# Patient Record
Sex: Male | Born: 1965
Health system: Southern US, Community
[De-identification: ages and names within clinical notes are randomized; demographics above are authoritative.]

## PROBLEM LIST (undated history)

## (undated) DIAGNOSIS — G35D Multiple sclerosis, unspecified: Secondary | ICD-10-CM

## (undated) DIAGNOSIS — G709 Myoneural disorder, unspecified: Secondary | ICD-10-CM

## (undated) DIAGNOSIS — G35 Multiple sclerosis: Secondary | ICD-10-CM

## (undated) DIAGNOSIS — E349 Endocrine disorder, unspecified: Secondary | ICD-10-CM

## (undated) DIAGNOSIS — Q9989 Other specified chromosome abnormalities: Secondary | ICD-10-CM

## (undated) DIAGNOSIS — R011 Cardiac murmur, unspecified: Secondary | ICD-10-CM

## (undated) DIAGNOSIS — Z9889 Other specified postprocedural states: Secondary | ICD-10-CM

## (undated) DIAGNOSIS — Q998 Other specified chromosome abnormalities: Secondary | ICD-10-CM

## (undated) DIAGNOSIS — G4730 Sleep apnea, unspecified: Secondary | ICD-10-CM

## (undated) HISTORY — DX: Other specified postprocedural states: Z98.89

## (undated) HISTORY — DX: Other specified chromosome abnormalities: Q99.89

## (undated) HISTORY — DX: Endocrine disorder, unspecified: E34.9

## (undated) HISTORY — DX: Cardiac murmur, unspecified: R01.1

## (undated) HISTORY — DX: Multiple sclerosis: G35

## (undated) HISTORY — DX: Multiple sclerosis, unspecified: G35.D

## (undated) HISTORY — DX: Other specified chromosome abnormalities: Q99.8

## (undated) HISTORY — DX: Sleep apnea, unspecified: G47.30

---
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
## 2006-05-04 ENCOUNTER — Ambulatory Visit: Payer: Self-pay | Admitting: Family Medicine

## 2010-04-24 ENCOUNTER — Ambulatory Visit
Admission: RE | Admit: 2010-04-24 | Discharge: 2010-04-24 | Payer: Self-pay | Source: Home / Self Care | Attending: Family Medicine | Admitting: Family Medicine

## 2011-10-06 ENCOUNTER — Ambulatory Visit (INDEPENDENT_AMBULATORY_CARE_PROVIDER_SITE_OTHER): Payer: BC Managed Care – PPO | Admitting: Family Medicine

## 2011-10-06 ENCOUNTER — Encounter: Payer: Self-pay | Admitting: Family Medicine

## 2011-10-06 VITALS — BP 110/60 | HR 76 | Ht 69.0 in | Wt 167.0 lb

## 2011-10-06 DIAGNOSIS — E291 Testicular hypofunction: Secondary | ICD-10-CM | POA: Insufficient documentation

## 2011-10-06 DIAGNOSIS — Z0000 Encounter for general adult medical examination without abnormal findings: Secondary | ICD-10-CM

## 2011-10-06 LAB — PSA: PSA: 0.41 ng/mL (ref <=4.00)

## 2011-10-06 LAB — COMPREHENSIVE METABOLIC PANEL
ALT: 10 U/L (ref 0–53)
AST: 13 U/L (ref 0–37)
Calcium: 9.4 mg/dL (ref 8.4–10.5)
Chloride: 104 mEq/L (ref 96–112)
Creat: 0.88 mg/dL (ref 0.50–1.35)
Potassium: 4.8 mEq/L (ref 3.5–5.3)

## 2011-10-06 LAB — CBC WITH DIFFERENTIAL/PLATELET
Basophils Absolute: 0 10*3/uL (ref 0.0–0.1)
Eosinophils Relative: 2 % (ref 0–5)
Lymphocytes Relative: 35 % (ref 12–46)
Neutro Abs: 3.6 10*3/uL (ref 1.7–7.7)
Platelets: 269 10*3/uL (ref 150–400)
RDW: 13.5 % (ref 11.5–15.5)
WBC: 6.9 10*3/uL (ref 4.0–10.5)

## 2011-10-06 LAB — LIPID PANEL
LDL Cholesterol: 135 mg/dL — ABNORMAL HIGH (ref 0–99)
Total CHOL/HDL Ratio: 4.7 Ratio
VLDL: 24 mg/dL (ref 0–40)

## 2011-10-06 LAB — TESTOSTERONE: Testosterone: 516.5 ng/dL (ref 300–890)

## 2011-10-06 LAB — HEMOCCULT GUIAC POC 1CARD (OFFICE)

## 2011-10-06 MED ORDER — TESTOSTERONE 20.25 MG/ACT (1.62%) TD GEL: 3.0000 | Freq: Every day | TRANSDERMAL | Status: DC

## 2011-10-06 NOTE — Progress Notes (Signed)
Subjective:    Patient ID: [name] [name], male    DOB: 06/29/1965, 46 y.o.   MRN: [id number]  HPI He is here for a complete examination. He presently is on testosterone replacement. He apparently had a workup at [facility]. There was a question of Klinefelter's syndrome over I do not have all the data present. They did a workup including pituitary and place him on testosterone replacement. Interestingly he has had bilateral hernia repair. He also gives a history for possible undescended testes on the left. He has no children. He has no other concerns or complaints. His social and family history were reviewed. His marriage is going well. He recently bought a bicycle and does plan to get more physically active.   Review of Systems  Constitutional: Negative.   HENT: Negative.   Eyes: Negative.   Respiratory: Negative.   Cardiovascular: Negative.   Gastrointestinal: Negative.   Genitourinary: Negative.   Musculoskeletal: Negative.   Skin: Negative.   Neurological: Negative.   Hematological: Negative.   Psychiatric/Behavioral: Negative.        Objective:   Physical Exam BP 110/60  Pulse 76  Ht 5\' 9"  (1.753 m)  Wt 167 lb (75.751 kg)  BMI 24.66 kg/m2  SpO2 98%  General Appearance:    Alert, cooperative, no distress, appears stated age  Head:    Normocephalic, without obvious abnormality, atraumatic  Eyes:    PERRL, conjunctiva/corneas clear, EOM's intact, fundi    benign  Ears:    Normal TM's and external ear canals  Nose:   Nares normal, mucosa normal, no drainage or sinus   tenderness  Throat:   Lips, mucosa, and tongue normal; teeth and gums normal  Neck:   Supple, no lymphadenopathy;  thyroid:  no   enlargement/tenderness/nodules; no carotid   bruit or JVD  Back:    Spine nontender, no curvature, ROM normal, no CVA     tenderness  Lungs:     Clear to auscultation bilaterally without wheezes, rales or     ronchi; respirations unlabored  Chest Wall:    No tenderness or  deformity   Heart:    Regular rate and rhythm, S1 and S2 normal, no murmur, rub   or gallop  Breast Exam:    No chest wall tenderness, masses or gynecomastia  Abdomen:     Soft, non-tender, nondistended, normoactive bowel sounds,    no masses, no hepatosplenomegaly  Genitalia:    Normal male external genitalia without lesions.  Testicles show questionable absence on the left and quite remarkable atrophy on the right.  No inguinal hernias.  Rectal:    Normal sphincter tone, no masses or tenderness; guaiac negative stool.  Prostate smooth, no nodules, not enlarged.  Extremities:   No clubbing, cyanosis or edema  Pulses:   2+ and symmetric all extremities  Skin:   Skin color, texture, turgor normal, no rashes or lesions  Lymph nodes:   Cervical, supraclavicular, and axillary nodes normal  Neurologic:   CNII-XII intact, normal strength, sensation and gait; reflexes 2+ and symmetric throughout          Psych:   Normal mood, affect, hygiene and grooming.           Assessment & Plan:   1. Routine general medical examination at a health care facility  CBC with Differential, Comprehensive metabolic panel, Lipid panel, Hemoccult - 1 Card (office)  2. Hypogonadism male  Testosterone, Testosterone (ANDROGEL PUMP) 20.25 MG/ACT (1.62%) GEL, PSA   I will  get a release and send it to [facility] to get a complete workup

## 2011-10-13 ENCOUNTER — Telehealth: Payer: Self-pay | Admitting: Family Medicine

## 2011-10-13 DIAGNOSIS — E291 Testicular hypofunction: Secondary | ICD-10-CM

## 2011-10-13 MED ORDER — TESTOSTERONE 20.25 MG/ACT (1.62%) TD GEL: 3.0000 | Freq: Every day | TRANSDERMAL | Status: DC

## 2011-10-13 NOTE — Telephone Encounter (Signed)
FAXED RX TO [facility]

## 2011-11-04 ENCOUNTER — Encounter: Payer: Self-pay | Admitting: Internal Medicine

## 2011-11-04 DIAGNOSIS — E349 Endocrine disorder, unspecified: Secondary | ICD-10-CM

## 2011-11-04 HISTORY — DX: Endocrine disorder, unspecified: E34.9

## 2012-04-14 HISTORY — PX: LAPAROSCOPIC CHOLECYSTECTOMY: SUR755

## 2012-08-30 ENCOUNTER — Encounter: Payer: Self-pay | Admitting: Family Medicine

## 2012-08-30 ENCOUNTER — Ambulatory Visit (INDEPENDENT_AMBULATORY_CARE_PROVIDER_SITE_OTHER): Payer: BC Managed Care – PPO | Admitting: Family Medicine

## 2012-08-30 VITALS — BP 110/60 | HR 68 | Ht 69.5 in | Wt 174.0 lb

## 2012-08-30 DIAGNOSIS — Z0000 Encounter for general adult medical examination without abnormal findings: Secondary | ICD-10-CM

## 2012-08-30 DIAGNOSIS — Z125 Encounter for screening for malignant neoplasm of prostate: Secondary | ICD-10-CM

## 2012-08-30 DIAGNOSIS — R29898 Other symptoms and signs involving the musculoskeletal system: Secondary | ICD-10-CM

## 2012-08-30 DIAGNOSIS — E291 Testicular hypofunction: Secondary | ICD-10-CM

## 2012-08-30 LAB — CBC WITH DIFFERENTIAL/PLATELET
Eosinophils Relative: 2 % (ref 0–5)
HCT: 41.1 % (ref 39.0–52.0)
Hemoglobin: 13.8 g/dL (ref 13.0–17.0)
Lymphocytes Relative: 30 % (ref 12–46)
Lymphs Abs: 2 10*3/uL (ref 0.7–4.0)
MCV: 87.3 fL (ref 78.0–100.0)
Monocytes Absolute: 0.9 10*3/uL (ref 0.1–1.0)
Monocytes Relative: 13 % — ABNORMAL HIGH (ref 3–12)
RBC: 4.71 MIL/uL (ref 4.22–5.81)
WBC: 6.9 10*3/uL (ref 4.0–10.5)

## 2012-08-30 NOTE — Progress Notes (Signed)
Subjective:    Patient ID: [name] [name], male    DOB: 28-Jul-1965, 47 y.o.   MRN: [id number]  HPI He is here for complete examination. His main complaint is having a one-year history of increasing difficulty with weakness in both lower is especially with exercise. When he stops exercising, the symptoms go away in roughly 5-10 minutes. He also states that heat will make him feel weak. He also notes that eating carbohydrates will cause a tingling sensation in his right leg. He's had no chest pain, shortness of breath, numbness. He does have an underlying history of hypogonadism and was evaluated at [facility]inology. There is a question of XY Y. Chromosome abnormality. Review of records shows no evidence of that. Otherwise he has no concerns or complaints.   Review of Systems  Constitutional: Positive for activity change and fatigue.  HENT: Negative.   Eyes: Negative.   Respiratory: Negative.   Cardiovascular: Negative.   Gastrointestinal: Negative.   Endocrine: Negative.   Genitourinary: Negative.   Allergic/Immunologic: Negative.   Neurological: Negative.   Hematological: Negative.   Psychiatric/Behavioral: Negative.        Objective:   Physical Exam BP 110/60  Pulse 68  Ht 5' 9.5" (1.765 m)  Wt 174 lb (78.926 kg)  BMI 25.34 kg/m2  General Appearance:    Alert, cooperative, no distress, appears stated age  Head:    Normocephalic, without obvious abnormality, atraumatic  Eyes:    PERRL, conjunctiva/corneas clear, EOM's intact, fundi    benign  Ears:    Normal TM's and external ear canals  Nose:   Nares normal, mucosa normal, no drainage or sinus   tenderness  Throat:   Lips, mucosa, and tongue normal; teeth and gums normal  Neck:   Supple, no lymphadenopathy;  thyroid:  no   enlargement/tenderness/nodules; no carotid   bruit or JVD  Back:    Spine nontender, no curvature, ROM normal, no CVA     tenderness  Lungs:     Clear to auscultation bilaterally without  wheezes, rales or     ronchi; respirations unlabored  Chest Wall:    No tenderness or deformity   Heart:    Regular rate and rhythm, S1 and S2 normal, no murmur, rub   or gallop  Breast Exam:    No chest wall tenderness, masses or gynecomastia  Abdomen:     Soft, non-tender, nondistended, normoactive bowel sounds,    no masses, no hepatosplenomegaly  Genitalia:    Normal male external genitalia without lesions.  Testicles are quite atrophied.  No inguinal hernias.  Rectal:    Normal sphincter tone, no masses or tenderness; guaiac negative stool.  Prostate smooth, no nodules, not enlarged.  Extremities:   No clubbing, cyanosis or edema  Pulses:   2+ and symmetric all extremities  Skin:   Skin color, texture, turgor normal, no rashes or lesions  Lymph nodes:   Cervical, supraclavicular, and axillary nodes normal  Neurologic:   CNII-XII intact, normal strength, sensation and gait; reflexes 2+ and symmetric throughout          Psych:   Normal mood, affect, hygiene and grooming.          Assessment & Plan:  Routine general medical examination at a health care facility - Plan: CBC with Differential, Comprehensive metabolic panel, Lipid panel, Fecal Occult Blood, Guaiac, CANCELED: POCT urinalysis dipstick  Hypogonadism male - Plan: Testosterone, PSA  Weakness of both legs - Plan: TSH  Special screening for  malignant neoplasm of prostate - Plan: PSA L. Get a release form to look for chromosome evaluation. Routine blood screening. May possibly need to do further vascular workup.

## 2012-08-31 ENCOUNTER — Other Ambulatory Visit: Payer: Self-pay

## 2012-08-31 DIAGNOSIS — R29898 Other symptoms and signs involving the musculoskeletal system: Secondary | ICD-10-CM

## 2012-08-31 LAB — LIPID PANEL
Cholesterol: 207 mg/dL — ABNORMAL HIGH (ref 0–200)
Triglycerides: 107 mg/dL (ref <150)
VLDL: 21 mg/dL (ref 0–40)

## 2012-08-31 LAB — COMPREHENSIVE METABOLIC PANEL
ALT: 14 U/L (ref 0–53)
Albumin: 4.5 g/dL (ref 3.5–5.2)
CO2: 29 mEq/L (ref 19–32)
Chloride: 105 mEq/L (ref 96–112)
Glucose, Bld: 88 mg/dL (ref 70–99)
Potassium: 4.7 mEq/L (ref 3.5–5.3)
Sodium: 141 mEq/L (ref 135–145)
Total Protein: 7 g/dL (ref 6.0–8.3)

## 2012-08-31 LAB — TSH: TSH: 1.791 u[IU]/mL (ref 0.350–4.500)

## 2012-08-31 LAB — PSA: PSA: 0.41 ng/mL (ref <=4.00)

## 2012-08-31 NOTE — Progress Notes (Signed)
Quick Note:  PT INFORMED OF LABS AND THAT I HAVE FAXED OVER REQUEST FOR BI/LAT LOWER EXTREMITY ARTERIAL DOPPLER ALONG WITH U/S OF LOWER AORTA AND ILIACS ______

## 2012-09-01 ENCOUNTER — Encounter: Payer: Self-pay | Admitting: Cardiovascular Disease

## 2012-09-01 ENCOUNTER — Encounter (HOSPITAL_COMMUNITY): Payer: Self-pay | Admitting: Cardiovascular Disease

## 2012-09-02 ENCOUNTER — Encounter: Payer: Self-pay | Admitting: Family Medicine

## 2012-09-02 ENCOUNTER — Other Ambulatory Visit (HOSPITAL_COMMUNITY): Payer: Self-pay | Admitting: Family Medicine

## 2012-09-10 ENCOUNTER — Encounter (HOSPITAL_COMMUNITY): Payer: BC Managed Care – PPO

## 2012-09-17 ENCOUNTER — Ambulatory Visit (HOSPITAL_COMMUNITY)
Admission: RE | Admit: 2012-09-17 | Discharge: 2012-09-17 | Disposition: A | Discharge status: Home / Self Care | Payer: BC Managed Care – PPO | Source: Ambulatory Visit | Attending: Cardiology | Admitting: Cardiology

## 2012-09-17 DIAGNOSIS — R29898 Other symptoms and signs involving the musculoskeletal system: Secondary | ICD-10-CM | POA: Insufficient documentation

## 2012-09-17 NOTE — Progress Notes (Signed)
Arterial Duplex Completed. Negative. Erlene Quan

## 2012-10-12 DIAGNOSIS — G709 Myoneural disorder, unspecified: Secondary | ICD-10-CM

## 2012-10-12 HISTORY — DX: Myoneural disorder, unspecified: G70.9

## 2012-10-26 ENCOUNTER — Ambulatory Visit (INDEPENDENT_AMBULATORY_CARE_PROVIDER_SITE_OTHER): Payer: BC Managed Care – PPO | Admitting: Family Medicine

## 2012-10-26 ENCOUNTER — Encounter: Payer: Self-pay | Admitting: Family Medicine

## 2012-10-26 ENCOUNTER — Other Ambulatory Visit: Payer: Self-pay

## 2012-10-26 VITALS — BP 116/70 | HR 80 | Temp 98.3°F

## 2012-10-26 DIAGNOSIS — E291 Testicular hypofunction: Secondary | ICD-10-CM

## 2012-10-26 DIAGNOSIS — R42 Dizziness and giddiness: Secondary | ICD-10-CM

## 2012-10-26 MED ORDER — TESTOSTERONE 20.25 MG/ACT (1.62%) TD GEL: 3.0000 | Freq: Every day | TRANSDERMAL | Status: DC

## 2012-10-26 MED ORDER — MECLIZINE HCL 12.5 MG PO TABS: 12.5000 mg | ORAL_TABLET | Freq: Three times a day (TID) | ORAL | Status: DC | PRN

## 2012-10-26 NOTE — Progress Notes (Signed)
  Subjective:    Patient ID: [name] [name], male    DOB: 1965/09/29, 47 y.o.   MRN: [id number]  HPI Yesterday he was feeling weak and dizzy he was able to go to work. He had these symptoms all day long. Today he woke up with more dizziness and weakness with decreased ability to focus. No blurred vision, double vision, nausea or vomiting, weakness. No fever, chills, sore throat, nasal congestion or PND.   He would also like his testosterone renewed. The only medicine he takes is testosterone. He has had no recent sickness this.  Review of Systems     Objective:   Physical Exam alert and in no distress. EOMI cerebellar testing did cause him to fall slightly to the right. DTRs normal. No clonus. Negative pronator drift with normal finger to nose Tympanic membranes and canals are normal. Throat is clear. Tonsils are normal. Neck is supple without adenopathy or thyromegaly. Cardiac exam shows a regular sinus rhythm without murmurs or gallops. Lungs are clear to auscultation.        Assessment & Plan:  Dizziness - Plan: meclizine (ANTIVERT) 12.5 MG tablet, DISCONTINUED: meclizine (ANTIVERT) 12.5 MG tablet  Hypogonadism male - Plan: Testosterone (ANDROGEL PUMP) 20.25 MG/ACT (1.62%) GEL  I will place him on Antivert. He does have only one sign lateralizing. I warned him that if his symptoms worsen, he is to call me.

## 2012-10-26 NOTE — Patient Instructions (Signed)
Use the Antivert as needed but if your symptoms change especially with increased dizziness nausea, vomiting or headache I want to know,

## 2012-10-28 ENCOUNTER — Telehealth: Payer: Self-pay | Admitting: Family Medicine

## 2012-10-28 NOTE — Telephone Encounter (Signed)
Calling him up until and if he is feeling like this in the morning to call and make an appointment to be seen.

## 2012-10-28 NOTE — Telephone Encounter (Signed)
PT INFOMED IF FEELING BAD IN THE MORNING CALL AND GET AN APPOINTMENT

## 2012-11-02 ENCOUNTER — Ambulatory Visit (INDEPENDENT_AMBULATORY_CARE_PROVIDER_SITE_OTHER): Payer: BC Managed Care – PPO | Admitting: Family Medicine

## 2012-11-02 ENCOUNTER — Encounter: Payer: Self-pay | Admitting: Family Medicine

## 2012-11-02 VITALS — BP 122/80 | HR 70

## 2012-11-02 DIAGNOSIS — H532 Diplopia: Secondary | ICD-10-CM

## 2012-11-02 DIAGNOSIS — R42 Dizziness and giddiness: Secondary | ICD-10-CM

## 2012-11-02 NOTE — Progress Notes (Signed)
  Subjective:    Patient ID: [name] [name], male    DOB: [birth year], 47 y.o.   MRN: [id number]  HPI He is here for recheck. He continues had difficulty with dizziness and now also complains of double vision. Prior to this he did have blurred vision. He does have difficulty with nausea but does not note that he falls in one direction or another.   Review of Systems     Objective:   Physical Exam Alert and slightly toxic appearing. EOMI. Other cranial nerves grossly intact. DTRs of upper stream these were 2+ and lower sternum these were 3+ with 2 beat clonus. Cerebellar testing showed him to be unsteady but he did not fall in one direction. Normal finger to nose.       Assessment & Plan:  Vertigo - Plan: MR Brain W Wo Contrast  Double vision - Plan: MR Brain W Wo Contrast  case was discussed with radiology. I think we need to look closely at his sella and the optic chiasm and the appropriate MRI was therefore ordered.

## 2012-11-03 ENCOUNTER — Ambulatory Visit
Admission: RE | Admit: 2012-11-03 | Discharge: 2012-11-03 | Disposition: A | Discharge status: Home / Self Care | Payer: BC Managed Care – PPO | Source: Ambulatory Visit | Attending: Family Medicine | Admitting: Family Medicine

## 2012-11-03 ENCOUNTER — Telehealth: Payer: Self-pay | Admitting: Internal Medicine

## 2012-11-03 ENCOUNTER — Encounter (HOSPITAL_COMMUNITY): Payer: Self-pay | Admitting: General Practice

## 2012-11-03 ENCOUNTER — Observation Stay (HOSPITAL_COMMUNITY)
Admission: AD | Admit: 2012-11-03 | Discharge: 2012-11-05 | Disposition: A | Discharge status: Home / Self Care | Payer: BC Managed Care – PPO | Source: Ambulatory Visit | Attending: Internal Medicine | Admitting: Internal Medicine

## 2012-11-03 DIAGNOSIS — G35 Multiple sclerosis: Principal | ICD-10-CM | POA: Diagnosis present

## 2012-11-03 DIAGNOSIS — E291 Testicular hypofunction: Secondary | ICD-10-CM

## 2012-11-03 DIAGNOSIS — I951 Orthostatic hypotension: Secondary | ICD-10-CM

## 2012-11-03 DIAGNOSIS — H538 Other visual disturbances: Secondary | ICD-10-CM | POA: Insufficient documentation

## 2012-11-03 DIAGNOSIS — H532 Diplopia: Secondary | ICD-10-CM

## 2012-11-03 DIAGNOSIS — R42 Dizziness and giddiness: Secondary | ICD-10-CM

## 2012-11-03 DIAGNOSIS — H8110 Benign paroxysmal vertigo, unspecified ear: Secondary | ICD-10-CM

## 2012-11-03 HISTORY — DX: Myoneural disorder, unspecified: G70.9

## 2012-11-03 LAB — COMPREHENSIVE METABOLIC PANEL
AST: 16 U/L (ref 0–37)
BUN: 22 mg/dL (ref 6–23)
CO2: 29 mEq/L (ref 19–32)
Calcium: 9.5 mg/dL (ref 8.4–10.5)
Chloride: 101 mEq/L (ref 96–112)
Creatinine, Ser: 0.76 mg/dL (ref 0.50–1.35)
GFR calc Af Amer: >90 mL/min (ref >90)
GFR calc non Af Amer: >90 mL/min (ref >90)
Total Bilirubin: 0.4 mg/dL (ref 0.3–1.2)

## 2012-11-03 LAB — TSH: TSH: 1.488 u[IU]/mL (ref 0.350–4.500)

## 2012-11-03 LAB — CBC
Hemoglobin: 14 g/dL (ref 13.0–17.0)
MCH: 29.7 pg (ref 26.0–34.0)
Platelets: 230 10*3/uL (ref 150–400)
RBC: 4.71 MIL/uL (ref 4.22–5.81)
WBC: 8.6 10*3/uL (ref 4.0–10.5)

## 2012-11-03 LAB — HEMOGLOBIN A1C
Hgb A1c MFr Bld: 5.3 % (ref <5.7)
Mean Plasma Glucose: 105 mg/dL (ref <117)

## 2012-11-03 MED ORDER — ONDANSETRON HCL 4 MG/2ML IJ SOLN: 4.0000 mg | Freq: Four times a day (QID) | INTRAMUSCULAR | Status: DC | PRN

## 2012-11-03 MED ORDER — ONDANSETRON HCL 4 MG PO TABS: 4.0000 mg | ORAL_TABLET | Freq: Four times a day (QID) | ORAL | Status: DC | PRN

## 2012-11-03 MED ORDER — ALUM & MAG HYDROXIDE-SIMETH 200-200-20 MG/5ML PO SUSP: 30.0000 mL | Freq: Four times a day (QID) | ORAL | Status: DC | PRN

## 2012-11-03 MED ORDER — ALUM & MAG HYDROXIDE-SIMETH 200-200-20 MG/5ML PO SUSP
30.0000 mL | Freq: Four times a day (QID) | ORAL | Status: DC | PRN
  Filled 2012-11-03: qty 30

## 2012-11-03 MED ORDER — ACETAMINOPHEN 650 MG RE SUPP: 650.0000 mg | Freq: Four times a day (QID) | RECTAL | Status: DC | PRN

## 2012-11-03 MED ORDER — SODIUM CHLORIDE 0.9 % IV SOLN: 250.0000 mL | INTRAVENOUS | Status: DC | PRN

## 2012-11-03 MED ORDER — ACETAMINOPHEN 325 MG PO TABS: 650.0000 mg | ORAL_TABLET | Freq: Four times a day (QID) | ORAL | Status: DC | PRN

## 2012-11-03 MED ORDER — SODIUM CHLORIDE 0.9 % IJ SOLN
3.0000 mL | Freq: Two times a day (BID) | INTRAMUSCULAR | Status: DC
  Administered 2012-11-03 – 2012-11-05 (×3): 3 mL via INTRAVENOUS

## 2012-11-03 MED ORDER — TRAZODONE 25 MG HALF TABLET
25.0000 mg | ORAL_TABLET | Freq: Every evening | ORAL | Status: DC | PRN
  Filled 2012-11-03: qty 1

## 2012-11-03 MED ORDER — SENNOSIDES-DOCUSATE SODIUM 8.6-50 MG PO TABS
1.0000 | ORAL_TABLET | Freq: Every evening | ORAL | Status: DC | PRN
  Filled 2012-11-03: qty 1

## 2012-11-03 MED ORDER — TESTOSTERONE 20.25 MG/ACT (1.62%) TD GEL: 3.0000 | Freq: Every day | TRANSDERMAL | Status: DC

## 2012-11-03 MED ORDER — TESTOSTERONE 50 MG/5GM (1%) TD GEL: 5.0000 g | Freq: Every day | TRANSDERMAL | Status: DC

## 2012-11-03 MED ORDER — TESTOSTERONE 20.25 MG/ACT (1.62%) TD GEL
3.0000 | Freq: Every day | TRANSDERMAL | Status: DC
  Administered 2012-11-04 – 2012-11-05 (×2): 3 via TRANSDERMAL

## 2012-11-03 MED ORDER — SODIUM CHLORIDE 0.9 % IJ SOLN: 3.0000 mL | INTRAMUSCULAR | Status: DC | PRN

## 2012-11-03 MED ORDER — SODIUM CHLORIDE 0.9 % IJ SOLN: 3.0000 mL | Freq: Two times a day (BID) | INTRAMUSCULAR | Status: DC

## 2012-11-03 MED ORDER — GADOBENATE DIMEGLUMINE 529 MG/ML IV SOLN
16.0000 mL | Freq: Once | INTRAVENOUS | Status: AC | PRN
  Administered 2012-11-03: 16 mL via INTRAVENOUS

## 2012-11-03 MED ORDER — DIAZEPAM 2 MG PO TABS: 1.0000 mg | ORAL_TABLET | Freq: Four times a day (QID) | ORAL | Status: DC | PRN

## 2012-11-03 NOTE — H&P (Signed)
Addendum  Patient seen and examined, chart and data base reviewed.  I agree with the above assessment and plan.  For full details please see Mrs. Algis Downs PA note.  Dizziness, double/blurry vision with MRI findings consistent with MS, Neuro to see.  PT to evaluate the patient for her significant dizziness.   Clint Lipps, MD [facility] Pager: [phone] 11/03/2012, 5:15 PM

## 2012-11-03 NOTE — Progress Notes (Signed)
Quick Note:  I informed the patient of the diagnosis. Discussed a direct admit with Dr. Arthor Captain who will accept him. Call admission this and asked for a telemetry bed and he will be admitted to Dr. Harriet Pho service. ______

## 2012-11-03 NOTE — Telephone Encounter (Signed)
PENDING ACCEPTANCE TRANFER NOTE:  Call received from:    Sharlot Gowda  REASON FOR REQUESTING TRANSFER:   MS flare  HPI:   Presented to his primary care physician office with blurry and double vision. MRI was done and showed findings consistent with chronic MS, patient does not have history of MS (or at least diagnosed multiple sclerosis). Patient will be admitted for MS flareup, likely will need a neurology consultation on admission.   PLAN:  According to telephone report, this patient was accepted for transfer to [facility],   Under [facility] team:  10,  I have requested an order be written to call Flow Manager at [phone] upon patient arrival to the floor for final physician assignment who will do the admission and give admitting orders.  SIGNED: Clint Lipps, MD [facility]  11/03/2012, 11:26 AM

## 2012-11-03 NOTE — H&P (Signed)
[facility] History and Physical  [name] [name] XBJ:[id number] DOB: 1965/06/12 DOA: 11/03/2012  Referring physician: Dr. Susann Givens PCP: Carollee Herter, MD  Specialists:   Chief Complaint: Double vision  HPI: [name] [name] is a 47 y.o. male with a history of low testosterone and a questionable history of hyperglycemia.  He gives a history of intermittent weakness over the past 12 years.  This weakness would resolve with a brief 5 - 10 min rest.  Approximately 1 year ago he was helping to unload a moving truck and noticed that his extremities would collapse but would return to normal with a brief rest.  Over the past two weeks he has felt abnormal.  He has developed right leg intermittent twitching that is worse after he eats, he felt very weak, and his vision became blurry.  He saw his PCP who started him on meclizine.  On Friday afternoon 7/18 he began to have double vision and worsening blurry vision.  He became too nauseated to walk.  He was seen again in his PCP's office on 7/22 and MRI was ordered.  The MRI shows chonic multiple sclerosis.  His PCP called for direct admission.    Review of Systems: + for decreased appetite, + for shortness of breath (the feeling of being unable to take a deep breath).  Denies numbness, denies bowel or bladder incontinence, He denies CP, AP, Vomiting, diarrhea, fever, cough, congestion, lymphadenopathy, night sweats.  All other systems reviewed and found to be negative.  Past Medical History  Diagnosis Date  . Testosterone deficiency 11/04/11  . 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 Other postprocedural status(V45.89)     inguinal herniorrhaphies bilateral    No past surgical history on file. Social History:  reports that he quit smoking about 2 years ago. His smoking use included Cigarettes. He smoked 0.00 packs per day. He does not have any smokeless tobacco history on file. He reports that he does not drink  alcohol or use illicit drugs. 30 pack year smoking history.  Quit 2 years ago.  Rare alcohol, has not used recreational drugs since he was in his 55s.  Married, independent of his ADLs.  Allergies  Allergen Reactions  . Penicillins     Full body rash    Mother:  Healthy and in her 21. Father pmh unknown (he lives in [place]).  No family hx of MS, IBD, DM  Prior to Admission medications   Medication Sig Start Date End Date Taking? Authorizing Provider  Testosterone (ANDROGEL PUMP) 20.25 MG/ACT (1.62%) GEL Place 3 Squirts onto the skin daily. 10/26/12  Yes Ronnald Nian, MD   Physical Exam: Filed Vitals:   11/03/12 1300  BP: 112/67  Pulse: 78  Temp: 98 F (36.7 C)  TempSrc: Oral  Resp: 20  SpO2: 98%     General:  Wn, Wd, Male, NAD sitting up in bed.  Eyes: sclera clear, pupils equal and round  ENT: mmm, no exudates, or erythema  Neck: supple, no lymphadenopathy  Cardiovascular: rrr no m/r/g  Respiratory: cta no w/c/r, no accessory muscle use.  Abdomen: thin, soft, nt, nd, +BS  Skin: no rash, bruise, lesion  Musculoskeletal: 5/5 strength in each extremity  Psychiatric: A&O, NAD, Cooperative, affect normal  Neurologic:  Defer to neuro PA    Radiological Exams on Admission: Mr [name] Contrast  11/03/2012   *RADIOLOGY REPORT*  Clinical Data: 47 year old male with diplopia, nausea, headache, dizziness.  MRI HEAD WITHOUT AND WITH CONTRAST  Technique:  Multiplanar, multiecho pulse sequences of the brain and surrounding structures were obtained according to standard protocol without and with intravenous contrast  Contrast: 16mL MULTIHANCE GADOBENATE DIMEGLUMINE 529 MG/ML IV SOLN  Comparison: None.  Findings: Extensive cerebral white matter signal abnormality, with widespread areas of small cystic encephalomalacia.  Sagittal FLAIR images demonstrate lesions generally arranged in a perpendicular pattern to the ventricles.  Thinning of the corpus callosum. Bilateral temporal  lobe involvement.  Scattered involvement of the deep gray matter nuclei, more so the thalami.  Left middle cerebellar peduncle involvement.  Subtle right cerebellar hemisphere involvement.  Dorsal medulla oblongata involvement. Grossly negative visualized cervical spine.  Diffusion weighted images and postcontrast imaging do not demonstrate any enhancing lesions, or lesions with definite restricted diffusion (although a small left lateral thalamic lesion could be mildly restricted height and series 4 image 17 and series 400 image 17).  No areas of cortical encephalomalacia are identified. No restricted diffusion to suggest acute infarction.  No acute or chronic intracranial hemorrhage identified.  Negative pituitary. Major intracranial vascular flow voids are preserved.  Normal bone marrow signal. Visualized orbit soft tissues are within normal limits.  Visualized paranasal sinuses and mastoids are clear.  Grossly normal visualized internal auditory structures. Negative scalp soft tissues.  IMPRESSION: Advanced white matter disease with superimposed changes in the cerebellum and deep gray matter nuclei.  The pattern of disease is most consistent with chronic multiple sclerosis.  No definite acute demyelination.  No other intracranial abnormality identified.   Original Report Authenticated By: Erskine Speed, M.D.    Assessment/Plan Active Problems:   Multiple sclerosis   Multiple sclerosis exacerbation.  Per MRI patient has chronic MS, although this is a new diagnosis for him.  Neuro consult appreciated.  Will admit to observation  Check urine, TSH, and CXR (due to SOB)  Question of hyperglycemia in the past  Check Hgb A1c  Low Testosterone  Being treated as an outpatient  Continue testosterone  Neurology formally consulted.  Code Status: full Family Communication: wife, Brayton Caves, at bedside. Disposition Plan: Observation, likely home 7/24 with neuro follow up.  Time spent: 60  min  Conley Canal [facility] Pager [phone]  If 7PM-7AM, please contact night-coverage [email or web address] Password [facility] 11/03/2012, 3:17 PM

## 2012-11-03 NOTE — Consult Note (Signed)
NEURO HOSPITALIST CONSULT NOTE    Reason for Consult: MS  HPI:                                                                                                                                          [name] [name] is an 47 y.o. male with a new MS diagnosis.  He has a 12 year history of waxing and waning weakness of bilateral legs that resolved with rest and fluids. He has also had right leg abnormal sensations that resolved with exercise. Over the 12 years he had noted periods of generalized weakness. He presented 2 weeks ago, feeling "abnormal" and dizzy.  He saw his PCP, who thought he had swimmers ear and started him on Meclizine, which the patient discontinued because it was not working. He developed diplopia and mild blurry vision in the last week.  His PCP ordered an MRI with contrast today, which showed multiple periventricular lesions consistent with chronic MS, but no enhancing lesions. His PCP called for a direct admission. The patient is still having symptoms of blurry vision and dizziness today. He describes a vertiginous sensation with movement that fatigues and ceases when movement stops.    Past Medical History  Diagnosis Date  . Testosterone deficiency 11/04/11  . 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 Other postprocedural status(V45.89)     inguinal herniorrhaphies bilateral      Social History: 30 pack year history. Quit smoking 2 years ago.  Rarely drinks alcohol and does not use recreational drugs. Lives at home with wife.    Allergies  Allergen Reactions  . Penicillins     unknown    MEDICATIONS:                                                                                                                     Current Facility-Administered Medications  Medication Dose Route Frequency Provider Last Rate Last Dose  . Testosterone 20.25 MG/ACT (1.62%) GEL 3 Squirt  3 Squirt Transdermal Daily Marianne L York, PA-C          ROS:  History obtained from the patient  General ROS: negative for - chills, fatigue, fever, night sweats, weight gain or weight loss Psychological ROS: negative for - behavioral disorder, hallucinations, memory difficulties, mood swings or suicidal ideation Ophthalmic ROS: negative for -eye pain or loss of vision ENT ROS: negative for - tinnitus  Allergy and Immunology ROS: negative for - hives or itchy/watery eyes Hematological and Lymphatic ROS: negative for - bleeding problems, bruising or swollen lymph nodes Endocrine ROS: negative for - galactorrhea, hair pattern changes, polydipsia/polyuria or temperature intolerance Respiratory ROS: negative for - cough, hemoptysis, shortness of breath or wheezing Cardiovascular ROS: negative for - chest pain, dyspnea on exertion, edema or irregular heartbeat Gastrointestinal ROS: negative for - abdominal pain, diarrhea, hematemesis, or stool incontinence Genito-Urinary ROS: negative for - dysuria, hematuria, incontinence or urinary frequency/urgency Musculoskeletal ROS: negative for - joint swelling or muscular weakness Neurological ROS: as noted in HPI Dermatological ROS: negative for rash and skin lesion changes   Blood pressure 112/67, pulse 78, temperature 98 F (36.7 C), temperature source Oral, resp. rate 20, SpO2 98.00%.   Neurologic Examination:                                                                                                       Mental Status: Alert, oriented, thought content appropriate.  Speech fluent without evidence of aphasia.  Able to follow 3 step commands without difficulty. Cranial Nerves: II: Discs flat bilaterally; Visual fields grossly normal, pupils equal, round, reactive to light and accommodation III,IV, VI: ptosis not present, extra-ocular motions intact  bilaterally V,VII: smile symmetric, facial light touch sensation normal bilaterally VIII: hearing normal bilaterally IX,X: gag reflex present XI: bilateral shoulder shrug XII: midline tongue extension Motor: Right : Upper extremity   5/5    Left:     Upper extremity   5/5  Lower extremity   5/5 (note)    Lower extremity   5/5  --right knee flexion 4/5  Tone and bulk:normal tone throughout; no atrophy noted Sensory: Pinprick and light touch intact throughout, bilaterally Deep Tendon Reflexes:  Right: Upper Extremity   Left: Upper extremity   biceps (C-5 to C-6) 2/4   biceps (C-5 to C-6) 2/4 tricep (C7) 2/4    triceps (C7) 2/4 Brachioradialis (C6) 2/4  Brachioradialis (C6) 2/4  Lower Extremity Lower Extremity  quadriceps (L-2 to L-4) 2/4   quadriceps (L-2 to L-4) 2/4 Achilles (S1) 2/4   Achilles (S1) 2/4  Plantars: Right: upgoing   Left: downgoing Cerebellar: normal finger-to-nose,  normal heel-to-shin test Gait: narrow based, negative Romberg  CV: pulses palpable throughout     Lab Results  Component Value Date/Time   CHOL 207* 08/30/2012 11:17 AM    No results found for this or any previous visit (from the past 48 hour(s)).  Mr [name] [name] Wo Contrast  11/03/2012   *RADIOLOGY REPORT*  Clinical Data: 47 year old male with diplopia, nausea, headache, dizziness.  MRI HEAD WITHOUT AND WITH CONTRAST  Technique:  Multiplanar, multiecho pulse sequences of the brain and surrounding structures were obtained according to standard protocol  without and with intravenous contrast  Contrast: 16mL MULTIHANCE GADOBENATE DIMEGLUMINE 529 MG/ML IV SOLN  Comparison: None.  Findings: Extensive cerebral white matter signal abnormality, with widespread areas of small cystic encephalomalacia.  Sagittal FLAIR images demonstrate lesions generally arranged in a perpendicular pattern to the ventricles.  Thinning of the corpus callosum. Bilateral temporal lobe involvement.  Scattered involvement of the deep  gray matter nuclei, more so the thalami.  Left middle cerebellar peduncle involvement.  Subtle right cerebellar hemisphere involvement.  Dorsal medulla oblongata involvement. Grossly negative visualized cervical spine.  Diffusion weighted images and postcontrast imaging do not demonstrate any enhancing lesions, or lesions with definite restricted diffusion (although a small left lateral thalamic lesion could be mildly restricted height and series 4 image 17 and series 400 image 17).  No areas of cortical encephalomalacia are identified. No restricted diffusion to suggest acute infarction.  No acute or chronic intracranial hemorrhage identified.  Negative pituitary. Major intracranial vascular flow voids are preserved.  Normal bone marrow signal. Visualized orbit soft tissues are within normal limits.  Visualized paranasal sinuses and mastoids are clear.  Grossly normal visualized internal auditory structures. Negative scalp soft tissues.  IMPRESSION: Advanced white matter disease with superimposed changes in the cerebellum and deep gray matter nuclei.  The pattern of disease is most consistent with chronic multiple sclerosis.  No definite acute demyelination.  No other intracranial abnormality identified.   Original Report Authenticated By: Erskine Speed, M.D.     Assessment/Plan:  47 yo male with 12 year history of waxing and waning symptoms of fatigue and right leg weakness. Patient presented to PCP due to vertigo. MRI with contrast of brain was obtained and showed advanced white matter disease with superimposed changes in the cerebellum and deep gray matter nuclei, likely manifestations of multiple sclerosis.  Recommend;  1. Diazepam 1 mg Q6H PRN for vertigo 2. Vestibular rehab while in hospital 3. Will defer her treatment with IV steroids, as patient has no acute way disabling deficit 4. Outpatient follow up with Neurology for MRI findings  Felicie Morn PA-C [facility] [phone]  I  personally participate in this patient's evaluation and management, including formulating the above clinical assessment and management recommendations.  Venetia Maxon M.D. [facility] [phone]  11/03/2012, 3:12 PM

## 2012-11-03 NOTE — Progress Notes (Signed)
11/03/2012 patient was a direct admit from his MD office to 6700. He arrive on the floor at 1346.  He is alert, oriented and ambulatory. Patient stated he was dizzy, little weak and having double vision. MD was call when patient arrive to unit. Patient skin is fine, only a scratch area on his left thigh from a prior spider bite. [facility] RN.

## 2012-11-03 NOTE — Progress Notes (Signed)
Quick Note:  PT WAS ADVISED TO GO TO [facility] AND GO TO ADMITTING THEY HAVE BED READY PT STATED HE WAS CALLING HIS WIFE TO COME GET HIM ______

## 2012-11-03 NOTE — Evaluation (Signed)
Occupational Therapy Evaluation Patient Details Name: [name] [name] MRN: [id number] DOB: [birth year] Today's Date: 11/03/2012 Time: 4098-1191 OT Time Calculation (min): 23 min  OT Assessment / Plan / Recommendation History of present illness [name] [name] is a 47 y.o. male with a history of low testosterone and a questionable history of hyperglycemia.  He gives a history of intermittent weakness over the past 12 years.  This weakness would resolve with a brief 5 - 10 min rest.  Approximately 1 year ago he was helping to unload a moving truck and noticed that his extremities would collapse but would return to normal with a brief rest.  Over the past two weeks he has felt abnormal.  He has developed right leg intermittent twitching that is worse after he eats, he felt very weak, and his vision became blurry.  He saw his PCP who started him on meclizine.  On Friday afternoon 7/18 he began to have double vision and worsening blurry vision.  He became too nauseated to walk.  He was seen again in his PCP's office on 7/22 and MRI was ordered.  The MRI shows chonic multiple sclerosis.  His PCP called for direct admission.     Clinical Impression   Pt admitted with above. Pt currently with functional limitations due to the deficits listed below (see OT Problem List). Pt will benefit from skilled OT to increase their safety and independence with ADL and functional mobility for ADL to facilitate discharge to venue listed below.       OT Assessment  Patient needs continued OT Services    Follow Up Recommendations  Outpatient OT       Equipment Recommendations  None recommended by OT    Recommendations for Other Services    Frequency  Min 2X/week    Precautions / Restrictions Precautions Precaution Comments: dizziness with movement Restrictions Weight Bearing Restrictions: No       ADL  Transfers/Ambulation Related to ADLs: Mod I with compensatory head/body turning stratgies ADL Comments: Pt  can perform these tasks at a mod I level as long as he uses compensation techiniques of focusing on a target when he moves, not looking around while he is walking, and only looking in one direction while bathing dressing--otherwise he becomes dizzy. Needs VCs to do remember to do this. He also is dealing with double vision Pt informed that he should not drive while he is experiencing dizziness or double vision.    OT Diagnosis: Disturbance of vision  OT Problem List: Impaired vision/perception OT Treatment Interventions: Therapeutic activities   OT Goals(Current goals can be found in the care plan section) Acute Rehab OT Goals OT Goal Formulation: With patient Time For Goal Achievement: 11/10/12 Potential to Achieve Goals: Good  Visit Information  Last OT Received On: 11/03/12 Assistance Needed: +1 History of Present Illness: [name] [name] is a 47 y.o. male with a history of low testosterone and a questionable history of hyperglycemia.  He gives a history of intermittent weakness over the past 12 years.  This weakness would resolve with a brief 5 - 10 min rest.  Approximately 1 year ago he was helping to unload a moving truck and noticed that his extremities would collapse but would return to normal with a brief rest.  Over the past two weeks he has felt abnormal.  He has developed right leg intermittent twitching that is worse after he eats, he felt very weak, and his vision became blurry.  He saw his PCP who started him  on meclizine.  On Friday afternoon 7/18 he began to have double vision and worsening blurry vision.  He became too nauseated to walk.  He was seen again in his PCP's office on 7/22 and MRI was ordered.  The MRI shows chonic multiple sclerosis.  His PCP called for direct admission.         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         Vision/Perception Vision - History Baseline Vision: Wears glasses for distance only Patient Visual Report: Diplopia;Blurring of vision (Diplopia to his with eye shift to left (upper, middle, and  ) Vision - Assessment Eye Alignment: Impaired (comment)   Cognition  Cognition Arousal/Alertness: Awake/alert Behavior During Therapy: WFL for tasks assessed/performed Overall Cognitive Status: Within Functional Limits for tasks assessed    Extremity/Trunk Assessment Upper Extremity Assessment Upper Extremity Assessment: Overall WFL for tasks assessed Lower Extremity Assessment Lower Extremity Assessment: Defer to PT evaluation     Mobility Bed Mobility Details for Bed Mobility Assistance: Can get up OOB in any way as long as he focuses on a target first which helps negate the dizziness--needs verbal cues to remember to do this Transfers Transfers: Sit to Stand;Stand to Sit Sit to Stand: 6: Modified independent (Device/Increase time);Without upper extremity assist;From bed Stand to Sit: 6: Modified independent (Device/Increase time);With upper extremity assist;To bed Details for Transfer Assistance: Sit<>stand he can do again as long as he focuses on a target first which helps negate the dizziness--needs VCs to dot this           End of Session OT - End of Session Activity Tolerance: Patient tolerated treatment well Patient left: in bed;with call bell/phone within reach;with nursing/sitter in room;with family/visitor present    Evette Georges 11/03/2012, 4:27 PM

## 2012-11-04 ENCOUNTER — Observation Stay (HOSPITAL_COMMUNITY): Payer: BC Managed Care – PPO

## 2012-11-04 DIAGNOSIS — I951 Orthostatic hypotension: Secondary | ICD-10-CM

## 2012-11-04 LAB — TROPONIN I
Troponin I: <0.3 ng/mL (ref <0.30)
Troponin I: <0.3 ng/mL (ref <0.30)

## 2012-11-04 MED ORDER — SODIUM CHLORIDE 0.9 % IV SOLN
INTRAVENOUS | Status: DC
  Administered 2012-11-04: 17:00:00 via INTRAVENOUS

## 2012-11-04 NOTE — Evaluation (Addendum)
Physical Therapy Evaluation Patient Details Name: [name] [name] MRN: [id number] DOB: 1965-09-27 Today's Date: 11/04/2012 Time: 4098-1191 PT Time Calculation (min): 38 min  PT Assessment / Plan / Recommendation History of Present Illness  [name] [name] is a 47 y.o. male with a history of low testosterone and a questionable history of hyperglycemia.  He gives a history of intermittent weakness over the past 12 years.  This weakness would resolve with a brief 5 - 10 min rest.  Approximately 1 year ago he was helping to unload a moving truck and noticed that his extremities would collapse but would return to normal with a brief rest.  Over the past two weeks he has felt abnormal.  He has developed right leg intermittent twitching that is worse after he eats, he felt very weak, and his vision became blurry.  He saw his PCP who started him on meclizine.  On Friday afternoon 7/18 he began to have double vision and worsening blurry vision.  He became too nauseated to walk.  He was seen again in his PCP's office on 7/22 and MRI was ordered.  The MRI shows chonic multiple sclerosis.  His PCP called for direct admission.     Clinical Impression  Pt admitted with new diagnosis of MS.  Pt c/o spinning, dizziness, and double vision.  Pt reports dizziness and feeling overall lousy began 2 weeks ago after beach vacation.  His PCP prescribed meclizine however pt states this did not help, and he began to feel worse.   Pt describes dizziness as nausea, spinning, and double vision.  Pt reports spinning induced by position change especially rolling to R side in bed and also occurs with ambulation.  Pt states dizziness usually lasts until he changes position however unable to state how long it occurs when present. Vestibular evaluation performed with the following findings: eye alignment impaired with L eye resting inward gaze, gaze holding nystagmus present to left, smooth pursuits normal, saccades, VOR slow, head thrust,  head shaking and VOR cancellation negative however pt reports dizziness with all.  Due to above findings performed R Gilberto Better which was positive for nystagmus so performed canalith repositioning maneuver.  Pt reports spinning sensation and dizziness resolved and able to ambulate without symptoms except for continued diplopia.  Pt left with OT to for further exam and tx of diplopia as both PT and OT believe diplopia likely related to MS.  Will f/u with pt tomorrow if remains in hospital.    PT Assessment  Patient needs continued PT services    Follow Up Recommendations  Outpatient PT (vestibular)    Does the patient have the potential to tolerate intense rehabilitation      Barriers to Discharge        Equipment Recommendations  None recommended by PT    Recommendations for Other Services     Frequency Min 3X/week    Precautions / Restrictions Precautions Precautions: Fall Precaution Comments: dizziness gone after PT vestibular session Restrictions Weight Bearing Restrictions: No   Pertinent Vitals/Pain No pain      Mobility Bed Mobility Bed Mobility: Supine to Sit Supine to Sit: 6: Modified independent (Device/Increase time) Details for Bed Mobility Assistance: slow due to dizziness with mobility Transfers Transfers: Sit to Stand;Stand to Sit Sit to Stand: 6: Modified independent (Device/Increase time);Without upper extremity assist;From bed Stand to Sit: 6: Modified independent (Device/Increase time);With upper extremity assist;To bed Ambulation/Gait Ambulation/Gait Assistance: 5: Supervision;4: Min guard Ambulation Distance (Feet): 200 Feet Assistive device: None Ambulation/Gait  Assistance Details: initially wide BOS due to uncertainty per pt and then able to achieve his regular gait pattern, ambulated with and without glasses with tape for diplopia (applied per OT) pt denies dizziness and spinning Gait Pattern: Step-through pattern;Narrow base of support;Wide base of  support        PT Diagnosis:  (BPPV)  PT Problem List: Decreased mobility;Other (comment) (diplopia, BPPV) PT Treatment Interventions: DME instruction;Gait training;Functional mobility training;Therapeutic exercise;Therapeutic activities;Patient/family education;Other (comment) (CRT, vestibular exercises, compensation techniques)     PT Goals(Current goals can be found in the care plan section) Acute Rehab PT Goals PT Goal Formulation: With patient Time For Goal Achievement: 11/11/12 Potential to Achieve Goals: Good Additional Goals Additional Goal #1: Pt will demonstrate compensation strategies without cues if dizziness occurs with mobility. Additional Goal #2: Pt will perform Semont maneuver without assist or cues.  Visit Information  Last PT Received On: 11/04/12 Assistance Needed: +1 History of Present Illness: [name] [name] is a 47 y.o. male with a history of low testosterone and a questionable history of hyperglycemia.  He gives a history of intermittent weakness over the past 12 years.  This weakness would resolve with a brief 5 - 10 min rest.  Approximately 1 year ago he was helping to unload a moving truck and noticed that his extremities would collapse but would return to normal with a brief rest.  Over the past two weeks he has felt abnormal.  He has developed right leg intermittent twitching that is worse after he eats, he felt very weak, and his vision became blurry.  He saw his PCP who started him on meclizine.  On Friday afternoon 7/18 he began to have double vision and worsening blurry vision.  He became too nauseated to walk.  He was seen again in his PCP's office on 7/22 and MRI was ordered.  The MRI shows chonic multiple sclerosis.  His PCP called for direct admission.         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    Cognition  Cognition Arousal/Alertness: Awake/alert Behavior During Therapy: WFL for tasks assessed/performed Overall Cognitive Status: Within Functional Limits for tasks assessed    Extremity/Trunk Assessment Lower Extremity Assessment Lower Extremity Assessment: Overall WFL for tasks assessed   Balance    End of Session PT - End of Session Activity Tolerance: Patient tolerated treatment well Patient left: in bed;with call bell/phone within reach;Other (comment);with family/visitor present (with OT)  GP Functional Assessment Tool Used: clinical judgement, Positive R Dix hallpike, improved symptoms with canalith repositioning maneuver Functional Limitation: Mobility: Walking and moving around Mobility: Walking and Moving Around Current Status [phone]): At least 1 percent but less than 20 percent impaired, limited or restricted Mobility: Walking and Moving Around Goal Status [phone]): At least 1 percent but less than 20 percent impaired, limited or restricted   Anaaya Fuster,KATHrine E 11/04/2012, 1:46 PM Zenovia Jarred, PT, DPT 11/04/2012 Pager: [phone]

## 2012-11-04 NOTE — Discharge Summary (Signed)
Physician Discharge Summary  [name] [name] JXB:[id number] DOB: [birth year] DOA: 11/03/2012  PCP: Carollee Herter, MD  Admit date: 11/03/2012 Discharge date: 11/04/2012  Recommendations for Outpatient Follow-up:  1. Pt will need to follow up with PCP in 2 weeks post discharge 2. Please obtain BMP to evaluate electrolytes and kidney function 3. Please also check CBC to evaluate Hg and Hct levels 4. Follow up with Neurology, Dr. Marjory Lies on 11/09/12 @1030AM   Discharge Diagnoses:  Active Problems:   Multiple sclerosis   Benign paroxysmal positional vertigo   Dizziness and giddiness  vertigo -Dix-Hallpike maneuver was positive; therefore, his symptoms can be partly attributable to benign paroxysmal positional vertigo -Physical therapy/occupational therapy saw the patient for vestibular treatment -Outpatient physical therapy was set up prior to discharge -Continue when necessary and meclizine -TSH 1.488 -Hemoglobin A1c 5.3 Abnormal MRI brain -Advanced white matter disease in the cerebellum and gray matter nuclei suggestive of multiple sclerosis--no acute demyelination was noted--no contrast enhancement -Neurology was consulted--they did not recommend any IV steroids at this time as the patient did not have any contrast enhancement suggestive of acute demyelinating process -They recommended vestibular therapy and outpatient followup -Outpatient neurology department was set up for the patient with Dr. Marjory Lies at 1030AM on 11/09/12 Testosterone deficiency -Continue home dose of testosterone replacement- Discharge Condition: stable Disposition: home Follow-up Information   Follow up with Joycelyn Schmid, MD On 11/09/2012. (Arrive at 10:30 for an 11:00 APPT.  Bring insurance card.)    Contact information:   [address] [place] [address] [phone]       Diet:regular Wt Readings from Last 3 Encounters:  11/03/12 79.516 kg (175 lb 4.8 oz)  08/30/12 78.926 kg  (174 lb)  10/06/11 75.751 kg (167 lb)    History of present illness:   47 y.o. male with a history of low testosterone and a questionable history of hyperglycemia. He gives a history of intermittent weakness over the past 12 years. This weakness would resolve with a brief 5 - 10 min rest. Approximately 1 year ago he was helping to unload a moving truck and noticed that his extremities would collapse but would return to normal with a brief rest. Over the past two weeks he has felt abnormal. He has developed right leg intermittent twitching that is worse after he eats, he felt very weak, and his vision became blurry. He saw his PCP who started him on meclizine. On Friday afternoon 7/18 he began to have double vision and worsening blurry vision. He became too nauseated to walk. He was seen again in his PCP's office on 7/22 and MRI was ordered. The MRI shows chonic multiple sclerosis. His PCP called for direct admission.     Consultants: neurology  Discharge Exam: Filed Vitals:   11/04/12 1000  BP: 108/69  Pulse: 69  Temp: 98.5 F (36.9 C)  Resp: 20   Filed Vitals:   11/03/12 1300 11/03/12 2115 11/04/12 0438 11/04/12 1000  BP: 112/67 107/71 112/68 108/69  Pulse: 78 72 75 69  Temp: 98 F (36.7 C) 98.4 F (36.9 C) 97.3 F (36.3 C) 98.5 F (36.9 C)  TempSrc: Oral Oral Oral Oral  Resp: 20 20 20 20   Weight:  79.516 kg (175 lb 4.8 oz)    SpO2: 98% 96% 99% 95%   General: A&O x 3, NAD, pleasant, cooperative Cardiovascular: RRR, no rub, no gallop, no S3 Respiratory: CTAB, no wheeze, no rhonchi Abdomen:soft, nontender, nondistended, positive bowel sounds Extremities: No edema, No lymphangitis, no petechiae  Discharge Instructions  Discharge Orders   Future Appointments Provider Department Dept Phone   11/09/2012 11:00 AM Suanne Marker, MD [facility] [phone]   Future Orders Complete By Expires     Diet - low sodium heart healthy  As directed     Increase  activity slowly  As directed         Medication List         Testosterone 20.25 MG/ACT (1.62%) Gel  Commonly known as:  ANDROGEL PUMP  Place 3 Squirts onto the skin daily.         The results of significant diagnostics from this hospitalization (including imaging, microbiology, ancillary and laboratory) are listed below for reference.    Significant Diagnostic Studies: Mr Lodema Pilot Contrast  2012-11-28   *RADIOLOGY REPORT*  Clinical Data: 47 year old male with diplopia, nausea, headache, dizziness.  MRI HEAD WITHOUT AND WITH CONTRAST  Technique:  Multiplanar, multiecho pulse sequences of the brain and surrounding structures were obtained according to standard protocol without and with intravenous contrast  Contrast: 16mL MULTIHANCE GADOBENATE DIMEGLUMINE 529 MG/ML IV SOLN  Comparison: None.  Findings: Extensive cerebral white matter signal abnormality, with widespread areas of small cystic encephalomalacia.  Sagittal FLAIR images demonstrate lesions generally arranged in a perpendicular pattern to the ventricles.  Thinning of the corpus callosum. Bilateral temporal lobe involvement.  Scattered involvement of the deep gray matter nuclei, more so the thalami.  Left middle cerebellar peduncle involvement.  Subtle right cerebellar hemisphere involvement.  Dorsal medulla oblongata involvement. Grossly negative visualized cervical spine.  Diffusion weighted images and postcontrast imaging do not demonstrate any enhancing lesions, or lesions with definite restricted diffusion (although a small left lateral thalamic lesion could be mildly restricted height and series 4 image 17 and series 400 image 17).  No areas of cortical encephalomalacia are identified. No restricted diffusion to suggest acute infarction.  No acute or chronic intracranial hemorrhage identified.  Negative pituitary. Major intracranial vascular flow voids are preserved.  Normal bone marrow signal. Visualized orbit soft tissues are  within normal limits.  Visualized paranasal sinuses and mastoids are clear.  Grossly normal visualized internal auditory structures. Negative scalp soft tissues.  IMPRESSION: Advanced white matter disease with superimposed changes in the cerebellum and deep gray matter nuclei.  The pattern of disease is most consistent with chronic multiple sclerosis.  No definite acute demyelination.  No other intracranial abnormality identified.   Original Report Authenticated By: Erskine Speed, M.D.     Microbiology: No results found for this or any previous visit (from the past 240 hour(s)).   Labs: Basic Metabolic Panel:  Recent Labs Lab 2012-11-28 1545  NA 138  K 4.3  CL 101  CO2 29  GLUCOSE 88  BUN 22  CREATININE 0.76  CALCIUM 9.5   Liver Function Tests:  Recent Labs Lab 11-28-12 1545  AST 16  ALT 14  ALKPHOS 48  BILITOT 0.4  PROT 7.6  ALBUMIN 3.9   No results found for this basename: LIPASE, AMYLASE,  in the last 168 hours No results found for this basename: AMMONIA,  in the last 168 hours CBC:  Recent Labs Lab 11/28/12 1545  WBC 8.6  HGB 14.0  HCT 41.0  MCV 87.0  PLT 230   Cardiac Enzymes: No results found for this basename: CKTOTAL, CKMB, CKMBINDEX, TROPONINI,  in the last 168 hours BNP: No components found with this basename: POCBNP,  CBG: No results found for this basename: GLUCAP,  in the  last 168 hours  Time coordinating discharge:  Greater than 30 minutes  Signed:  Alamin Mccuiston, DO [facility] Pager: [phone] 11/04/2012, 1:33 PM

## 2012-11-04 NOTE — Progress Notes (Signed)
Utilization review completed. Chrysa Rampy, RN, BSN. 

## 2012-11-04 NOTE — Progress Notes (Signed)
NEURO HOSPITALIST PROGRESS NOTE   SUBJECTIVE:                                                                                                                        Patient has had Eply maneuver this AM and is significantly better. He has not needed to use the valium.   OBJECTIVE:                                                                                                                           Vital signs in last 24 hours: Temp:  [97.3 F (36.3 C)-98.5 F (36.9 C)] 98.5 F (36.9 C) (07/24 1000) Pulse Rate:  [69-78] 69 (07/24 1000) Resp:  [20] 20 (07/24 1000) BP: (107-112)/(67-71) 108/69 mmHg (07/24 1000) SpO2:  [95 %-99 %] 95 % (07/24 1000) Weight:  [175 lb 4.8 oz (79.516 kg)] 175 lb 4.8 oz (79.516 kg) (07/23 2115)  Intake/Output from previous day: 07/23 0701 - 07/24 0700 In: 240 [P.O.:240] Out: -  Intake/Output this shift: Total I/O In: 120 [P.O.:120] Out: -  Nutritional status: General  Past Medical History  Diagnosis Date  . Testosterone deficiency 11/04/11  . 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 Other postprocedural status(V45.89)     inguinal herniorrhaphies bilateral   . Neuromuscular disorder 10/2012    "high probability of MS"      Neurologic Exam:  Mental Status: Alert, oriented, thought content appropriate.  Speech fluent without evidence of aphasia.  Able to follow 3 step commands without difficulty. Cranial Nerves: II: Discs flat bilaterally; Visual fields grossly normal, pupils equal, round, reactive to light and accommodation III,IV, VI: ptosis not present, extra-ocular motions intact bilaterally V,VII: smile symmetric, facial light touch sensation normal bilaterally VIII: hearing normal bilaterally IX,X: gag reflex present XI: bilateral shoulder shrug XII: midline tongue extension Motor: Right : Upper extremity   5/5    Left:     Upper extremity   5/5  Lower  extremity   5/5     Lower extremity   5/5 Tone and bulk:normal tone throughout; no atrophy noted Sensory: Pinprick and light touch intact throughout, bilaterally Deep Tendon Reflexes:  Right: Upper Extremity  Left: Upper extremity   biceps (C-5 to C-6) 2/4   biceps (C-5 to C-6) 2/4 tricep (C7) 2/4    triceps (C7) 2/4 Brachioradialis (C6) 2/4  Brachioradialis (C6) 2/4  Lower Extremity Lower Extremity  quadriceps (L-2 to L-4) 2/4   quadriceps (L-2 to L-4) 2/4 Achilles (S1) 1/4   Achilles (S1) 1/4  Plantars: Right: downgoing   Left: downgoing Cerebellar: normal finger-to-nose,  normal heel-to-shin test CV: pulses palpable throughout    Lab Results: Lab Results  Component Value Date/Time   CHOL 207* 08/30/2012 11:17 AM   Lipid Panel No results found for this basename: CHOL, TRIG, HDL, CHOLHDL, VLDL, LDLCALC,  in the last 72 hours  Studies/Results: Mr [name] Contrast  11/03/2012   *RADIOLOGY REPORT*  Clinical Data: 47 year old male with diplopia, nausea, headache, dizziness.  MRI HEAD WITHOUT AND WITH CONTRAST  Technique:  Multiplanar, multiecho pulse sequences of the brain and surrounding structures were obtained according to standard protocol without and with intravenous contrast  Contrast: 16mL MULTIHANCE GADOBENATE DIMEGLUMINE 529 MG/ML IV SOLN  Comparison: None.  Findings: Extensive cerebral white matter signal abnormality, with widespread areas of small cystic encephalomalacia.  Sagittal FLAIR images demonstrate lesions generally arranged in a perpendicular pattern to the ventricles.  Thinning of the corpus callosum. Bilateral temporal lobe involvement.  Scattered involvement of the deep gray matter nuclei, more so the thalami.  Left middle cerebellar peduncle involvement.  Subtle right cerebellar hemisphere involvement.  Dorsal medulla oblongata involvement. Grossly negative visualized cervical spine.  Diffusion weighted images and postcontrast imaging do not demonstrate any  enhancing lesions, or lesions with definite restricted diffusion (although a small left lateral thalamic lesion could be mildly restricted height and series 4 image 17 and series 400 image 17).  No areas of cortical encephalomalacia are identified. No restricted diffusion to suggest acute infarction.  No acute or chronic intracranial hemorrhage identified.  Negative pituitary. Major intracranial vascular flow voids are preserved.  Normal bone marrow signal. Visualized orbit soft tissues are within normal limits.  Visualized paranasal sinuses and mastoids are clear.  Grossly normal visualized internal auditory structures. Negative scalp soft tissues.  IMPRESSION: Advanced white matter disease with superimposed changes in the cerebellum and deep gray matter nuclei.  The pattern of disease is most consistent with chronic multiple sclerosis.  No definite acute demyelination.  No other intracranial abnormality identified.   Original Report Authenticated By: Erskine Speed, M.D.    MEDICATIONS                                                                                                                        Scheduled: . sodium chloride  3 mL Intravenous Q12H  . Testosterone  3 Squirt Transdermal Daily    ASSESSMENT/PLAN:  47 yo male with 12 year history of waxing and waning symptoms of fatigue and right leg weakness. Patient presented to PCP due to vertigo. MRI with contrast of brain was obtained and showed advanced white matter disease with superimposed changes in the cerebellum and deep gray matter nuclei, likely manifestations of multiple sclerosis.  Patient did benefit from Eply maneuver.  He has a out patient appointment with neurology on July 29th.    Neurology will S/O.  Assessment and plan discussed with with attending physician and they are in agreement.    Felicie Morn PA-C [facility] [phone]  11/04/2012, 12:23 PM

## 2012-11-04 NOTE — Progress Notes (Signed)
Pt got up to go to the restroom. Urinated and felt "funny"in his epigastric area and also felt flushed. Sat down BP 117/72 HR 75 O2 sat 98% on ra. Pt states the center of the back of his head and neck fills "thick". Tracheal area tender to touch. Chest also feels a little heavy and also feeling slightly nausous

## 2012-11-04 NOTE — Progress Notes (Signed)
Occupational Therapy Treatment Patient Details Name: [name] [name] MRN: [id number] DOB: 05-28-1965 Today's Date: 11/04/2012 Time: 0915-0950 OT Time Calculation (min): 35 min  OT Assessment / Plan / Recommendation  History of present illness [name] [name] is a 47 y.o. male with a history of low testosterone and a questionable history of hyperglycemia.  He gives a history of intermittent weakness over the past 12 years.  This weakness would resolve with a brief 5 - 10 min rest.  Approximately 1 year ago he was helping to unload a moving truck and noticed that his extremities would collapse but would return to normal with a brief rest.  Over the past two weeks he has felt abnormal.  He has developed right leg intermittent twitching that is worse after he eats, he felt very weak, and his vision became blurry.  He saw his PCP who started him on meclizine.  On Friday afternoon 7/18 he began to have double vision and worsening blurry vision.  He became too nauseated to walk.  He was seen again in his PCP's office on 7/22 and MRI was ordered.  The MRI shows chonic multiple sclerosis.  His PCP called for direct admission.        OT comments  Goal med, pt will benefit from continued OT at OP (prefers [facility] [facility]) to work on eye muscle strengthening to try and remediate diploplia. Acute OT will sign off.  Follow Up Recommendations  Outpatient OT (for diploplia)       Equipment Recommendations  None recommended by OT       Frequency Min 2X/week   Progress towards OT Goals Progress towards OT goals: Goals met/education completed, patient discharged from OT  Plan Discharge plan remains appropriate    Precautions / Restrictions Precautions Precautions: Fall Precaution Comments: dizziness gone after PT vestibular session Restrictions Weight Bearing Restrictions: No       ADL  ADL Comments: Main focus today was to work on compensating for diplopia for pt to be functional until he can follow up with  [facility].      OT Goals(current goals can now be found in the care plan section)    Visit Information  Last OT Received On: 11/04/12 Assistance Needed: +1 PT/OT Co-Evaluation/Treatment: Yes History of Present Illness: [name] [name] is a 47 y.o. male with a history of low testosterone and a questionable history of hyperglycemia.  He gives a history of intermittent weakness over the past 12 years.  This weakness would resolve with a brief 5 - 10 min rest.  Approximately 1 year ago he was helping to unload a moving truck and noticed that his extremities would collapse but would return to normal with a brief rest.  Over the past two weeks he has felt abnormal.  He has developed right leg intermittent twitching that is worse after he eats, he felt very weak, and his vision became blurry.  He saw his PCP who started him on meclizine.  On Friday afternoon 7/18 he began to have double vision and worsening blurry vision.  He became too nauseated to walk.  He was seen again in his PCP's office on 7/22 and MRI was ordered.  The MRI shows chonic multiple sclerosis.  His PCP called for direct admission.      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  Communication: No difficulties Dominant Hand: Right    Cognition  Cognition Arousal/Alertness: Awake/alert Behavior During Therapy: WFL for tasks assessed/performed Overall Cognitive Status: Within Functional Limits for tasks assessed    Mobility  Bed Mobility Bed Mobility: Supine to Sit Supine to Sit: 6: Modified independent (Device/Increase time) Details for Bed Mobility Assistance: slow due to dizziness with mobility Transfers Sit to Stand: 6: Modified independent (Device/Increase time);Without upper extremity  assist;From bed Stand to Sit: 6: Modified independent (Device/Increase time);With upper extremity assist;To bed    Exercises  Other Exercises Other Exercises: pt sees double when he looks to left (about 2/3 to full far left, with starting out as blurry and then going to double vision). Used transpore tape to tape pt's glasses right nasal side and left lateral side to help compensate for his blurry into double vision.  Pt reports this did get rid of his double vision and at the same time it allows him to see light and see with his eyes where as patching would totally occlude vision in one eye at a time.      End of Session OT - End of Session Activity Tolerance: Patient tolerated treatment well Patient left: in bed;with call bell/phone within reach;with family/visitor present       Evette Georges [phone] 11/04/2012, 10:35 AM

## 2012-11-04 NOTE — Progress Notes (Signed)
Called by RN to see patient. Patient felt some dizziness as he got up to go to the bathroom to urinate. After urinating, the patient continued to have some dizziness. He had a flushed feeling with some chest discomfort. He has some shortness of breath. Denies any vomiting but complains of some nausea. Denies any dizziness, visual loss, vomiting, abdominal pain, dysuria. He continues to feel generalized weakness which is essentially unchanged. His vision is unchanged. He denies any focal arm or leg weakness.  I performed orthostatic vitals: Lying  121/69  HR 70 Sitting  108/69  HR 79 Standing  102/65  HR 90  I will start IV NS.  EKG. Cycle troponins. Chest x-ray. Check d-dimer. Cancel discharge. Wife at the bedside updated.  DTat

## 2012-11-04 NOTE — Progress Notes (Signed)
Pt states after eating dinner his vision got very blurry. No darkness noted. Able to identify number of fingers held up from approximately  5 feet away. States head feels thick after eating as well.

## 2012-11-05 LAB — RAPID URINE DRUG SCREEN, HOSP PERFORMED
Amphetamines: NOT DETECTED
Barbiturates: NOT DETECTED
Opiates: NOT DETECTED
Tetrahydrocannabinol: NOT DETECTED

## 2012-11-05 LAB — URINALYSIS W MICROSCOPIC + REFLEX CULTURE
Hgb urine dipstick: NEGATIVE
Nitrite: NEGATIVE
Protein, ur: NEGATIVE mg/dL
Specific Gravity, Urine: 1.023 (ref 1.005–1.030)
Urobilinogen, UA: 0.2 mg/dL (ref 0.0–1.0)

## 2012-11-05 LAB — BASIC METABOLIC PANEL
CO2: 28 mEq/L (ref 19–32)
Calcium: 8.9 mg/dL (ref 8.4–10.5)
Chloride: 108 mEq/L (ref 96–112)
Creatinine, Ser: 0.79 mg/dL (ref 0.50–1.35)
Glucose, Bld: 95 mg/dL (ref 70–99)
Sodium: 140 mEq/L (ref 135–145)

## 2012-11-05 LAB — CBC
HCT: 39.8 % (ref 39.0–52.0)
MCH: 30 pg (ref 26.0–34.0)
MCV: 87.3 fL (ref 78.0–100.0)
RBC: 4.56 MIL/uL (ref 4.22–5.81)
WBC: 8.8 10*3/uL (ref 4.0–10.5)

## 2012-11-05 NOTE — Discharge Summary (Signed)
Physician Discharge Summary  [name] [name] JYN:[id number] DOB: Aug 28, 1965 DOA: 11/03/2012  PCP: Carollee Herter, MD  Admit date: 11/03/2012 Discharge date: 11/05/2012  Recommendations for Outpatient Follow-up:  1. Pt will need to follow up with PCP in 2 weeks post discharge 2. Please obtain BMP to evaluate electrolytes and kidney function 3. Please also check CBC to evaluate Hg and Hct levels 4. Follow up with Neurology, Dr. Marjory Lies on 11/09/12 @1030AM   Discharge Diagnoses:  Active Problems:   Multiple sclerosis   Benign paroxysmal positional vertigo   Dizziness and giddiness   Orthostatic hypotension  vertigo -Dix-Hallpike maneuver was positive; therefore, his symptoms can be partly attributable to benign paroxysmal positional vertigo -Physical therapy/occupational therapy saw the patient for vestibular treatment -Outpatient physical therapy was set up prior to discharge -Continue when necessary and meclizine -TSH 1.488 -Hemoglobin A1c 5.3 Orthostatic hypotension -Prior to discharge on 11/04/2012, the patient developed some dizziness and chest discomfort -Troponins are negative x3 -EKG without any ST -T wave change, NSR -Chest x-ray without infiltrates, mild bronchitic changes -Patient remained afebrile, hemodynamically stable -Intravenous fluids were infused--> patient symptoms improved -On the day of discharge, no chest discomfort, dizziness, shortness of breath Abnormal MRI brain -Advanced white matter disease in the cerebellum and gray matter nuclei suggestive of multiple sclerosis--no acute demyelination was noted--no contrast enhancement -Neurology was consulted--they did not recommend any IV steroids at this time as the patient did not have any contrast enhancement suggestive of acute demyelinating process -They recommended vestibular therapy and outpatient followup -Outpatient neurology department was set up for the patient with Dr. Marjory Lies at 1030AM on  11/09/12 Testosterone deficiency -Continue home dose of testosterone replacement- Discharge Condition: stable Disposition: home Follow-up Information   Follow up with Joycelyn Schmid, MD On 11/09/2012. (Arrive at 10:30 for an 11:00 APPT.  Bring insurance card.)    Contact information:   [address] [place] [address] [phone]       Diet:regular Wt Readings from Last 3 Encounters:  11/04/12 78.654 kg (173 lb 6.4 oz)  08/30/12 78.926 kg (174 lb)  10/06/11 75.751 kg (167 lb)    History of present illness:   47 y.o. male with a history of low testosterone and a questionable history of hyperglycemia. He gives a history of intermittent weakness over the past 12 years. This weakness would resolve with a brief 5 - 10 min rest. Approximately 1 year ago he was helping to unload a moving truck and noticed that his extremities would collapse but would return to normal with a brief rest. Over the past two weeks he has felt abnormal. He has developed right leg intermittent twitching that is worse after he eats, he felt very weak, and his vision became blurry. He saw his PCP who started him on meclizine. On Friday afternoon 7/18 he began to have double vision and worsening blurry vision. He became too nauseated to walk. He was seen again in his PCP's office on 7/22 and MRI was ordered. The MRI shows chonic multiple sclerosis. His PCP called for direct admission.     Consultants: neurology  Discharge Exam: Filed Vitals:   11/05/12 0609  BP: 108/59  Pulse: 64  Temp: 98.1 F (36.7 C)  Resp: 18   Filed Vitals:   11/04/12 1552 11/04/12 1845 11/04/12 2117 11/05/12 0609  BP: 107/70 118/60 95/58 108/59  Pulse: 79 73 71 64  Temp:  98.7 F (37.1 C) 98.5 F (36.9 C) 98.1 F (36.7 C)  TempSrc:  Oral Oral Oral  Resp:  18 18 18   Weight:   78.654 kg (173 lb 6.4 oz)   SpO2:  99% 98% 100%   General: A&O x 3, NAD, pleasant, cooperative Cardiovascular: RRR, no rub, no gallop,  no S3 Respiratory: CTAB, no wheeze, no rhonchi Abdomen:soft, nontender, nondistended, positive bowel sounds Extremities: No edema, No lymphangitis, no petechiae  Discharge Instructions      Discharge Orders   Future Appointments Provider Department Dept Phone   11/09/2012 11:00 AM Suanne Marker, MD [facility] [phone]   Future Orders Complete By Expires     Diet - low sodium heart healthy  As directed     Increase activity slowly  As directed         Medication List         Testosterone 20.25 MG/ACT (1.62%) Gel  Commonly known as:  ANDROGEL PUMP  Place 3 Squirts onto the skin daily.         The results of significant diagnostics from this hospitalization (including imaging, microbiology, ancillary and laboratory) are listed below for reference.    Significant Diagnostic Studies: Mr Lodema Pilot Contrast  2012/11/20   *RADIOLOGY REPORT*  Clinical Data: 47 year old male with diplopia, nausea, headache, dizziness.  MRI HEAD WITHOUT AND WITH CONTRAST  Technique:  Multiplanar, multiecho pulse sequences of the brain and surrounding structures were obtained according to standard protocol without and with intravenous contrast  Contrast: 16mL MULTIHANCE GADOBENATE DIMEGLUMINE 529 MG/ML IV SOLN  Comparison: None.  Findings: Extensive cerebral white matter signal abnormality, with widespread areas of small cystic encephalomalacia.  Sagittal FLAIR images demonstrate lesions generally arranged in a perpendicular pattern to the ventricles.  Thinning of the corpus callosum. Bilateral temporal lobe involvement.  Scattered involvement of the deep gray matter nuclei, more so the thalami.  Left middle cerebellar peduncle involvement.  Subtle right cerebellar hemisphere involvement.  Dorsal medulla oblongata involvement. Grossly negative visualized cervical spine.  Diffusion weighted images and postcontrast imaging do not demonstrate any enhancing lesions, or lesions with  definite restricted diffusion (although a small left lateral thalamic lesion could be mildly restricted height and series 4 image 17 and series 400 image 17).  No areas of cortical encephalomalacia are identified. No restricted diffusion to suggest acute infarction.  No acute or chronic intracranial hemorrhage identified.  Negative pituitary. Major intracranial vascular flow voids are preserved.  Normal bone marrow signal. Visualized orbit soft tissues are within normal limits.  Visualized paranasal sinuses and mastoids are clear.  Grossly normal visualized internal auditory structures. Negative scalp soft tissues.  IMPRESSION: Advanced white matter disease with superimposed changes in the cerebellum and deep gray matter nuclei.  The pattern of disease is most consistent with chronic multiple sclerosis.  No definite acute demyelination.  No other intracranial abnormality identified.   Original Report Authenticated By: Erskine Speed, M.D.     Microbiology: No results found for this or any previous visit (from the past 240 hour(s)).   Labs: Basic Metabolic Panel:  Recent Labs Lab 2012/11/20 1545 11/05/12 0435  NA 138 140  K 4.3 4.5  CL 101 108  CO2 29 28  GLUCOSE 88 95  BUN 22 15  CREATININE 0.76 0.79  CALCIUM 9.5 8.9   Liver Function Tests:  Recent Labs Lab Nov 20, 2012 1545  AST 16  ALT 14  ALKPHOS 48  BILITOT 0.4  PROT 7.6  ALBUMIN 3.9   No results found for this basename: LIPASE, AMYLASE,  in the last 168 hours No results  found for this basename: AMMONIA,  in the last 168 hours CBC:  Recent Labs Lab 11/03/12 1545 11/05/12 0435  WBC 8.6 8.8  HGB 14.0 13.7  HCT 41.0 39.8  MCV 87.0 87.3  PLT 230 223   Cardiac Enzymes:  Recent Labs Lab 11/04/12 1706 11/04/12 2233 11/05/12 0435  TROPONINI <0.30 <0.30 <0.30   BNP: No components found with this basename: POCBNP,  CBG: No results found for this basename: GLUCAP,  in the last 168 hours  Time coordinating discharge:   Greater than 30 minutes  Signed:  Koehn Salehi, DO [facility] Pager: [phone] 11/05/2012, 9:28 AM

## 2012-11-05 NOTE — Progress Notes (Signed)
Pt. Got d/c orders and instructions.IV was d/c.pt. Ready to go home.

## 2012-11-08 NOTE — Care Management Note (Signed)
Late Entry:  VV   CARE MANAGEMENT NOTE 11/08/2012  Patient:  [name] [name]   Account Number:  [id number]  Date Initiated:  11/04/2012  Documentation initiated by:  Keshawn Fiorito  Subjective/Objective Assessment:   Orders for outptatient PT/OT and Vestibular exercise.     Action/Plan:   Pt requeste outpatient therapies to be arranged at [facility]. This CM spoke with Outpatient Rehab at [facility] and faxed all info to that facility.   Anticipated DC Date:     Anticipated DC Plan:           Choice offered to / List presented to:             Status of service:  Completed, signed off Medicare Important Message given?   (If response is "NO", the following Medicare IM given date fields will be blank) Date Medicare IM given:   Date Additional Medicare IM given:    Discharge Disposition:  HOME/SELF CARE  Per UR Regulation:    If discussed at Long Length of Stay Meetings, dates discussed:    Comments:  11/08/2012 Outpatient PT/OT and Vestibular exercises arranged with [facility] per pt request. Johny Shock RN MPH Case Manager [phone]

## 2012-11-09 ENCOUNTER — Encounter: Payer: Self-pay | Admitting: Diagnostic Neuroimaging

## 2012-11-09 ENCOUNTER — Ambulatory Visit (INDEPENDENT_AMBULATORY_CARE_PROVIDER_SITE_OTHER): Payer: BC Managed Care – PPO | Admitting: Diagnostic Neuroimaging

## 2012-11-09 VITALS — BP 110/70 | HR 75 | Temp 98.0°F | Ht 70.0 in | Wt 172.0 lb

## 2012-11-09 DIAGNOSIS — R269 Unspecified abnormalities of gait and mobility: Secondary | ICD-10-CM

## 2012-11-09 DIAGNOSIS — R42 Dizziness and giddiness: Secondary | ICD-10-CM

## 2012-11-09 DIAGNOSIS — R5383 Other fatigue: Secondary | ICD-10-CM

## 2012-11-09 DIAGNOSIS — R531 Weakness: Secondary | ICD-10-CM | POA: Insufficient documentation

## 2012-11-09 DIAGNOSIS — R930 Abnormal findings on diagnostic imaging of skull and head, not elsewhere classified: Secondary | ICD-10-CM

## 2012-11-09 DIAGNOSIS — R9089 Other abnormal findings on diagnostic imaging of central nervous system: Secondary | ICD-10-CM | POA: Insufficient documentation

## 2012-11-09 MED ORDER — PREDNISONE 10 MG PO TABS: ORAL_TABLET | ORAL | Status: DC

## 2012-11-09 NOTE — Patient Instructions (Signed)
Start prednisone.  I will check MRI and labs.

## 2012-11-09 NOTE — Progress Notes (Addendum)
[facility]  PATIENT: [name] [name] DOB: November 12, 1965  REFERRING CLINICIAN: ER HISTORY FROM: patient and wife REASON FOR VISIT: new consult   HISTORICAL  CHIEF COMPLAINT:  Chief Complaint  Patient presents with  . Dizziness    dizzy since leaving beach on 07/14    HISTORY OF PRESENT ILLNESS:   47 year old right-handed male, with low testosterone, chromosomal abnormality, here for evaluation of possible multiple sclerosis.  For past 10-15 years patient has had intermittent episodes of exertional weakness in his legs. Seems to be worse in the hot weather. Typically he would feel weakness, right leg greater than left, sit down and symptoms would improve. He also noticed over the years back when he would take hot showers his muscles would get very weak. He also recalls episodes of electrical numb sensation in his neck and spine when he would flex his head forward (i.e. Lhermitte sign). He has had some problems with blurred vision in the past. No definite episodes of optic neuritis in the past. No significant diffuse pain. No dry eyes or dry mouth. No significant rash. No family history of MS or other disease.  Recently patient has had increasing episodes of dizziness, nausea, double vision. Patient went to the hospital was admitted (11/03/12-11/05/12), diagnosed with possible multiple sclerosis and benign positional vertigo. He was not treated with IV steroids. He received physical and vestibular therapy, gradually improved and was discharged on. Since going home his dizziness has continued intermittently.  REVIEW OF SYSTEMS: Full 14 system review of systems performed and notable only for fatigue palpitations spinning sensation impotence snoring shortness of breath blurred vision double vision feeling hot flushing anxiety decreased energy change in appetite disinterest activity dizziness weakness or confusion.  ALLERGIES: Allergies  Allergen Reactions  . Penicillins     unknown    HOME MEDICATIONS: Outpatient Prescriptions Prior to Visit  Medication Sig Dispense Refill  . Testosterone (ANDROGEL PUMP) 20.25 MG/ACT (1.62%) GEL Place 3 Squirts onto the skin daily.  450 g  1   No facility-administered medications prior to visit.    PAST MEDICAL HISTORY: Past Medical History  Diagnosis Date  . Testosterone deficiency 11/04/11  . 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 Other postprocedural status(V45.89)     inguinal herniorrhaphies bilateral   . Neuromuscular disorder 10/2012    "high probability of MS"    PAST SURGICAL HISTORY: Past Surgical History  Procedure Laterality Date  . Hernia repair      AT AGE 7    FAMILY HISTORY: Family History  Problem Relation Age of Onset  . Arthritis Mother     SOCIAL HISTORY:  History   Social History  . Marital Status: Married    Spouse Name: N/A    Number of Children: N/A  . Years of Education: N/A   Occupational History  . Not on file.   Social History Main Topics  . Smoking status: Former Smoker    Types: Cigarettes    Quit date: 07/14/2010  . Smokeless tobacco: Never Used  . Alcohol Use: No  . Drug Use: No  . Sexually Active: Yes   Other Topics Concern  . Not on file   Social History Narrative  . No narrative on file     PHYSICAL EXAM  Filed Vitals:   11/09/12 1114 11/09/12 1123 11/09/12 1127  BP:  112/72 110/70  Pulse:  93 75  Temp: 98 F (36.7 C)    TempSrc:  Oral    Height: 5\' 10"  (1.778 m)    Weight: 172 lb (78.019 kg)      Not recorded    Body mass index is 24.68 kg/(m^2).  GENERAL EXAM: Patient is in MILD distress; UNCOMFORTABLE APPEARING. EYES PARTIALLY CLOSED.   CARDIOVASCULAR: Regular rate and rhythm, no murmurs, no carotid bruits  NEUROLOGIC: MENTAL STATUS: awake, alert, language fluent, comprehension intact, naming intact CRANIAL NERVE: no papilledema on fundoscopic exam, RELATIVE RIGHT AFFERENT PUPILLARY DEFECT.  DECR LIGHT SENS IN RIGHT EYE. LEFT PUPIL REACTIVE. SACCADIC BREAKDOWN OF SMOOTH PURSUIT. visual fields full to confrontation, extraocular muscles intact, FEW BEATS END GAZE NYSTAGMUS. facial sensation and strength symmetric, uvula midline, shoulder shrug symmetric, tongue midline. MOTOR: normal bulk and tone, full strength in the BUE, LLE; MILD RIGHT HF WEAKNESS (5-/5).  SENSORY: DECR PP IN RIGHT HAND AND RIGHT ANKLE. DECR VIB IN RIGHT TOES.  COORDINATION: finger-nose-finger, fine finger movements SLOW REFLEXES: BUE (TRICEPS 3, BICEPS AND BRACHIORAD 2; NEG HOFFMANS; NEG PECTORALIS). BLE (RIGHT KNEE 3+, LEFT KNEE 3, ANKLES 2, POS CROSSED ADDUCTORS AND SUPRAPATELLARS); RIGHT TOE UPGOING; LEFT TOE DOWN. GAIT/STATION: SLOW UNSTEADY GAIT. CANNOT WALK ON TOES, HEEL OR TANDEM. Romberg is negative   DIAGNOSTIC DATA (LABS, IMAGING, TESTING) - I reviewed patient records, labs, notes, testing and imaging myself where available.  Lab Results  Component Value Date   WBC 8.8 11/05/2012   HGB 13.7 11/05/2012   HCT 39.8 11/05/2012   MCV 87.3 11/05/2012   PLT 223 11/05/2012      Component Value Date/Time   NA 140 11/05/2012 0435   K 4.5 11/05/2012 0435   CL 108 11/05/2012 0435   CO2 28 11/05/2012 0435   GLUCOSE 95 11/05/2012 0435   BUN 15 11/05/2012 0435   CREATININE 0.79 11/05/2012 0435   CREATININE 0.86 08/30/2012 1117   CALCIUM 8.9 11/05/2012 0435   PROT 7.6 11/03/2012 1545   ALBUMIN 3.9 11/03/2012 1545   AST 16 11/03/2012 1545   ALT 14 11/03/2012 1545   ALKPHOS 48 11/03/2012 1545   BILITOT 0.4 11/03/2012 1545   GFRNONAA >90 11/05/2012 0435   GFRAA >90 11/05/2012 0435   Lab Results  Component Value Date   CHOL 207* 08/30/2012   HDL 48 08/30/2012   LDLCALC 138* 08/30/2012   TRIG 107 08/30/2012   CHOLHDL 4.3 08/30/2012   Lab Results  Component Value Date   HGBA1C 5.3 11/03/2012   No results found for this basename: VITAMINB12   Lab Results  Component Value Date   TSH 1.488 11/03/2012    11/03/12 MRI  brain - Advanced white matter disease with superimposed changes in the  cerebellum and deep gray matter nuclei. The pattern of disease is  most consistent with chronic multiple sclerosis. No definite acute  demyelination. No other intracranial abnormality identified.    ASSESSMENT AND PLAN  47 y.o. 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 here with 10-15 year history of intermittent weakness, fatigue, heat intolerance, permits signed, blurred vision, now with new episode of vertigo, nausea, double vision. MRI and exam findings are highly suspicious for chronic demyelinating disease. I will check further testing to confirm the diagnosis. I will also treat him with short course of oral steroids. After workup, we may consider instituting disease modifying therapy.   Orders Placed This Encounter  Procedures  . MR Cervical Spine W Wo Contrast  .  MR Thoracic Spine W Wo Contrast  . Pan-ANCA  . Angiotensin converting enzyme  . HIV 1/2 confirmation, western blot  . Lyme Ab/Western Blot Reflex  . ANA w/Reflex  . Visual evoked potential test     Meds ordered this encounter  Medications  . predniSONE (DELTASONE) 10 MG tablet    Sig: Take 60mg  on day 1. Reduce by 10mg  each subsequent day. (60, 50, 40, 30, 20, 10, stop)    Dispense:  21 tablet    Refill:  0     Suanne Marker, MD 11/09/2012, 12:34 PM Certified in Neurology, Neurophysiology and Neuroimaging  [facility] [address], [address] [place], [place] [address] [phone]

## 2012-11-11 ENCOUNTER — Ambulatory Visit (INDEPENDENT_AMBULATORY_CARE_PROVIDER_SITE_OTHER): Payer: BC Managed Care – PPO

## 2012-11-11 DIAGNOSIS — R5383 Other fatigue: Secondary | ICD-10-CM

## 2012-11-11 DIAGNOSIS — R930 Abnormal findings on diagnostic imaging of skull and head, not elsewhere classified: Secondary | ICD-10-CM

## 2012-11-11 DIAGNOSIS — R42 Dizziness and giddiness: Secondary | ICD-10-CM

## 2012-11-11 DIAGNOSIS — R269 Unspecified abnormalities of gait and mobility: Secondary | ICD-10-CM

## 2012-11-11 DIAGNOSIS — R9089 Other abnormal findings on diagnostic imaging of central nervous system: Secondary | ICD-10-CM

## 2012-11-11 DIAGNOSIS — R531 Weakness: Secondary | ICD-10-CM

## 2012-11-11 LAB — PAN-ANCA
ANCA Proteinase 3: <3.5 U/mL (ref 0.0–3.5)
Atypical pANCA: <1:20 {titer}
Myeloperoxidase Ab: <9 U/mL (ref 0.0–9.0)
P-ANCA: <1:20 {titer}

## 2012-11-11 LAB — ANGIOTENSIN CONVERTING ENZYME: Angio Convert Enzyme: 21 U/L (ref 14–82)

## 2012-11-12 MED ORDER — GADOPENTETATE DIMEGLUMINE 469.01 MG/ML IV SOLN: 16.0000 mL | Freq: Once | INTRAVENOUS | Status: AC | PRN

## 2012-11-16 ENCOUNTER — Encounter: Payer: Self-pay | Admitting: Family Medicine

## 2012-11-22 ENCOUNTER — Ambulatory Visit (INDEPENDENT_AMBULATORY_CARE_PROVIDER_SITE_OTHER): Payer: BC Managed Care – PPO

## 2012-11-22 DIAGNOSIS — R42 Dizziness and giddiness: Secondary | ICD-10-CM

## 2012-11-22 DIAGNOSIS — R9089 Other abnormal findings on diagnostic imaging of central nervous system: Secondary | ICD-10-CM

## 2012-11-22 DIAGNOSIS — R269 Unspecified abnormalities of gait and mobility: Secondary | ICD-10-CM

## 2012-11-22 DIAGNOSIS — R531 Weakness: Secondary | ICD-10-CM

## 2012-11-22 DIAGNOSIS — R930 Abnormal findings on diagnostic imaging of skull and head, not elsewhere classified: Secondary | ICD-10-CM

## 2012-11-23 ENCOUNTER — Telehealth: Payer: Self-pay | Admitting: Family Medicine

## 2012-11-23 MED ORDER — ONDANSETRON HCL 4 MG PO TABS: 4.0000 mg | ORAL_TABLET | Freq: Three times a day (TID) | ORAL | Status: DC | PRN

## 2012-11-23 NOTE — Telephone Encounter (Signed)
Let him know that I called some medication for him.

## 2012-11-23 NOTE — Telephone Encounter (Signed)
PT WIFE INFORMED

## 2012-11-23 NOTE — Telephone Encounter (Signed)
Ondansetron called in to help with nausea.

## 2012-11-25 NOTE — Procedures (Signed)
   [facility]  VEP (VISUAL EVOKED POTENTIAL) REPORT   STUDY DATE: 11/23/11 PATIENT NAME: [name] [name] DOB: 18-Dec-1965 MRN: [id number]  ORDERING CLINICIAN: Joycelyn Schmid, MD   TECHNOLOGIST: Gearldine Shown TECHNIQUE: The visual evoked potential test was performed using 32 x 32 check sizes with full pattern reversal. CLINICAL INFORMATION: 47 year old male with possible multiple sclerosis.  FINDINGS: There are well formed evoked potential wave forms bilaterally.   P100 latency with right eye stimulation: 152 ms.   P100 latency with left eye stimulation: 151 ms.  The amplitudes for the P100 waveforms were also within normal limits bilaterally.   IMPRESSION: Abnormal visual evoked potential study. Bilateral P100 latencies are significantly prolonged, consistent with dysfunction of the visual pathways that cannot be further localized.   INTERPRETING PHYSICIAN:  Suanne Marker, MD Certified in Neurology, Neurophysiology and Neuroimaging  [facility] [address] [place], [place] [address] [phone]

## 2012-12-01 ENCOUNTER — Telehealth: Payer: Self-pay | Admitting: Diagnostic Neuroimaging

## 2012-12-03 ENCOUNTER — Telehealth: Payer: Self-pay | Admitting: *Deleted

## 2012-12-03 ENCOUNTER — Telehealth: Payer: Self-pay | Admitting: Family Medicine

## 2012-12-03 NOTE — Telephone Encounter (Signed)
I called pt and let him know the results of MRI's, labs, and abnormal visually evoked response.  Heide Guile, NP and Dr. Marjory Lies to go over more specifics when in for f/u appt 12-15-12.  He said he would like to see what the MRI looks like.   Asked what kind of MS (relapse/remitting MS).  I told him to check with insurance re: what there formulary uses for MS.  He verbalized understanding.

## 2012-12-03 NOTE — Telephone Encounter (Signed)
Message copied by Hermenia Fiscal on Fri Dec 03, 2012  5:46 PM ------      Message from: Seth Bake      Created: Fri Dec 03, 2012  2:28 PM       Calling to check the results of his visual test, MRI, and to discuss medications.  Says he has been waiting for days.  Expects a call today.  Very concerned.  ------

## 2012-12-06 NOTE — Telephone Encounter (Signed)
lm

## 2012-12-13 ENCOUNTER — Encounter: Payer: Self-pay | Admitting: Family Medicine

## 2012-12-15 ENCOUNTER — Telehealth: Payer: Self-pay | Admitting: *Deleted

## 2012-12-15 ENCOUNTER — Encounter: Payer: Self-pay | Admitting: Nurse Practitioner

## 2012-12-15 ENCOUNTER — Ambulatory Visit (INDEPENDENT_AMBULATORY_CARE_PROVIDER_SITE_OTHER): Payer: BC Managed Care – PPO | Admitting: Nurse Practitioner

## 2012-12-15 VITALS — BP 112/71 | HR 100 | Temp 98.2°F | Ht 70.0 in | Wt 173.0 lb

## 2012-12-15 DIAGNOSIS — R269 Unspecified abnormalities of gait and mobility: Secondary | ICD-10-CM

## 2012-12-15 DIAGNOSIS — R5381 Other malaise: Secondary | ICD-10-CM

## 2012-12-15 DIAGNOSIS — R531 Weakness: Secondary | ICD-10-CM

## 2012-12-15 DIAGNOSIS — G35 Multiple sclerosis: Secondary | ICD-10-CM

## 2012-12-15 DIAGNOSIS — R42 Dizziness and giddiness: Secondary | ICD-10-CM

## 2012-12-15 NOTE — Patient Instructions (Addendum)
Review information about medications, Tecfidera and Tysabri.  We are checking lab for JC virus antibody today.  Return in 2-4 weeks to start treatment.

## 2012-12-15 NOTE — Telephone Encounter (Signed)
I called to schedule the patient for labs and f/u with Dr. Marjory Lies per the EMR. The patient had an appointment today, but left without checking out. The patients VM was full.

## 2012-12-15 NOTE — Progress Notes (Signed)
[facility]  PATIENT: [name] [name] DOB: [birth year]   HISTORY FROM: patient REASON FOR VISIT: follow up test results, new diagnosis MS   HISTORICAL  CHIEF COMPLAINT:  Chief Complaint  Patient presents with  . Follow-up    HISTORY OF PRESENT ILLNESS: 47 year old right-handed male, with low testosterone, chromosomal abnormality, here for evaluation of possible multiple sclerosis.  For past 10-15 years patient has had intermittent episodes of exertional weakness in his legs. Seems to be worse in the hot weather. Typically he would feel weakness, right leg greater than left, sit down and symptoms would improve. He also noticed over the years back when he would take hot showers his muscles would get very weak. He also recalls episodes of electrical numb sensation in his neck and spine when he would flex his head forward (i.e. Lhermitte sign). He has had some problems with blurred vision in the past. No definite episodes of optic neuritis in the past. No significant diffuse pain. No dry eyes or dry mouth. No significant rash. No family history of MS or other disease.  Recently patient has had increasing episodes of dizziness, nausea, double vision. Patient went to the hospital was admitted (11/03/12-11/05/12), diagnosed with possible multiple sclerosis and benign positional vertigo. He was not treated with IV steroids. He received physical and vestibular therapy, gradually improved and was discharged on. Since going home his dizziness has continued intermittently.  UPDATE 12/15/12 (LL):  Patient returns for follow up for test results.  Evoked Potentials was positive, panANA, Lyme and other labs were negative.  Demyelinating lesions seen in brain, cervical and thoracic spinal cord. Patient states he did not see much improvement while on steroids but has gradually improved since last visit.  He has significant feelings of weakness, fatigue and difficulty ambulating, using a cane  now.  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  Denies eye pain, bowel and bladder problems.  He is accompanied by his wife and mother.  REVIEW OF SYSTEMS: Full 14 system review of systems performed and notable only for:  Constitutional: fatigue  Cardiovascular: N/A  Ear/Nose/Throat: N/A  Skin: N/A  Eyes: blurred vision  Respiratory: N/A  Gastroitestinal: N/A  Hematology/Lymphatic: N/A  Endocrine: N/A Musculoskeletal:N/A  Allergy/Immunology: N/A  Neurological: N/A Psychiatric: decreased energy   ALLERGIES: Allergies  Allergen Reactions  . Penicillins     unknown    HOME MEDICATIONS: Outpatient Prescriptions Prior to Visit  Medication Sig Dispense Refill  . ondansetron (ZOFRAN) 4 MG tablet Take 1 tablet (4 mg total) by mouth every 8 (eight) hours as needed for nausea.  20 tablet  0  . predniSONE (DELTASONE) 10 MG tablet Take 60mg  on day 1. Reduce by 10mg  each subsequent day. (60, 50, 40, 30, 20, 10, stop)  21 tablet  0  . Testosterone (ANDROGEL PUMP) 20.25 MG/ACT (1.62%) GEL Place 3 Squirts onto the skin daily.  450 g  1   No facility-administered medications prior to visit.    PAST MEDICAL HISTORY: Past Medical History  Diagnosis Date  . Testosterone deficiency 11/04/11  . 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 Other postprocedural status(V45.89)     inguinal herniorrhaphies bilateral   . Neuromuscular disorder 10/2012    "high probability of MS"    PAST SURGICAL HISTORY: Past Surgical History  Procedure Laterality Date  . Hernia repair      AT AGE 69    FAMILY HISTORY: Family History  Problem Relation Age of Onset  . Arthritis Mother     SOCIAL HISTORY: History   Social History  . Marital Status: Married    Spouse Name: N/A    Number of Children: N/A  . Years of Education: N/A   Occupational History  . Not on file.   Social History Main Topics  . Smoking status: Former Smoker    Types: Cigarettes    Quit date:  07/14/2010  . Smokeless tobacco: Never Used  . Alcohol Use: No  . Drug Use: No  . Sexual Activity: Yes   Other Topics Concern  . Not on file   Social History Narrative  . No narrative on file   PHYSICAL EXAM  Filed Vitals:   12/15/12 1157  BP: 112/71  Pulse: 100  Temp: 98.2 F (36.8 C)  Height: 5\' 10"  (1.778 m)  Weight: 173 lb (78.472 kg)   Body mass index is 24.82 kg/(m^2).  GENERAL EXAM:  Patient is seated, not in distress, pleasant Caucasian male. CARDIOVASCULAR:  Regular rate and rhythm, no murmurs, no carotid bruits   NEUROLOGIC:  MENTAL STATUS: awake, alert, language fluent, comprehension intact, naming intact  CRANIAL NERVES: RELATIVE RIGHT AFFERENT PUPILLARY DEFECT. DECR LIGHT SENS IN RIGHT EYE. LEFT PUPIL REACTIVE. SACCADIC BREAKDOWN OF SMOOTH PURSUIT. visual fields full to confrontation, extraocular muscles intact, FEW BEATS END GAZE NYSTAGMUS. facial sensation and strength symmetric, uvula midline, shoulder shrug symmetric, tongue midline.  MOTOR: normal bulk and tone, full strength in the BUE, LLE; MILD RIGHT HF WEAKNESS (5-/5).  SENSORY: DECR PP IN RIGHT HAND AND RIGHT ANKLE. DECR VIB IN RIGHT TOES.  COORDINATION: finger-nose-finger, fine finger movements SLOW  REFLEXES: BUE (TRICEPS 3, BICEPS AND BRACHIORAD 2; NEG HOFFMANS; NEG PECTORALIS). BLE (RIGHT KNEE 3+, LEFT KNEE 3, ANKLES 2, POS CROSSED ADDUCTORS AND SUPRAPATELLARS); RIGHT TOE UPGOING; LEFT TOE DOWN.  GAIT/STATION: SLOW UNSTEADY GAIT. CANNOT WALK ON TOES, HEEL OR TANDEM. Romberg is negative. Ambulates with cane.  DIAGNOSTIC DATA (LABS, IMAGING, TESTING) - I reviewed patient records, labs, notes, testing and imaging myself where available.  Lab Results  Component Value Date   WBC 8.8 11/05/2012   HGB 13.7 11/05/2012   HCT 39.8 11/05/2012   MCV 87.3 11/05/2012   PLT 223 11/05/2012      Component Value Date/Time   NA 140 11/05/2012 0435   K 4.5 11/05/2012 0435   CL 108 11/05/2012 0435   CO2 28  11/05/2012 0435   GLUCOSE 95 11/05/2012 0435   BUN 15 11/05/2012 0435   CREATININE 0.79 11/05/2012 0435   CREATININE 0.86 08/30/2012 1117   CALCIUM 8.9 11/05/2012 0435   PROT 7.6 11/03/2012 1545   ALBUMIN 3.9 11/03/2012 1545   AST 16 11/03/2012 1545   ALT 14 11/03/2012 1545   ALKPHOS 48 11/03/2012 1545   BILITOT 0.4 11/03/2012 1545   GFRNONAA >90 11/05/2012 0435   GFRAA >90 11/05/2012 0435   Lab Results  Component Value Date   CHOL 207* 08/30/2012   HDL 48 08/30/2012   LDLCALC 138* 08/30/2012   TRIG 107 08/30/2012   CHOLHDL 4.3 08/30/2012   Lab Results  Component Value Date   HGBA1C 5.3 11/03/2012   No results found for this basename: VITAMINB12   Lab Results  Component Value Date   TSH 1.488 11/03/2012    11/03/12 MRI brain - Advanced white matter disease with superimposed changes in the cerebellum and deep gray matter nuclei. The pattern of disease is most consistent with chronic multiple sclerosis.  No definite acute demyelination. No other intracranial abnormality identified.   Lower Ext Arterial Doppler Study 09/17/12 - Normal  Pan-ANCA, Angiotensin converting enzyme, Lyme Ab/Western Blot Reflex, ANA w/Reflex all negative.  MRI Cervical Spine wo 11/11/12 Abnormal MRI scan of cervical spine showing mild dysplasia and changes as described above without significant compression. Ill-defined spinal cord hyperintensities at C2-3 and C4-5 and C5 and possibly C7 represent remote age demyelinating plaques. No enhancing lesions are noted.  MRI Thoracic Spine wo 11/11/12 Minimally abnormal MRI of thoracic spine showing mild disc degenrativec changes as described above. There is no frank disc herniation, cord compression, or stenosis. (Degenerating plaques noted on review with Dr. Marjory Lies.)  Visual evoked potential test 11/22/12 Abnormal visual evoked potential study. Bilateral P100 latencies are significantly prolonged, consistent with dysfunction of the visual pathways that cannot be further  localized.  ASSESSMENT AND PLAN 47 y.o. 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 MRI and exam findings are consistent with Multiple Sclerosis.  Discussed instituting disease modifying therapy.   PLAN: 1. Check lab for JCV antibody. 2. Gave informational materials about MS diagnosis and treatment options.  Recommended treatment with either Tecfidera or Tysabri.  Patient and family to review and discuss. 3. Follow up in 2-4 weeks to initiate medication therapy.  Orders Placed This Encounter  Procedures  . Stratify JCV Antibody Test ([facility])   Plan of care was discussed with Dr. Joycelyn Schmid who answered patient questions, recommended treatment and reviewed options for treatment with patient and family.  Yu Cragun NP-C 12/15/2012, 1:26 PM  [facility] [address] [place], [place] [address] [phone]

## 2012-12-17 ENCOUNTER — Telehealth: Payer: Self-pay | Admitting: *Deleted

## 2012-12-17 NOTE — Telephone Encounter (Signed)
Patient requesting to have a letter written to extended [facility] for an indefinite amount of time. Advised patient to have FMLA send our office forms. Patient agreed.

## 2012-12-17 NOTE — Telephone Encounter (Signed)
I need to have a doctors note faxed to [facility] by today. Stating that I will be out of work for several more months. Fax: [phone]. Please add my first and last name to the cover sheet.

## 2012-12-20 ENCOUNTER — Telehealth: Payer: Self-pay | Admitting: Family Medicine

## 2012-12-20 NOTE — Telephone Encounter (Signed)
I spoke with pt, he states [facility] will not complete the FMLA since we completed the original.  I spoke with Dr. Susann Givens and he states we only covered the pt until we could get him in to [employer].  There is no way for [place] to know how long [employer] will need to care for this patient.  I call [employer] to speak with site manager Barrett Shell [phone] reached her voice mail and requested she give me a call.

## 2012-12-20 NOTE — Telephone Encounter (Signed)
Patient called and left message on voice mail that he needs a letter extending his FMLA Section 5 due to his diagnosis of MS.  He needs this faxed fo [phone]   Pt ph# [phone]

## 2012-12-20 NOTE — Telephone Encounter (Signed)
I spoke with Site Manager Oren Bracket, she is agreeable with me that they should continue the [facility] and she will take care of.  I called pt and advised him of same.

## 2012-12-21 ENCOUNTER — Telehealth: Payer: Self-pay

## 2012-12-21 ENCOUNTER — Telehealth: Payer: Self-pay | Admitting: Family Medicine

## 2012-12-21 NOTE — Telephone Encounter (Signed)
I called patient and left VM for him that we needed new FMLA form. See note. I did not discuss addending FMLA form  but, when our forms are completed, we send forms out to be scanned. We do not have access to them after a month or so. Thus the reason we would need to have new form.

## 2012-12-21 NOTE — Telephone Encounter (Signed)
Felipa Eth of [facility] called and advised she spoke with pt and advised him he would need to send new FMLA papers to her to complete that she could not addend our FMLA.  I advised her pt is confused from their conversation.  Felipa Eth will call pt and explain again.

## 2012-12-21 NOTE — Telephone Encounter (Signed)
I called patient and left VM again informing him that what we need is a FMLA form so I can complete that. We can not send out a letter with a statement that patient is out indefinitely. I let him know that I spoke with Laureen Ochs at his family practice and discussed what we need in order to process this. I advised, please contact your HR department and they can give you a FMLA form. Should they need to speak with me, please have them contact me. Please call with any further questions or concerns.

## 2012-12-22 ENCOUNTER — Emergency Department: Payer: Self-pay | Admitting: Emergency Medicine

## 2012-12-22 ENCOUNTER — Telehealth: Payer: Self-pay | Admitting: Diagnostic Neuroimaging

## 2012-12-22 NOTE — Progress Notes (Signed)
I reviewed note and agree with plan.   VIKRAM R. PENUMALLI, MD 12/22/2012, 1:33 PM Certified in Neurology, Neurophysiology and Neuroimaging  [facility] [address], [address] [place], [place] [address] [phone]  

## 2012-12-23 ENCOUNTER — Telehealth: Payer: Self-pay | Admitting: Diagnostic Neuroimaging

## 2012-12-23 LAB — URINALYSIS, COMPLETE
Ph: 6 (ref 4.5–8.0)
Protein: NEGATIVE
Specific Gravity: 1.053 (ref 1.003–1.030)
WBC UR: <1 /HPF (ref 0–5)

## 2012-12-23 LAB — CBC
HGB: 14.1 g/dL (ref 13.0–18.0)
MCH: 29.8 pg (ref 26.0–34.0)
MCHC: 34.1 g/dL (ref 32.0–36.0)
MCV: 87 fL (ref 80–100)
Platelet: 241 10*3/uL (ref 150–440)
RBC: 4.75 10*6/uL (ref 4.40–5.90)
RDW: 13.2 % (ref 11.5–14.5)
WBC: 12.4 10*3/uL — ABNORMAL HIGH (ref 3.8–10.6)

## 2012-12-23 LAB — COMPREHENSIVE METABOLIC PANEL WITH GFR
Albumin: 4.1 g/dL
Alkaline Phosphatase: 52 U/L
Anion Gap: 11
BUN: 21 mg/dL — ABNORMAL HIGH
Bilirubin,Total: 0.3 mg/dL
Calcium, Total: 9.5 mg/dL
Chloride: 106 mmol/L
Co2: 25 mmol/L
Creatinine: 1.07 mg/dL
EGFR (African American): >60
EGFR (Non-African Amer.): >60
Glucose: 117 mg/dL — ABNORMAL HIGH
Osmolality: 287
Potassium: 3.7 mmol/L
SGOT(AST): 21 U/L
SGPT (ALT): 22 U/L
Sodium: 142 mmol/L
Total Protein: 7.7 g/dL

## 2012-12-23 LAB — TROPONIN I: Troponin-I: <0.02 ng/mL

## 2012-12-23 LAB — CK TOTAL AND CKMB (NOT AT ARMC): CK-MB: <0.5 ng/mL — ABNORMAL LOW (ref 0.5–3.6)

## 2012-12-23 NOTE — Telephone Encounter (Signed)
Patient called to let me know his employer is faxing me FMLA forms

## 2012-12-24 ENCOUNTER — Telehealth: Payer: Self-pay

## 2012-12-24 NOTE — Telephone Encounter (Signed)
I called patient and let him know forms are ready. He asked that I fax a release to his wife. She will get it Monday and fax it back. I asked patient to call [employer] medical records also on Monday and pay the small fee for completing the form.

## 2012-12-24 NOTE — Telephone Encounter (Signed)
I called patient to let him know I will complete his FMLA form today and send for signature.  Patient wanted to relay to Dr. Marjory Lies that he went to [facility] on December 22, 2012 for exacerbation. They treated him with steroids.

## 2012-12-27 ENCOUNTER — Telehealth: Payer: Self-pay | Admitting: Diagnostic Neuroimaging

## 2012-12-28 ENCOUNTER — Ambulatory Visit: Payer: BC Managed Care – PPO | Admitting: Diagnostic Neuroimaging

## 2012-12-29 ENCOUNTER — Inpatient Hospital Stay: Payer: Self-pay | Admitting: Internal Medicine

## 2012-12-29 ENCOUNTER — Telehealth: Payer: Self-pay | Admitting: Diagnostic Neuroimaging

## 2012-12-29 LAB — URINALYSIS, COMPLETE
Bilirubin,UR: NEGATIVE
Hyaline Cast: 2
Leukocyte Esterase: NEGATIVE
Nitrite: NEGATIVE
Ph: 7 (ref 4.5–8.0)
Protein: NEGATIVE
WBC UR: 1 /HPF (ref 0–5)

## 2012-12-29 LAB — BASIC METABOLIC PANEL
Calcium, Total: 9.1 mg/dL (ref 8.5–10.1)
Co2: 25 mmol/L (ref 21–32)
EGFR (Non-African Amer.): >60
Glucose: 89 mg/dL (ref 65–99)
Osmolality: 277 (ref 275–301)
Potassium: 3.8 mmol/L (ref 3.5–5.1)

## 2012-12-29 LAB — CBC WITH DIFFERENTIAL/PLATELET
Basophil #: 0.1 10*3/uL (ref 0.0–0.1)
Eosinophil #: 0.1 10*3/uL (ref 0.0–0.7)
Lymphocyte #: 2.2 10*3/uL (ref 1.0–3.6)
MCH: 29.5 pg (ref 26.0–34.0)
MCV: 88 fL (ref 80–100)
Monocyte %: 8.5 %
Neutrophil #: 14.8 10*3/uL — ABNORMAL HIGH (ref 1.4–6.5)
RDW: 13.6 % (ref 11.5–14.5)
WBC: 18.7 10*3/uL — ABNORMAL HIGH (ref 3.8–10.6)

## 2012-12-29 LAB — HEPATIC FUNCTION PANEL A (ARMC)
Albumin: 3.3 g/dL — ABNORMAL LOW (ref 3.4–5.0)
SGPT (ALT): 32 U/L (ref 12–78)

## 2012-12-29 NOTE — Telephone Encounter (Signed)
Returning call to home# re: pt sx, going to ED.  I LMVM home to call back.

## 2012-12-30 ENCOUNTER — Encounter: Payer: Self-pay | Admitting: Nurse Practitioner

## 2012-12-30 LAB — CBC WITH DIFFERENTIAL/PLATELET
Basophil #: 0 10*3/uL (ref 0.0–0.1)
Eosinophil %: 0 %
HGB: 12.9 g/dL — ABNORMAL LOW (ref 13.0–18.0)
Lymphocyte #: 0.8 10*3/uL — ABNORMAL LOW (ref 1.0–3.6)
Lymphocyte %: 5.3 %
Monocyte #: 0.1 x10 3/mm — ABNORMAL LOW (ref 0.2–1.0)
Neutrophil #: 13.4 10*3/uL — ABNORMAL HIGH (ref 1.4–6.5)
RBC: 4.33 10*6/uL — ABNORMAL LOW (ref 4.40–5.90)
RDW: 13 % (ref 11.5–14.5)
WBC: 14.3 10*3/uL — ABNORMAL HIGH (ref 3.8–10.6)

## 2012-12-30 LAB — COMPREHENSIVE METABOLIC PANEL
BUN: 15 mg/dL (ref 7–18)
Chloride: 107 mmol/L (ref 98–107)
Creatinine: 0.78 mg/dL (ref 0.60–1.30)
EGFR (Non-African Amer.): >60
Osmolality: 280 (ref 275–301)
Potassium: 4.6 mmol/L (ref 3.5–5.1)
SGOT(AST): 124 U/L — ABNORMAL HIGH (ref 15–37)
SGPT (ALT): 189 U/L — ABNORMAL HIGH (ref 12–78)
Sodium: 138 mmol/L (ref 136–145)

## 2012-12-30 NOTE — Telephone Encounter (Signed)
Wife called back and LM re: at [facility].  He was taken there thinking MS exacerbation but has now found out to need gall bladder surgery.  Apparently has been having back pain and pressure in lower back, n/v and this has caused him to have these MS exac states wife.  I gave her the information about the positive test results of JCV, index of 0.76 which is low risk,  Can consider both tecfidera (slightly safer) and tysabri (more effective).  She wanted percentages and other questions and wanted to be able to talk with Larita Fife or Dr. Marjory Lies about this and then they could decide now vs waiting for an appt and then waiting another week think about and sign p/w.  Her cell # is [phone], wife [name] [name].   I did attempt an appt for this, but due to circumstances of his surgery asking for one of you to call.   Thanks

## 2012-12-31 LAB — CBC WITH DIFFERENTIAL/PLATELET
Basophil #: 0 10*3/uL (ref 0.0–0.1)
Basophil %: 0.1 %
Eosinophil #: 0 10*3/uL (ref 0.0–0.7)
HCT: 37.8 % — ABNORMAL LOW (ref 40.0–52.0)
Lymphocyte %: 5.4 %
MCHC: 33.6 g/dL (ref 32.0–36.0)
MCV: 88 fL (ref 80–100)
RDW: 13.6 % (ref 11.5–14.5)
WBC: 19.5 10*3/uL — ABNORMAL HIGH (ref 3.8–10.6)

## 2013-01-01 LAB — CBC WITH DIFFERENTIAL/PLATELET
Basophil #: 0 10*3/uL (ref 0.0–0.1)
Eosinophil %: 0 %
HCT: 36.7 % — ABNORMAL LOW (ref 40.0–52.0)
HGB: 12.6 g/dL — ABNORMAL LOW (ref 13.0–18.0)
Lymphocyte %: 4.5 %
MCHC: 34.4 g/dL (ref 32.0–36.0)
MCV: 88 fL (ref 80–100)
Monocyte #: 0.8 x10 3/mm (ref 0.2–1.0)
Neutrophil #: 16.8 10*3/uL — ABNORMAL HIGH (ref 1.4–6.5)
Neutrophil %: 90.9 %
Platelet: 296 10*3/uL (ref 150–440)
WBC: 18.5 10*3/uL — ABNORMAL HIGH (ref 3.8–10.6)

## 2013-01-12 ENCOUNTER — Encounter: Payer: Self-pay | Admitting: Family Medicine

## 2013-01-18 ENCOUNTER — Telehealth: Payer: Self-pay

## 2013-01-18 NOTE — Telephone Encounter (Signed)
I called and spoke with [name] [name]. Patient had been in the hospital due to need for gall bladder removal and exacerbation of MS. He is doing well now.  Also, patient's FMLA forms have been submitted. They were submitted December 27, 2012

## 2013-02-01 ENCOUNTER — Telehealth: Payer: Self-pay | Admitting: Family Medicine

## 2013-02-01 NOTE — Telephone Encounter (Signed)
Pt's mother dropped off a parking placard. She states that her son has MS and has gotten to the point that he needs it. Please call his mother at [phone] when ready, I am sending form back in your folder.

## 2013-02-17 ENCOUNTER — Other Ambulatory Visit: Payer: Self-pay

## 2013-03-02 ENCOUNTER — Telehealth: Payer: Self-pay | Admitting: Internal Medicine

## 2013-03-02 NOTE — Telephone Encounter (Signed)
Faxed over medical records to Disability Determination services @ [phone] on 02/25/13

## 2013-03-09 NOTE — Telephone Encounter (Signed)
03/09/2013 °

## 2013-10-10 ENCOUNTER — Ambulatory Visit
Admission: RE | Admit: 2013-10-10 | Discharge: 2013-10-10 | Disposition: A | Discharge status: Home / Self Care | Payer: BC Managed Care – PPO | Source: Ambulatory Visit | Attending: Family Medicine | Admitting: Family Medicine

## 2013-10-10 ENCOUNTER — Ambulatory Visit (INDEPENDENT_AMBULATORY_CARE_PROVIDER_SITE_OTHER): Payer: BC Managed Care – PPO | Admitting: Family Medicine

## 2013-10-10 ENCOUNTER — Encounter: Payer: Self-pay | Admitting: Family Medicine

## 2013-10-10 VITALS — BP 110/68 | HR 70 | Wt 183.0 lb

## 2013-10-10 DIAGNOSIS — R3915 Urgency of urination: Secondary | ICD-10-CM

## 2013-10-10 DIAGNOSIS — R109 Unspecified abdominal pain: Secondary | ICD-10-CM

## 2013-10-10 LAB — POCT URINALYSIS DIPSTICK
Bilirubin, UA: NEGATIVE
Blood, UA: NEGATIVE
Glucose, UA: NEGATIVE
KETONES UA: NEGATIVE
LEUKOCYTES UA: NEGATIVE
NITRITE UA: NEGATIVE
PROTEIN UA: NEGATIVE
Spec Grav, UA: <=1.005
UROBILINOGEN UA: NEGATIVE
pH, UA: 6

## 2013-10-10 NOTE — Progress Notes (Signed)
   Subjective:    Patient ID: [name] [name], male    DOB: [birth year], 48 y.o.   MRN: [id number]  HPI Roughly one week ago he is awakened by left flank pain and urge to urinate. He had been drinking large amounts of fluids to help with presumed symptoms of MS,vertigo. The pain lasted about 2 or 3 hours . No new nausea, vomiting but he did feel diaphoretic and lasted about 2 hours. He woke up he was having some slight discomfort that lasted through the day. He had another attack 2 or 3 nights ago. He does state that in between these 2 attacks he did have urinary urgency and dysuria   Review of Systems     Objective:   Physical Exam Alert and in no distress. Urine dipstick was negative and quite dilute. X-ray was negative.       Assessment & Plan:  Urinary urgency - Plan: POCT Urinalysis Dipstick, DG Abd 1 View  Left flank pain - Plan: DG Abd 1 View  recommend he cut back on his fluids because he certainly keeping himself very hydrated. Also recommend that he come here or go to the emergency room if the office is not open, the next time he has the flank pain. It certainly sounds like this could be renal colic. Does not sound like prostate related symptoms.

## 2013-10-21 ENCOUNTER — Ambulatory Visit (INDEPENDENT_AMBULATORY_CARE_PROVIDER_SITE_OTHER): Payer: BC Managed Care – PPO | Admitting: Family Medicine

## 2013-10-21 ENCOUNTER — Encounter: Payer: Self-pay | Admitting: Family Medicine

## 2013-10-21 VITALS — BP 118/80 | HR 77 | Ht 69.5 in | Wt 182.0 lb

## 2013-10-21 DIAGNOSIS — R5383 Other fatigue: Secondary | ICD-10-CM

## 2013-10-21 DIAGNOSIS — Z1211 Encounter for screening for malignant neoplasm of colon: Secondary | ICD-10-CM

## 2013-10-21 DIAGNOSIS — E291 Testicular hypofunction: Secondary | ICD-10-CM

## 2013-10-21 DIAGNOSIS — Z0000 Encounter for general adult medical examination without abnormal findings: Secondary | ICD-10-CM

## 2013-10-21 DIAGNOSIS — R531 Weakness: Secondary | ICD-10-CM

## 2013-10-21 DIAGNOSIS — G35 Multiple sclerosis: Secondary | ICD-10-CM

## 2013-10-21 DIAGNOSIS — R5381 Other malaise: Secondary | ICD-10-CM

## 2013-10-21 DIAGNOSIS — R269 Unspecified abnormalities of gait and mobility: Secondary | ICD-10-CM

## 2013-10-21 LAB — LIPID PANEL
CHOL/HDL RATIO: 5.9 ratio
Cholesterol: 219 mg/dL — ABNORMAL HIGH (ref 0–200)
HDL: 37 mg/dL — ABNORMAL LOW (ref >39)
LDL CALC: 140 mg/dL — AB (ref 0–99)
Triglycerides: 210 mg/dL — ABNORMAL HIGH (ref <150)
VLDL: 42 mg/dL — ABNORMAL HIGH (ref 0–40)

## 2013-10-21 LAB — COMPREHENSIVE METABOLIC PANEL
ALBUMIN: 4.6 g/dL (ref 3.5–5.2)
ALK PHOS: 63 U/L (ref 39–117)
ALT: 10 U/L (ref 0–53)
AST: 14 U/L (ref 0–37)
BUN: 22 mg/dL (ref 6–23)
CALCIUM: 9.4 mg/dL (ref 8.4–10.5)
CHLORIDE: 105 meq/L (ref 96–112)
CO2: 28 mEq/L (ref 19–32)
CREATININE: 0.87 mg/dL (ref 0.50–1.35)
Glucose, Bld: 88 mg/dL (ref 70–99)
POTASSIUM: 4.1 meq/L (ref 3.5–5.3)
Sodium: 141 mEq/L (ref 135–145)
Total Bilirubin: 0.4 mg/dL (ref 0.2–1.2)
Total Protein: 7 g/dL (ref 6.0–8.3)

## 2013-10-21 LAB — CBC WITH DIFFERENTIAL/PLATELET
BASOS ABS: 0 10*3/uL (ref 0.0–0.1)
BASOS PCT: 0 % (ref 0–1)
Eosinophils Absolute: 0.2 10*3/uL (ref 0.0–0.7)
Eosinophils Relative: 2 % (ref 0–5)
HCT: 38.3 % — ABNORMAL LOW (ref 39.0–52.0)
Hemoglobin: 13.1 g/dL (ref 13.0–17.0)
LYMPHS PCT: 37 % (ref 12–46)
Lymphs Abs: 3.7 10*3/uL (ref 0.7–4.0)
MCH: 28.9 pg (ref 26.0–34.0)
MCHC: 34.2 g/dL (ref 30.0–36.0)
MCV: 84.4 fL (ref 78.0–100.0)
Monocytes Absolute: 1.2 10*3/uL — ABNORMAL HIGH (ref 0.1–1.0)
Monocytes Relative: 12 % (ref 3–12)
NEUTROS ABS: 4.9 10*3/uL (ref 1.7–7.7)
NEUTROS PCT: 49 % (ref 43–77)
PLATELETS: 297 10*3/uL (ref 150–400)
RBC: 4.54 MIL/uL (ref 4.22–5.81)
RDW: 14.3 % (ref 11.5–15.5)
WBC: 10 10*3/uL (ref 4.0–10.5)

## 2013-10-21 NOTE — Patient Instructions (Signed)
Concentrate on what you can do as opposed to what you can't do. [name] [name] [phone]

## 2013-10-21 NOTE — Progress Notes (Signed)
   Subjective:    Patient ID: [name] [name], male    DOB: [birth year], 48 y.o.   MRN: [id number]  HPI He is here for a complete examination. He was diagnosed with multiple sclerosis, relaxing  remitting type and is getting monthly IV therapy for this at [facility]. He is disabled because of this and does use a cane. Psychologically this has been very difficult for him to deal with. He does note weakness but states the IV therapy does help but does tend to wear off towards the end of the month. He also has history of hypogonadism and presently is on no medication.   Review of Systems  All other systems reviewed and are negative.      Objective:   Physical Exam BP 118/80  Pulse 77  Ht 5' 9.5" (1.765 m)  Wt 182 lb (82.555 kg)  BMI 26.50 kg/m2  General Appearance:    Alert, cooperative, no distress, appears stated age  Head:    Normocephalic, without obvious abnormality, atraumatic  Eyes:    PERRL, conjunctiva/corneas clear, EOM's intact, fundi    benign  Ears:    Normal TM's and external ear canals  Nose:   Nares normal, mucosa normal, no drainage or sinus   tenderness  Throat:   Lips, mucosa, and tongue normal; teeth and gums normal  Neck:   Supple, no lymphadenopathy;  thyroid:  no   enlargement/tenderness/nodules; no carotid   bruit or JVD  Back:    Spine nontender, no curvature, ROM normal, no CVA     tenderness  Lungs:     Clear to auscultation bilaterally without wheezes, rales or     ronchi; respirations unlabored  Chest Wall:    No tenderness or deformity   Heart:    Regular rate and rhythm, S1 and S2 normal, no murmur, rub   or gallop  Breast Exam:    No chest wall tenderness, masses or gynecomastia  Abdomen:     Soft, non-tender, nondistended, normoactive bowel sounds,    no masses, no hepatosplenomegaly        Extremities:   No clubbing, cyanosis or edema  Pulses:   2+ and symmetric all extremities  Skin:   Skin color, texture, turgor normal, no rashes or lesions   Lymph nodes:   Cervical, supraclavicular, and axillary nodes normal  Neurologic:   CNII-XII intact, normal strength, sensation and gait; reflexes 2+ and symmetric throughout          Psych:   Normal mood, affect, hygiene and grooming.          Assessment & Plan:  Routine general medical examination at a health care facility - Plan: CBC with Differential, Comprehensive metabolic panel, Lipid panel  Weakness  Multiple sclerosis  Hypogonadism male  Gait difficulty  Special screening for malignant neoplasms, colon - Plan: Ambulatory referral to Gastroenterology  I talked to him at length concerning the psychological aspects of this. Encouraged him to look at the things that he could do rather than what he can't do. Also recommend followup counseling with Darryl Hyers.

## 2013-10-25 ENCOUNTER — Encounter: Payer: Self-pay | Admitting: Gastroenterology

## 2013-12-22 ENCOUNTER — Ambulatory Visit (AMBULATORY_SURGERY_CENTER): Payer: BC Managed Care – PPO | Admitting: *Deleted

## 2013-12-22 ENCOUNTER — Telehealth: Payer: Self-pay | Admitting: *Deleted

## 2013-12-22 VITALS — Ht 70.0 in | Wt 183.8 lb

## 2013-12-22 DIAGNOSIS — Z1211 Encounter for screening for malignant neoplasm of colon: Secondary | ICD-10-CM

## 2013-12-22 NOTE — Telephone Encounter (Signed)
[name] [name]: pt is scheduled for screening colonoscopy with Dr. Fuller Plan 01/05/14.  Pt was diagnosed with MS in 2014.  He said he had cholecystectomy 01/2013 and was told he was hard to wake up after surgery due to his MS.  Is pt okay for [facility] from anesthesia perspective?  Thanks, Juliann Pulse

## 2013-12-22 NOTE — Progress Notes (Signed)
No allergies to eggs or soy. Hard to wake up with cholecystectomy  Pt given Emmi instructions for colonoscopy  No oxygen use  No diet drug use

## 2013-12-23 NOTE — Telephone Encounter (Signed)
[name] [name],  This patient is cleared for care at [facility].  Thanks,  Jenny Reichmann

## 2013-12-23 NOTE — Telephone Encounter (Signed)
Pt aware that ok for colonoscopy at [facility]

## 2013-12-30 ENCOUNTER — Encounter: Payer: Self-pay | Admitting: Gastroenterology

## 2014-01-04 ENCOUNTER — Telehealth: Payer: Self-pay | Admitting: Gastroenterology

## 2014-01-04 NOTE — Telephone Encounter (Signed)
Returned patient's and he was concerned about his Dulcolax tablets he had for prep.  He stated that it was overnight tablets and I advised that it is the same, it just says overnight relief.  Advised to take as directed on his instructions and he agreed to do so. Asked patient to call back if he has any further questions or concerns.

## 2014-01-05 ENCOUNTER — Encounter: Payer: Self-pay | Admitting: Gastroenterology

## 2014-01-05 ENCOUNTER — Ambulatory Visit (AMBULATORY_SURGERY_CENTER): Payer: BC Managed Care – PPO | Admitting: Gastroenterology

## 2014-01-05 VITALS — BP 118/79 | HR 74 | Temp 95.8°F | Resp 16 | Ht 70.0 in | Wt 183.0 lb

## 2014-01-05 DIAGNOSIS — D126 Benign neoplasm of colon, unspecified: Secondary | ICD-10-CM

## 2014-01-05 DIAGNOSIS — D123 Benign neoplasm of transverse colon: Secondary | ICD-10-CM

## 2014-01-05 DIAGNOSIS — Z1211 Encounter for screening for malignant neoplasm of colon: Secondary | ICD-10-CM

## 2014-01-05 MED ORDER — SODIUM CHLORIDE 0.9 % IV SOLN: 500.0000 mL | INTRAVENOUS | Status: DC

## 2014-01-05 NOTE — Patient Instructions (Signed)
YOU HAD AN ENDOSCOPIC PROCEDURE TODAY AT THE [facility]: Refer to the procedure report that was given to you for any specific questions about what was found during the examination.  If the procedure report does not answer your questions, please call your gastroenterologist to clarify.  If you requested that your care partner not be given the details of your procedure findings, then the procedure report has been included in a sealed envelope for you to review at your convenience later.  YOU SHOULD EXPECT: Some feelings of bloating in the abdomen. Passage of more gas than usual.  Walking can help get rid of the air that was put into your GI tract during the procedure and reduce the bloating. If you had a lower endoscopy (such as a colonoscopy or flexible sigmoidoscopy) you may notice spotting of blood in your stool or on the toilet paper. If you underwent a bowel prep for your procedure, then you may not have a normal bowel movement for a few days.  DIET: Your first meal following the procedure should be a light meal and then it is ok to progress to your normal diet.  A half-sandwich or bowl of soup is an example of a good first meal.  Heavy or fried foods are harder to digest and may make you feel nauseous or bloated.  Likewise meals heavy in dairy and vegetables can cause extra gas to form and this can also increase the bloating.  Drink plenty of fluids but you should avoid alcoholic beverages for 24 hours.  ACTIVITY: Your care partner should take you home directly after the procedure.  You should plan to take it easy, moving slowly for the rest of the day.  You can resume normal activity the day after the procedure however you should NOT DRIVE or use heavy machinery for 24 hours (because of the sedation medicines used during the test).    SYMPTOMS TO REPORT IMMEDIATELY: A gastroenterologist can be reached at any hour.  During normal business hours, 8:30 AM to 5:00 PM Monday through Friday,  call [phone].  After hours and on weekends, please call the GI answering service at [phone] who will take a message and have the physician on call contact you.   Following lower endoscopy (colonoscopy or flexible sigmoidoscopy):  Excessive amounts of blood in the stool  Significant tenderness or worsening of abdominal pains  Swelling of the abdomen that is new, acute  Fever of 100F or higher  Following upper endoscopy (EGD)  Vomiting of blood or coffee ground material  New chest pain or pain under the shoulder blades  Painful or persistently difficult swallowing  New shortness of breath  Fever of 100F or higher  Black, tarry-looking stools  FOLLOW UP: If any biopsies were taken you will be contacted by phone or by letter within the next 1-3 weeks.  Call your gastroenterologist if you have not heard about the biopsies in 3 weeks.  Our staff will call the home number listed on your records the next business day following your procedure to check on you and address any questions or concerns that you may have at that time regarding the information given to you following your procedure. This is a courtesy call and so if there is no answer at the home number and we have not heard from you through the emergency physician on call, we will assume that you have returned to your regular daily activities without incident.  SIGNATURES/CONFIDENTIALITY: You and/or your care   partner have signed paperwork which will be entered into your electronic medical record.  These signatures attest to the fact that that the information above on your After Visit Summary has been reviewed and is understood.  Full responsibility of the confidentiality of this discharge information lies with you and/or your care-partner.  Polyp information given.

## 2014-01-05 NOTE — Progress Notes (Signed)
Called to room to assist during endoscopic procedure.  Patient ID and intended procedure confirmed with present staff. Received instructions for my participation in the procedure from the performing physician.  

## 2014-01-05 NOTE — Progress Notes (Signed)
Report to PACU, RN, vss, BBS= Clear.  

## 2014-01-05 NOTE — Op Note (Signed)
[employer] [address] [place], [address]   COLONOSCOPY PROCEDURE REPORT  PATIENT: [name] [name], [name] [name]  MR#: [id number] BIRTHDATE: May 29, 1965 , 48  yrs. old GENDER: male ENDOSCOPIST: Ladene Artist, MD, [facility] REFERRED OM:AYOK [employer], M.D. PROCEDURE DATE:  01/05/2014 PROCEDURE:   Colonoscopy with snare polypectomy First Screening Colonoscopy - Avg.  risk and is 50 yrs.  old or older Yes.  Prior Negative Screening - Now for repeat screening. N/A  History of Adenoma - Now for follow-up colonoscopy & has been > or = to 3 yrs.  N/A  Polyps Removed Today? Yes. ASA CLASS:   Class II INDICATIONS:average risk for colorectal cancer. MEDICATIONS: Monitored anesthesia care and Propofol 250 mg IV DESCRIPTION OF PROCEDURE:   After the risks benefits and alternatives of the procedure were thoroughly explained, informed consent was obtained.  The digital rectal exam revealed no abnormalities of the rectum.   The     endoscope was introduced through the anus and advanced to the cecum, which was identified by both the appendix and ileocecal valve. No adverse events experienced.   The quality of the prep was good, using MoviPrep The instrument was then slowly withdrawn as the colon was fully examined.  COLON FINDINGS: A sessile polyp measuring 10 mm in size with a mucous cap was found in the proximal transverse colon. A polypectomy was performed in a piecemeal fashion using snare cautery.  The resection was complete, the polyp tissue was completely retrieved and sent to histology.   A sessile polyp measuring 8 mm in size was found in the distal transverse colon.  A polypectomy was performed using snare cautery. The resection was complete, the polyp tissue was completely retrieved and sent to histology. The colon mucosa was otherwise normal.  Retroflexed views revealed internal Grade I hemorrhoids. The time to cecum=3 minutes 19 seconds.  Withdrawal time=13 minutes 59 seconds.   The scope was withdrawn and the procedure completed.  COMPLICATIONS: There were no complications.  ENDOSCOPIC IMPRESSION: 1.   Sessile polyp in the proximal transverse colon; polypectomy performed in a piecemeal fashion using snare cautery 2.   Sessile polyp in the distal transverse colon; polypectomy performed using snare cautery 3.   Grade I internal hemorrhoids  RECOMMENDATIONS: 1.  Hold aspirin, aspirin products, and anti-inflammatory medication for 2 weeks. 2.  Await pathology results 3.  Repeat colonoscopy in 2 years if larger polyp adenomatous; 5 years if second polyp adenomatous: otherwise 10 years  eSigned:  Ladene Artist, MD, [facility] 01/05/2014 10:32 AM      PATIENT NAME:  [name], [name] MR#: [id number]

## 2014-01-06 ENCOUNTER — Telehealth: Payer: Self-pay | Admitting: *Deleted

## 2014-01-06 NOTE — Telephone Encounter (Signed)
  Follow up Call-  Call back number 01/05/2014  Post procedure Call Back phone  # [phone]  Permission to leave phone message Yes     Patient questions:  Message left to call us if necessary.

## 2014-01-10 ENCOUNTER — Encounter: Payer: Self-pay | Admitting: Gastroenterology

## 2014-01-27 ENCOUNTER — Other Ambulatory Visit: Payer: Self-pay

## 2014-02-14 IMAGING — US ABDOMEN ULTRASOUND
1 series · 14 of 25 positions shown · non-contrast
Comparison: none

REASON FOR EXAM: right sided abdominal pain, recent CT with dilated CBD
COMMENTS:

[Series 1: abdomen ultrasound · 0.25mm/px · 14 of 57 slices shown]
[im 1/57]
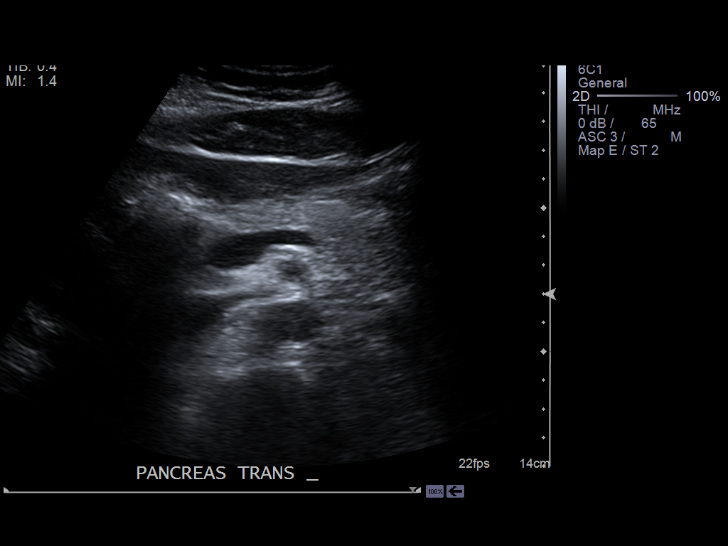
[im 5/57]
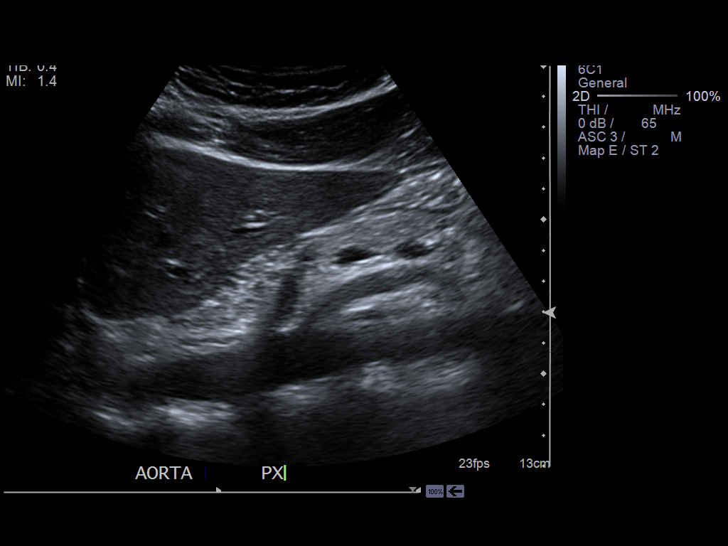
[im 10/57]
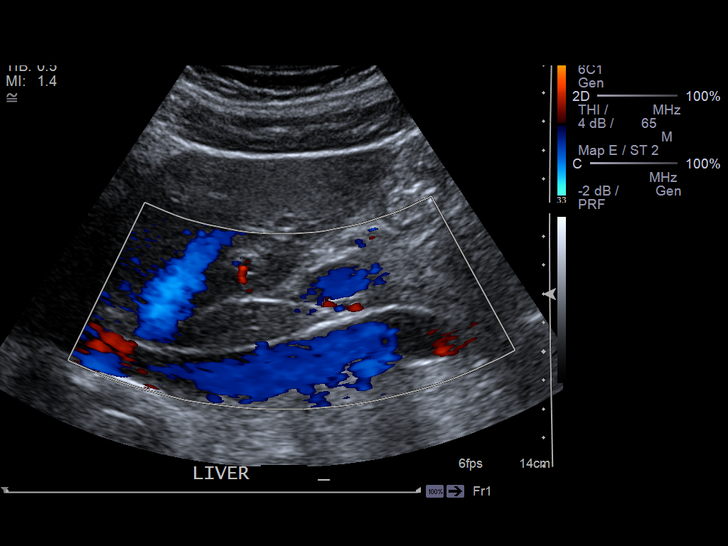
[im 15/57]
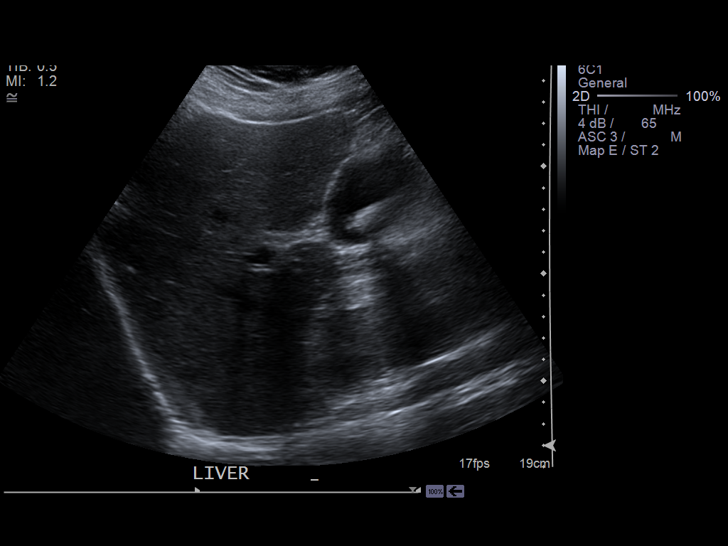
[im 19/57]
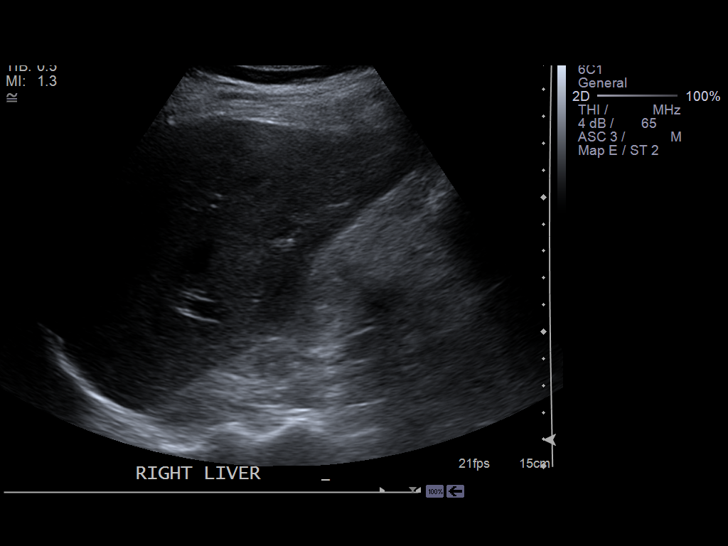
[im 22/57]
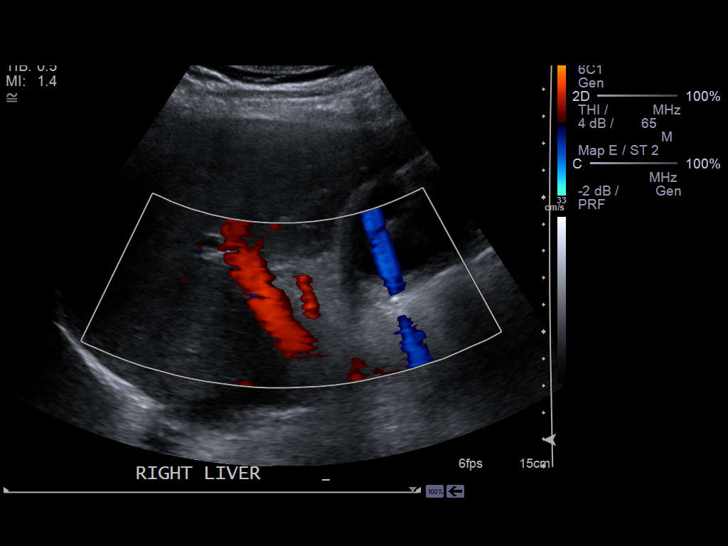
[im 26/57]
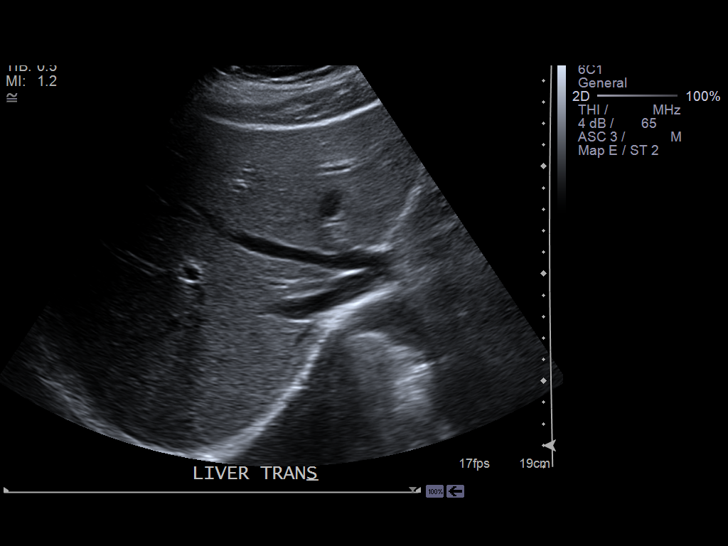
[im 31/57]
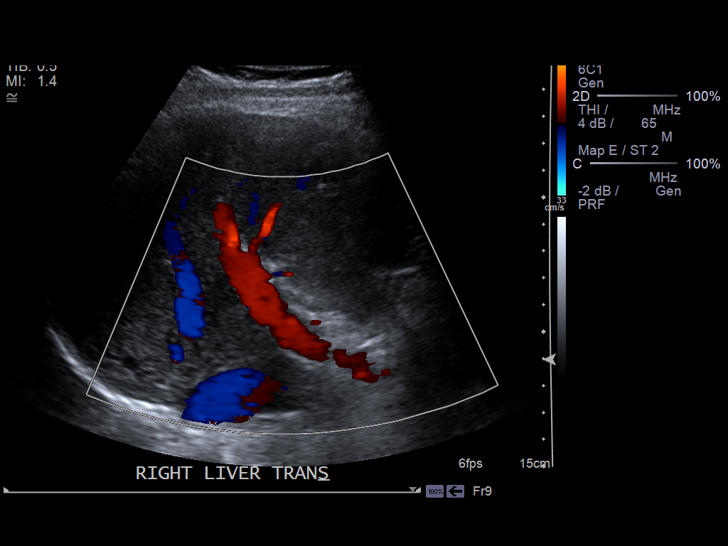
[im 36/57]
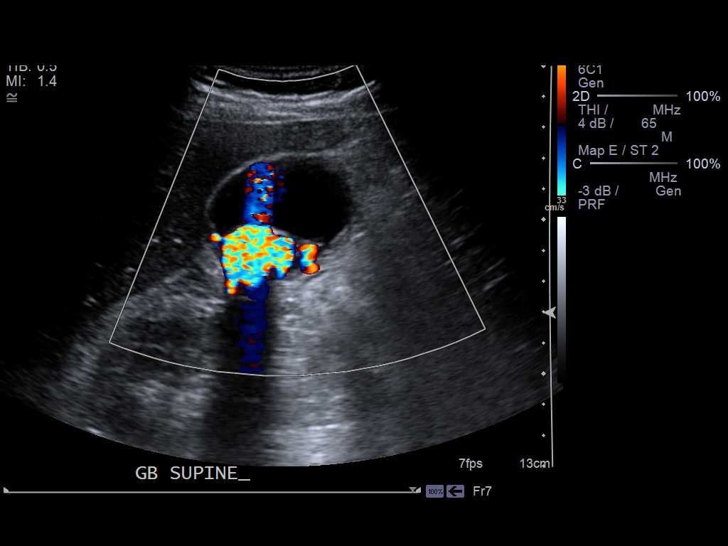
[im 38/57]
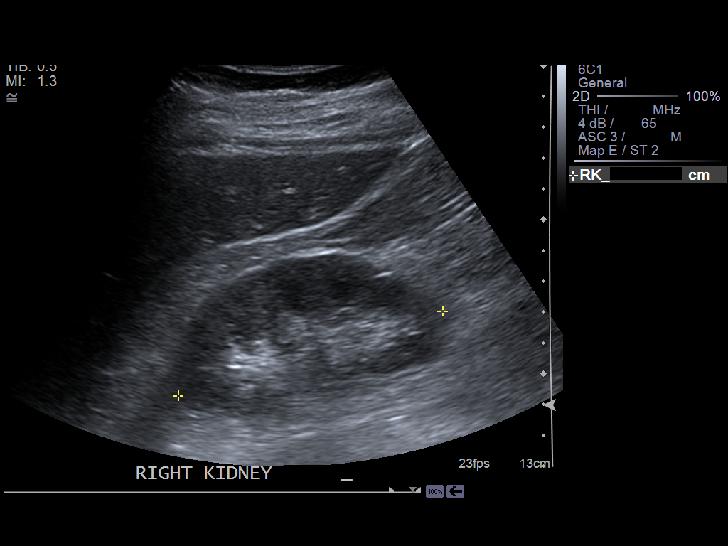
[im 43/57]
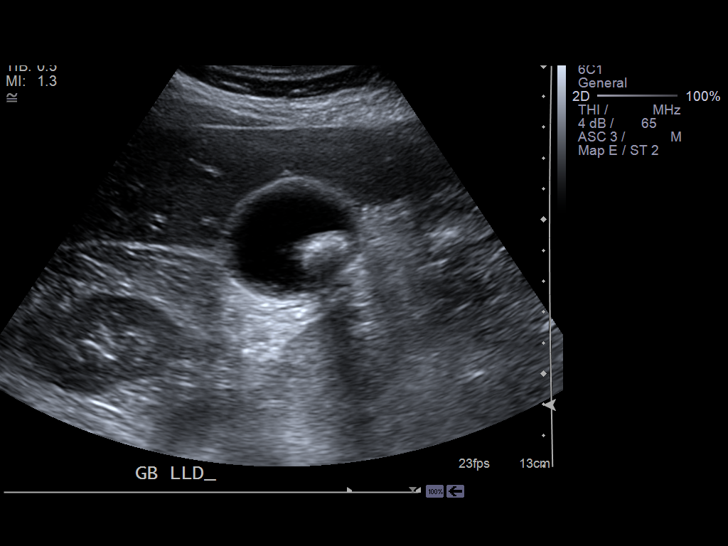
[im 47/57]
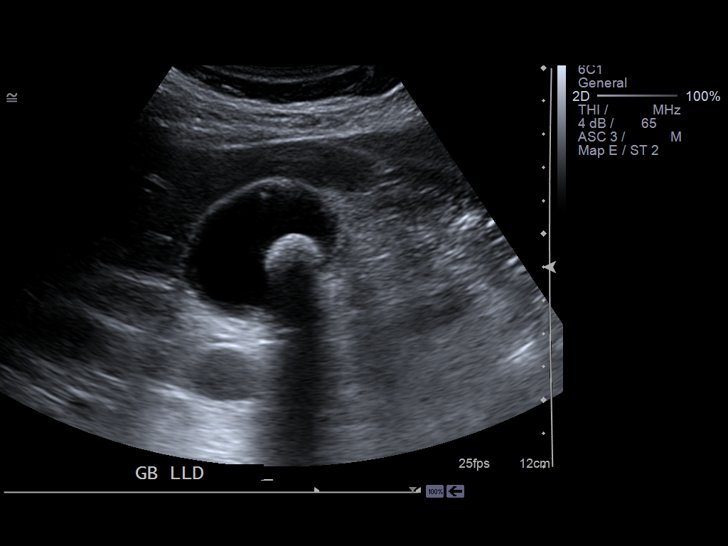
[im 52/57]
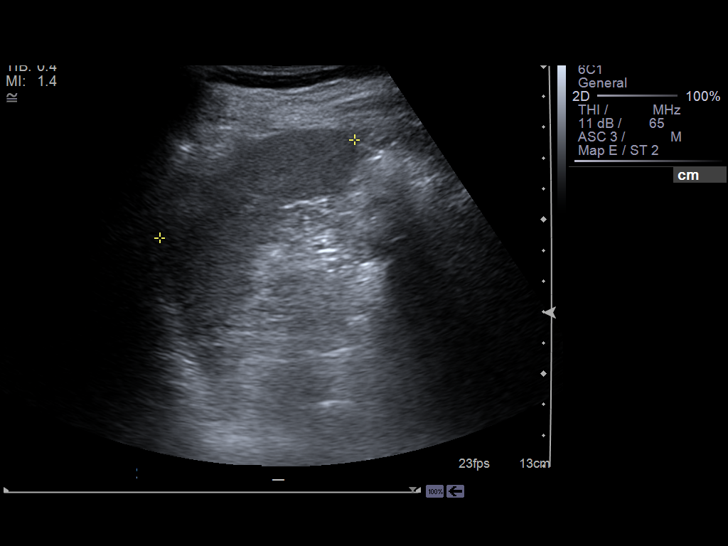
[im 57/57]
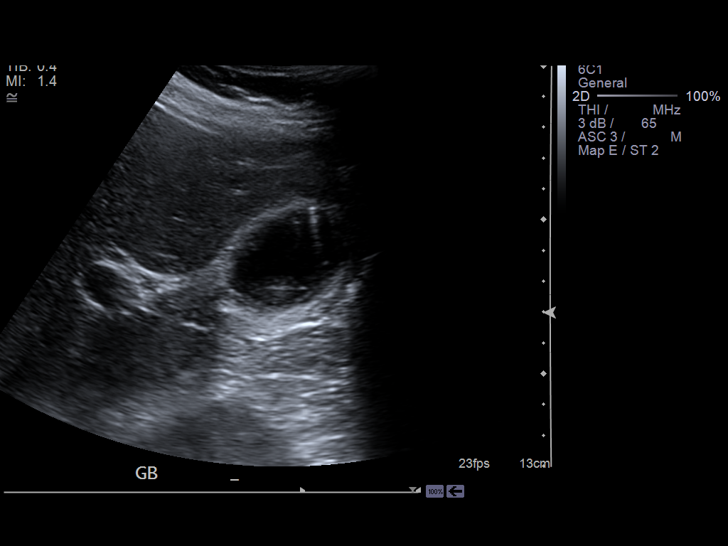

[14 of 25 positions shown; findings below may reference images not displayed]

PROCEDURE:     US  - US ABDOMEN GENERAL SURVEY  - December 30, 2012  [DATE]

RESULT:     The liver exhibits normal echotexture with no focal mass nor
ductal dilation. Portal venous flow is normal in direction toward the liver.
The gallbladder is adequately distended and contains a 2 cm diameter
echogenic mobile shadowing stone. There is no positive sonographic Murphy's
sign. There is gallbladder wall thickening to just over 6 mm. The common
bile duct is normal in caliber at 5.4 millimeters.

The pancreas, spleen, kidneys, abdominal aorta, and inferior vena cava are
normal in appearance. The right kidney is smaller than the left by
approximately 1.8 cm.
IMPRESSION: 1. The common bile duct does not appear dilated on this study.
2. There is a large mobile gallstone and there is gallbladder wall
thickening. There is no positive sonographic Murphy's sign.
3. The other abdominal viscera exhibit no acute abnormalities.
4. The abdominal aorta and inferior vena cava appear normal as well.

[REDACTED]

## 2014-07-25 IMAGING — XA DG CHOLANGIOGRAM OPERATIVE
1 series · 1 of 1 positions shown · non-contrast
Comparison: none

REASON FOR EXAM: Cholelithiasis
COMMENTS:

[Series 1: cont. · 1 of 1 slices shown]
[im 1/1]
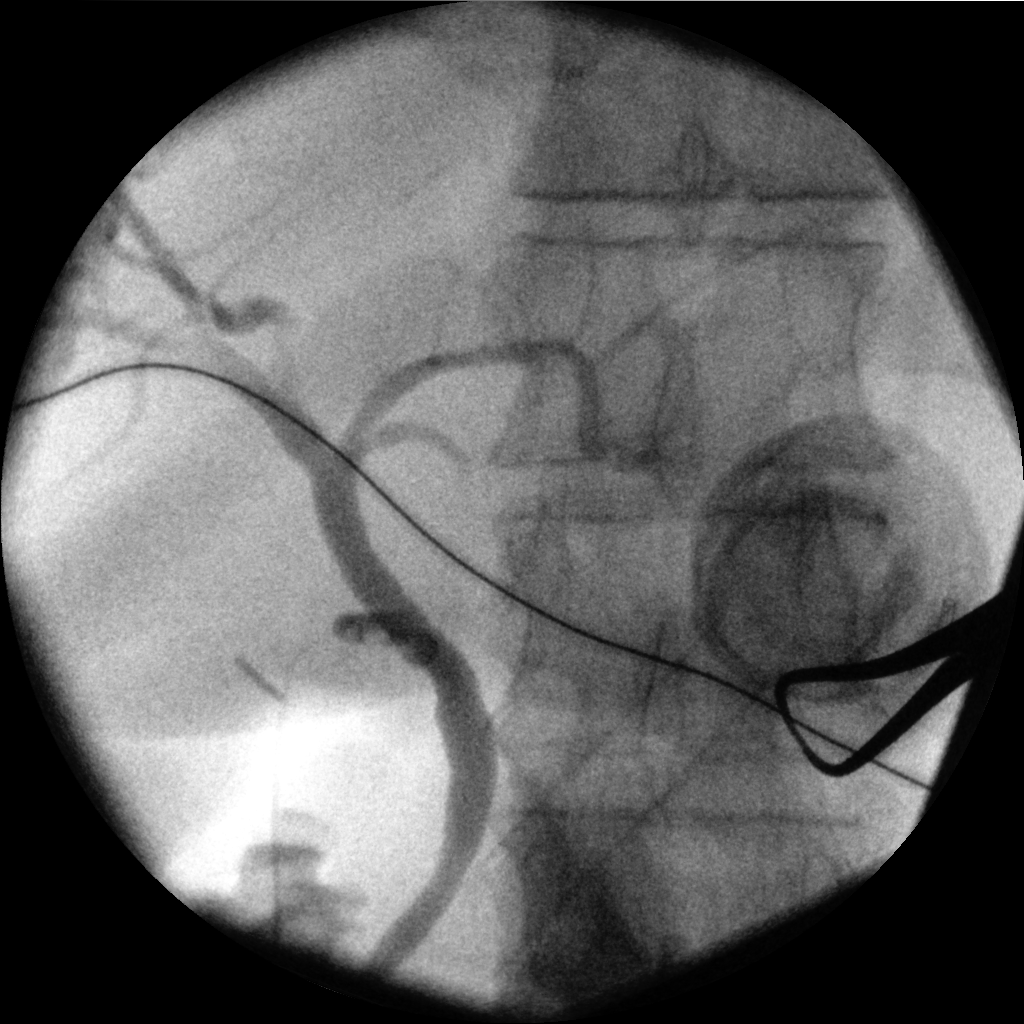

[1 of 1 positions shown; findings below may reference images not displayed]

PROCEDURE:     DXR - DXR CHOLANGIOGRAM OP (INITIAL)  - December 31, 2012 [DATE]

RESULT:     Single image from an intraoperative cholangiogram does not show
the junction of the common bile duct and the duodenum. There is evidence of
contrast within the duodenum. The cystic duct remnant shows no definite
filling defect. The left and right hepatic ducts, common hepatic duct and
visualized common bile duct appear to be normal.
IMPRESSION: Please see above.

[REDACTED]

## 2014-08-04 NOTE — H&P (Signed)
PATIENT NAME:  [name] [name], [name] [name] MR#:  [id number] DATE OF BIRTH:  10-Jun-1965  DATE OF ADMISSION:  12/29/2012  ADMITTING PHYSICIAN: Gladstone Lighter, MD  PRIMARY CARE PHYSICIAN: [place], Dr. Judeth Porch.   PRIMARY NEUROLOGIST: With [facility].  CHIEF COMPLAINT: Pain through the spine, headache and right upper quadrant abdominal pain.   HISTORY OF PRESENT ILLNESS: [name] [name] is a 49 year old now with past medical history significant for recently diagnosed multiple sclerosis 2 months ago by [facility] based on outpatient imaging studies. Presents to the hospital secondary to pain all over the body. The patient states he has been having intermittent neurological symptoms for more than 15 years, but nobody was able to diagnose up until 2 months ago. He has being following with [facility]. Had MRI done and had an admission at [facility] as well and was told that this is an unusual presentation with possible multiple sclerosis. He does not take any maintenance medications at home. He was doing fine up until last week. He presented to the ER with pain through his spine, headache, was treated with IV steroids and was discharged on 50 mg of prednisone for 5 days. He felt that his symptoms were getting better at home, but 2 days ago finished his prednisone and felt his symptoms are back. Most of the symptoms are pain all over the body, headache, especially pain through the back and the spine. He had right upper quadrant abdominal pain last time and even now, which is very debilitating. His LFTs look normal. He had a CT of his abdomen done last week in the ER, which showed slightly dilated common bile duct but no other abnormality, no gallstones seen. Morphine had helped in the last ER visit, but now even with the morphine the right upper quadrant abdominal pain seems to be intense. He denies any other neurological symptoms at this time.   PAST MEDICAL HISTORY: Recently diagnosed with  multiple sclerosis.   PAST SURGICAL HISTORY: Inguinal hernia repair when he was young.   ALLERGIES TO MEDICATIONS: PENICILLIN.  CURRENT HOME MEDICATIONS: Recently finished prednisone, otherwise none.  SOCIAL HISTORY: Lives at home with wife. No history of any smoking or alcohol use. 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  FAMILY HISTORY: Feels both his parents are healthy and unaware of any other medical problems in them.   REVIEW OF SYSTEMS:   CONSTITUTIONAL: No fever, fatigue or weakness.  EYES: No blurred vision, double vision, glaucoma or cataracts.  EARS, NOSE, THROAT: No tinnitus, ear pain, hearing loss, epistaxis or discharge.  RESPIRATORY: No cough, wheeze, hemoptysis or COPD.  CARDIOVASCULAR: No chest pain, orthopnea, edema, arrhythmia, palpitations or syncope.  GASTROINTESTINAL: No nausea, vomiting, diarrhea, constipation, hematemesis or melena. Positive for right upper quadrant abdominal pain.  GENITOURINARY: No dysuria, hematuria or frequency of urination.  ENDOCRINE: No polyuria, nocturia, thyroid problems, heat or cold intolerance.  HEMATOLOGIC: No anemia, easy bruising or bleeding.  SKIN: No acne, rash or lesions.  MUSCULOSKELETAL: Positive for pain all over, especially through the spine, and also headache from his MS. No arthritis or gout.  NEUROLOGIC: No numbness, weakness, CVA, TIA or seizures.  PSYCHOLOGICAL: No anxiety, insomnia, depression.   PHYSICAL EXAMINATION: VITAL SIGNS: Temperature 98.6 Fahrenheit, pulse 108, respirations 20, blood pressure 121/59, pulse oximetry 98% on room air.  GENERAL: Well-built, well-nourished male lying in bed, not in any acute distress.  HEENT: Normocephalic, atraumatic. Pupils equal, round, reacting to light. Anicteric sclerae. Extraocular movements intact. Oropharynx clear without erythema,  mass or icterus.  NECK: Supple. No thyromegaly, JVD or carotid bruits. No lymphadenopathy.  LUNGS: Moving air  bilaterally. No wheeze or crackles. No use of accessory muscles for breathing.  CARDIOVASCULAR: S1, S2, regular rate and rhythm. No murmurs, rubs or gallops.  ABDOMEN: Very tender in the right upper quadrant region with voluntary guarding. No rigidity. Normal bowel sounds are present. No hepatosplenomegaly.  EXTREMITIES: No pedal edema, no clubbing or cyanosis, 2+ dorsalis pedis pulses palpable bilaterally.  SKIN: No acne, rash or lesions. LYMPHATIC: No cervical or inguinal lymphadenopathy.  NEUROLOGIC: Cranial nerves seem to be intact, II through XII. Deep tendon reflexes 2+ symmetric bilaterally. Motor strength 5/5 all 4 extremities and sensation is intact.  PSYCHIATRIC: The patient is awake, alert, oriented x 3.   LABORATORY AND RADIOLOGICAL DATA: WBC is 18.7, hemoglobin 13.6, hematocrit 40.3, platelet count 264.   Sodium potassium 137, potassium 3.8, chloride 103, bicarbonate 25, BUN 23, creatinine 0.78, glucose 89 and calcium of 9.1.   Urinalysis negative for any infection.  Recent CT of the abdomen showing mild prominence of intrahepatic biliary ductal system with common bile duct dilatations in the area of the extrahepatic and intrahepatic portion of the common bile duct; correlate with LFTs. Otherwise, no gallstones seen.   ASSESSMENT AND PLAN: This is a 49 year old male with recent diagnosis of multiple sclerosis, following with [facility], presents with severe pain all over, typical for his multiple sclerosis according to him. Failed outpatient oral prednisone and is presenting again with similar symptoms. 1.  Multiple sclerosis flare, pain through his spine and back, headache. Has had imaging studies as an outpatient so will not repeat the MRI. We need to get records from [facility]. Continue pain management. Solu-Medrol at 1 gram IV daily started and neurology has been consulted.  2.  Right upper quadrant abdominal pain. He feels it could be related to his multiple  sclerosis. However, with recent CT showing mild dilatation of common bile duct, will get a repeat ultrasound, keep him n.p.o., check LFTs again, start IV Protonix b.i.d., and GI has been consulted.  3.  Leukocytosis, likely from recent intake of prednisone. 4.  Gastrointestinal and deep venous thrombosis prophylaxis: On IV Protonix and TEDs and sequential compression devices.   CODE STATUS: Full code.   TIME SPENT ON ADMISSION: 50 minutes.    ____________________________ Gladstone Lighter, MD rk:jm D: 12/29/2012 16:21:23 ET T: 12/29/2012 17:25:18 ET JOB#: [id number]  cc: Gladstone Lighter, MD, <Dictator> [facility] Gladstone Lighter MD ELECTRONICALLY SIGNED 12/30/2012 10:39

## 2014-08-04 NOTE — Discharge Summary (Signed)
PATIENT NAME:  [name] [name], [name] [name] MR#:  [id number] DATE OF BIRTH:  October 05, 1965  DATE OF ADMISSION:  12/29/2012 DATE OF DISCHARGE:  01/01/2013  ADMITTING DIAGNOSIS:  Multiple sclerosis flare.  DISCHARGE DIAGNOSES: 1.  Suspected multiple sclerosis flare  2.  Acute cholecystitis with cholelithiasis status post cholecystectomy on 12/31/2012 by Dr. Rexene Edison 3.  Leukocytosis due to steroids. 4.  Constipation.   DISCHARGE CONDITION:  Stable.   DISCHARGE MEDICATIONS:  The patient is to resume: 1.  Tylenol 325 mg 2 tablets every 4 hours as needed. 2.  Acetaminophen/oxycodone 325/5 mg 1 tablet every 4 hours as needed. 3.  Zofran 4 mg 3 times daily as needed. 4.  Colace 100 mg p.o. twice daily.  5.  Senna 1 tablet once daily at bedtime.   HOME OXYGEN:  None.   DIET:  Regular, mechanical soft consistency.   ACTIVITY LIMITATIONS:  As tolerated.    FOLLOWUP APPOINTMENT: With Dr. Melrose Nakayama in 1 to 2 weeks after discharge, Dr. Rexene Edison in one week after discharge.   CONSULTANTS:  Dr. Melrose Nakayama as well as Dr. Rexene Edison and Dr. Candace Cruise.  RADIOLOGIC STUDIES:  Ultrasound of abdomen, general survey, 12/30/2012, revealed common bile duct, which did not appear dilated on this study, a large mobile gallstone and gallbladder wall thickening was noted, but no positive sonographic Murphy's sign and the other  abdominal viscera exhibited no acute abnormalities. Abdominal aorta and inferior vena cava appeared normal as well. Postoperative cholangiogram 12/31/2012 revealed no junction of common bile duct and duodenum but there was evidence of contrast within the duodenum. The cystic duct remnant showed no definite filling defect. The left and right hepatic ducts, common hepatic duct as well as visualized common bile duct appeared to be normal.   HOSPITAL COURSE:  The patient is 49 year old Caucasian male who was recently diagnosed with MS, presents to the hospital 12/29/2012, with complaints of pain all over his body.  Apparently he was concerned that his pain was typical for his MS and failed outpatient to p.o. prednisone therapy. He was admitted to the hospital with diagnosis of multiple sclerosis. He was consulted by neurologist, Dr. Melrose Nakayama, who recommended to continue high doses of steroid therapy; however, we felt that the multiple symptomatology the patient was having could be seen by cholecystitis. The patient was consulted by Dr. Candace Cruise as well as [place] and proceeded to  cholecystectomy on 12/30/2012. Post cholecystectomy he did satisfactory. He was able to advance his diet to soft solid diet and since he did not have any more significant pains or discomfort or nausea or vomiting, he is being discharged home. He received at least 3 days therapy of 1 gram of Solu-Medrol a day for suspected MS exacerbation. No prednisone taper was recommended by Dr. Melrose Nakayama. The patient is to follow up with Dr. Melrose Nakayama in regards to his new diagnosis of MS and possibly will be referred to [facility] for further recommendations. In regards to cholecystitis, as mentioned above, the patient underwent cholecystectomy and postoperatively, he did (Dictation Anomaly) relatively well. He is to follow up with Dr. Rexene Edison in the next one week after discharge. The patient was complaining of some constipation signs on the day of discharge, 01/01/2013. He is going to be given some medications to help him with constipation. In the hospital, he was given a rectal suppository after which he had a small bowel movement. In regards to leukocytosis, the patient's leukocytosis was noted to be quite significant; however, that was felt to be due to steroids. It  was recommended to follow the patient's white blood cell count as outpatient and make sure he is improving. Of note,  patient's liver function tests were abnormal on 12/31/2012. It is recommended to follow the patient's LFTs after this procedure in approximately one week after discharge. The patient is  being discharged in stable condition with the above-mentioned medications and followup.   HIS VITAL SIGNS ON THE DAY OF DISCHARGE:  Temperature was 98.1. Pulse was 60s to 70s. Respiratory rate was 18, blood pressure 111/69, saturation was 95% to 96% on 2 L of oxygen through nasal cannula at rest and it was as high as 98 on room air at rest.   TIME SPENT: 40 minutes.  ____________________________ Theodoro Grist, MD rv:jm D: 01/01/2013 16:32:08 ET T: 01/01/2013 17:14:10 ET JOB#: [id number]  cc: Theodoro Grist, MD, <Dictator> [facility]. Melrose Nakayama, MD [place] Rexene Edison, MD [place] MD ELECTRONICALLY SIGNED 01/17/2013 11:07

## 2014-08-04 NOTE — Consult Note (Signed)
Chief Complaint:  Subjective/Chief Complaint Still with RUQ pain though less than yest. Pt to have GB surgery later this AM.   VITAL SIGNS/ANCILLARY NOTES: **Vital Signs.:   19-Sep-14 04:00  Vital Signs Type Routine  Temperature Temperature (F) 97.7  Celsius 36.5  Temperature Source oral  Pulse Pulse 59  Respirations Respirations 20  Systolic BP Systolic BP 681  Diastolic BP (mmHg) Diastolic BP (mmHg) 73  Mean BP 89  Pulse Ox % Pulse Ox % 99  Pulse Ox Activity Level  At rest  Oxygen Delivery 2L   Brief Assessment:  GEN no acute distress   Cardiac Regular   Respiratory clear BS   Gastrointestinal mild RUQ tenderness   Lab Results: Routine Hem:  19-Sep-14 05:42   WBC (CBC)  19.5  RBC (CBC)  4.31  Hemoglobin (CBC)  12.7  Hematocrit (CBC)  37.8  Platelet Count (CBC) 274  MCV 88  MCH 29.5  MCHC 33.6  RDW 13.6  Neutrophil % 91.6  Lymphocyte % 5.4  Monocyte % 2.9  Eosinophil % 0.0  Basophil % 0.1  Neutrophil #  17.8  Lymphocyte # 1.0  Monocyte # 0.6  Eosinophil # 0.0  Basophil # 0.0 (Result(s) reported on 31 Dec 2012 at 07:22AM.)   Assessment/Plan:  Assessment/Plan:  Assessment Cholecystitis. Gallstone.   Plan Gor GB surgery today.  Will sign off. Call GI if there are issues that arise.   Electronic Signatures: Verdie Shire (MD)  (Signed 19-Sep-14 08:12)  Authored: Chief Complaint, VITAL SIGNS/ANCILLARY NOTES, Brief Assessment, Lab Results, Assessment/Plan   Last Updated: 19-Sep-14 08:12 by Verdie Shire (MD)

## 2014-08-04 NOTE — Consult Note (Signed)
PATIENT NAME:  [name] [name], [name] [name] MR#:  [id number] DATE OF BIRTH:  February 22, 1966  DATE OF CONSULTATION:  12/30/2012  CONSULTING PHYSICIAN:  Harrell Gave A. Arionna Hoggard, MD  REASON FOR CONSULTATION: Right upper quadrant epigastric pain.   HISTORY OF PRESENT ILLNESS: [name] [name] is a pleasant 49 year old with history of recently diagnosed multiple sclerosis. He presents for the second time in the last 2 weeks with pain all over the body. He says that he presented initially with pain and was treated with IV steroids and felt better at that time. However, he finished his prednisone and his symptoms are back. He says the pain is mostly epigastric and right upper quadrant. He also has pain which goes to his back as well as into his jaw. He had a CT which showed some dilated common bile duct at that time. Currently he is without pain, but says that he has had about 3 episodes of intermittent right upper quadrant and epigastric pain while here. He had elevated LFTs, elevated white blood cell count. He had an ultrasound which showed thickened gallbladder wall and large gallstone.   PAST MEDICAL HISTORY: Multiple sclerosis.   PAST SURGICAL HISTORY: History of inguinal hernia repair.   ALLERGIES TO MEDICATIONS: PENICILLIN.   HOME MEDICINES: Currently none.   SOCIAL HISTORY: Lives with his wife. No history of tobacco or alcohol use. Currently not working because of [place].   FAMILY HISTORY: Denies any significant family history.   REVIEW OF SYSTEMS: A 12-point review of systems was obtained. Pertinent positives and negatives as above.   PHYSICAL EXAMINATION: VITAL SIGNS: Temperature 98.4, pulse 74, blood pressure 96/62, respirations 20, 98% on room air.  GENERAL: No acute distress. Alert and oriented x 3.  HEAD: Normocephalic, atraumatic.  FACE: No obvious facial trauma. Normal external nose. Normal external ears.  CHEST: Lungs clear to auscultation, moving air well.  HEART: Regular rate and rhythm. No murmurs,  rubs or gallops.  ABDOMEN: Soft, nontender, acutely distended.  EXTREMITIES: Moves all extremities well. Strength 5 out of 5.  NEUROLOGIC: Cranial nerves II through XII grossly intact. Sensation intact in all 4 extremities.   LABORATORY AND RADIOLOGICAL DATA: Labs are significant currently for a white cell count of 14.3; hemoglobin, hematocrit, platelets are all normal. Bilirubin is 0.5; AST is 124; ALT is 189. BMP is otherwise normal. Ultrasound shows gallbladder wall thickening to 6 cm, a distended gallbladder with 2 cm mobile shadowing stone.  IMPRESSION AND PLAN:  I feel that his pain can be consistent with cholecystitis and feel that he would be improved with cholecystectomy. He would like to proceed ahead with cholecystectomy. Will schedule him for operation. I have explained to him the potential benefits and complications associated with surgery and he would like to proceed.   ____________________________ [name] Norfolk Aragorn Recker, MD cal:jm D: 12/30/2012 17:19:08 ET T: 12/30/2012 17:38:19 ET JOB#: [id number]  cc: Harrell Gave A. Cobie Leidner, MD, <Dictator> Floyde Parkins MD ELECTRONICALLY SIGNED 01/01/2013 19:20

## 2014-08-04 NOTE — Consult Note (Signed)
PATIENT NAME:  CARRY, ORTEZ MR#:  [id number] DATE OF BIRTH:  Aug 18, 1965  GASTROENTEROLOGY CONSULTATION  DATE OF ADMISSION:  12/29/2012 DATE OF CONSULTATION:  12/30/2012  CONSULTING PHYSICIAN: Hulen Luster, M.D.   REASON FOR REFERRAL: Right upper quadrant abdominal pain.   HISTORY OF PRESENT ILLNESS: The patient is a 49 year old white male who was recently diagnosed with multiple sclerosis and was treated with tapering doses of prednisone. The patient was recently in the hospital elsewhere for acute and severe back, chest and abdominal pain. They thought it was related to multiple sclerosis and he was given prednisone, which did help. He was discharged, but a day or two ago he was having recurrent symptoms, mostly with pain in the back and  spine, associated with some headaches as well. There were no fevers or chills. There was no nausea and vomiting. I was asked to evaluate the patient because the pain appears to be  more in the right upper quadrant area.   He did have a CT scan of the abdomen done in the ER last week which showed a slightly dilated common bile duct, with no other abnormalities. The gallbladder itself looked fairly normal at the time. This morning he is feeling better with the pain medications. He complains more of the back pain rather than abdominal pain, although he still describes having some chest discomfort as well.   PAST MEDICAL HISTORY: Notable for a recent diagnosis of multiple sclerosis, based on a brain MRI and spinal fluid analysis.   PAST SURGICAL HISTORY: Inguinal hernia repair he was young.   ALLERGIES: HE IS ALLERGIC TO PENICILLIN.   He was on no home medications at home. He denies any smoking or alcohol use.   FAMILY HISTORY: Negative.   REVIEW OF SYSTEMS: Again, there are no fevers or chills, or fatigue or weakness. There are no visual or hearing changes. There is no coughing or shortness of breath. There may be a slight chest discomfort, but no palpitations.    GI symptoms have been described already. There is no diarrhea, constipation, or hematochezia and melena. The rest of the review of symptoms is negative.   PHYSICAL EXAMINATION: GENERAL: The patient appears to be in no acute distress.  VITAL SIGNS: He is afebrile. His vital signs were stable.  HEENT: Normocephalic, atraumatic head. Pupils are equally reactive. Throat was clear.  NECK: Supple.  CARDIAC: Regular rhythm and rate, without murmurs.  LUNGS: Clear bilaterally.  ABDOMEN: Showed slight tenderness in the right upper quadrant area. He was quite tender when he was initially admitted and evaluated. He has active bowel sounds. There is no hepatosplenomegaly.  EXTREMITIES: No clubbing, cyanosis, or edema.  SKIN: Negative.  NEUROLOGIC: Examination is nonfocal.   LABS: On admission, his liver enzymes were fairly normal. Electrolytes were normal.   CPK enzymes were normal, but his white count was elevated at 18.7, hemoglobin 13.6.   Today, this morning his white count was 14.3, hemoglobin was 12.9. Electrolytes are still normal. However, there is a change in his liver enzymes. This time, AST is 124 and ALT is 189.   ASSESSMENT AND PLAN: This is a patient with recurrent right upper quadrant pain. There is a suggestion on CT showing a mild intrahepatic duct dilation. At this time there is an elevation of AST and ALT, which is new for the patient. Upon further investigation his mother had gallbladder surgery for stones in the past.   I am concerned that he may have a gallbladder issue. Ultrasound  has been ordered for this morning. Will need to review this carefully to see whether are any signs of cholecystitis or not. If there are signs of cholecystitis or gallstones then the patient will need a surgical referral if the ultrasound is negative but the bile duct remains elevated. Also, the bilirubin is elevated or not he will need an ERCP instead. I discussed ERCP with the patient. I also  discussed potential risks. If ERCP is needed, then I would try to schedule for tomorrow.   Again, if ultrasound shows gallbladder disease then gallbladder surgery is indicated.   Thank you for the referral.    ____________________________ Lupita Dawn. Candace Cruise, MD pyo:dm D: 12/30/2012 10:57:44 ET T: 12/30/2012 11:28:35 ET JOB#: [id number]  cc: Lupita Dawn. Candace Cruise, MD, <Dictator> Lupita Dawn Violet Cart MD ELECTRONICALLY SIGNED 12/31/2012 9:41

## 2014-08-04 NOTE — Consult Note (Signed)
Referring Physician:  Gladstone Lighter :   Primary Care Physician:  Nonlocal MD, MD :   Reason for Consult: Admit Date: 29-Dec-2012  Chief Complaint: Pain through the spine, headache and right upper quadrant abdominal pain.  Reason for Consult: headache; back pain   History of Present Illness: History of Present Illness:   EUM:PNTI old man with new diagnosis multiple sclerosis about 2 months ago presents with headache pain, back pain and RUQ pain.  He was diagnosed with multiple sclerosis 2 months ago at [facility] neurology.  It is likely his M.S. had been going on for much longer than the past two months.  He reports abnormal neurologic events of weakness, numbness and tingling for the past 15-20 years.  He reports that he was thought to possibly be suffering from an aggressive type of demyelinating disease.  He was recently started on IV steroids which helped his symptoms.  However, a few days after finishing his oral steroid taper following the IV steroids, he felt his same "MS" symptoms of severe whole body pain had returned.  In addition he was noted to have RUQ pain.  [facility] abdomen that was apparently normal.  However, during the present hospitalization he has been found to have imaging study and test results consistent with cholecystitis and as such he is planning to have his gallbladder removed tomorrow.  He has been receiving pain medication and is being NPO this evening.  Given concern for possible M.S. recurrence in the setting of his cholecystitis, neurology is asked to assess. MEDICAL HISTORY: Multiple sclerosis.  SURGICAL HISTORY: Inguinal hernia repair. HISTORY: Feels both his parents are healthy and unaware of any other medical problems in them.   SOCIAL HISTORY: Lives at home with wife. alcohol or tobacco use.  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 MEDICATIONS: finished prednisone for steroid exacerbation, otherwise none.  ALLERGIES TO  MEDICATIONS:     ROS:  General denies complaints   HEENT no complaints   Lungs no complaints   Cardiac no complaints   GI nausea  pain   GU no complaints   Musculoskeletal no complaints   Extremities no complaints   Skin no complaints   Endocrine no complaints   Psych no complaints   Past Medical/Surgical Hx:  Multiple Sclerosis:   [facility] Current Meds:  Sodium Chloride 0.9%, 1000 ml at 75 ml/hr  Acetaminophen * tablet, ( Tylenol (325 mg) tablet)  650 mg Oral q4h PRN for pain or temp. greater than 100.4  - Indication: Pain/Fever  Acetaminophen-oxyCODONE 325/5 mg tablet, ( Tylox 5-325)  1 to 2 tablet(s) Oral q4h PRN for pain  - Indication: Pain  Instructions:  [Med Admin Window: 30 mins before or after scheduled dose]  MorphINE  injection, 2 to 4 mg, IV push, q4h PRN for pain  Indication: Pain, [Med Admin Window: 30 mins before or after scheduled dose]  Ondansetron injection, ( Zofran injection )  4 mg, IV push, q4h PRN for Nausea/Vomiting  Indication: Nausea/ Vomiting  methylPREDNISolone injection,  ( SoluMEDROL injection )  1000 mg in Dextrose 5% 250 ml, IV Piggyback, daily, Infuse over 30 minute(s)  Indication: Anti-inflammatory Agent/ Immunosuppressant, DO NOT REFRIGERATE  Pantoprazole injection,  ( Protonix injection )  40 mg, IV push, q12h  Indication: Erosive Esophagitis/ GERD, 1. Reconstitute Protonix with 66m of Normal Saline; concentration= 415mmL. Once reconstituted; Protonix must be administered within 30 minutes.  2. I.V. line must be flushed before and  after administration. Administer 18m (438m I.V. push over 2-3 minutes.  Nursing Saline Flush, 3 to 6 ml, IV push, Q1M PRN for IV Maintenance  Ciprofloxacin injection, ( Cipro injection )  400 mg, IV Piggyback, q12h, Infuse over 60 minute(s)  Indication: Infection  metroNIDAZOLE injection, ( Flagyl injection )  500 mg /100 ml (premix), IV Piggyback, q8h, Infuse over 60 minute(s)  Indication:  Infection  Allergies:  Penicillin: Hives  Vital Signs: **Vital Signs.:   18-Sep-14 04:46  Vital Signs Type Routine  Temperature Temperature (F) 97.9  Celsius 36.6  Pulse Pulse 71  Respirations Respirations 18  Systolic BP Systolic BP 11564Diastolic BP (mmHg) Diastolic BP (mmHg) 72  Mean BP 85  Pulse Ox % Pulse Ox % 99  Pulse Ox Activity Level  At rest  Oxygen Delivery 2L   EXAM: PHYSICAL EXAMINATION  GENERAL: Pleasant.  NAD.  Normocephalic and atraumatic.  EYES: Funduscopic exam shows normal disc size, appearance and C/D ratio without clear evidence of papilledema.  CARDIOVASCULAR: S1 and S2 sounds are within normal limits, without murmurs, gallops, or rubs.  MUSCULOSKELETAL: Bulk - Normal Tone - Normal Pronator Drift - Absent bilaterally. Ambulation - On bedrest due to cholecystitis and possible M.S. exacerbation, patient declines ambulation testing.  R/L 5/5    Shoulder abduction (deltoid/supraspinatus, axillary/suprascapular n, C5) 5/5    Elbow flexion (biceps brachii, musculoskeletal n, C5-6) 5/5    Elbow extension (triceps, radial n, C7) 5/5    Finger adduction (interossei, ulnar n, T1)   5/5    Hip flexion (iliopsoas, L1/L2) 5/5    Knee flexion (hamstrings, sciatic n, L5/S1) 5/5    Knee extension (quadriceps, femoral n, L3/4) 5/5    Ankle dorsiflexion (tibialis anterior, deep fibular n, L4/5) 5/5    Ankle plantarflexion (gastroc, tibial n, S1)  NEUROLOGICAL: MENTAL STATUS: Patient is oriented to person, place and time.  Recent and remote memory are intact.  Attention span and concentration are intact.  Naming, repetition, comprehension and expressive speech are within normal limits.  Patient's fund of knowledge is within normal limits for educational level.  CRANIAL NERVES: Normal    CN II (normal visual acuity and visual fields) Normal    CN III, IV, VI (extraocular muscles are intact) Normal    CN V (facial sensation is intact bilaterally) Normal     CN VII (facial strength is intact bilaterally) Normal    CN VIII (hearing is intact bilaterally) Normal    CN IX/X (palate elevates midline, normal phonation) Normal    CN XI (shoulder shrug strength is normal and symmetric) Normal    CN XII (tongue protrudes midline)   SENSATION: Intact to pain and temp bilaterally (spinothalamic tracts) Intact to position and vibration bilaterally (dorsal columns)   REFLEXES: R/L 3+/3+    Biceps 3+/3+    Brachioradialis   3+/3+    Patellar 3+/3+    Achilles Positive Hoffman's sign bilaterally.  COORDINATION/CEREBELLAR: Finger to nose testing is within normal limits.  Lab Results:  Hepatic:  17-Sep-14 15:32   Bilirubin, Direct < 0.1 (Result(s) reported on 29 Dec 2012 at 04:52PM.)  18-Sep-14 05:00   Bilirubin, Total 0.5  Alkaline Phosphatase 76  SGPT (ALT)  189  SGOT (AST)  124  Total Protein, Serum 6.6  Albumin, Serum  2.8  Routine Chem:  18-Sep-14 05:00   Glucose, Serum  158  BUN 15  Creatinine (comp) 0.78  Sodium, Serum 138  Potassium, Serum 4.6  Chloride, Serum 107  CO2, Serum  26  Calcium (Total), Serum 8.9  Osmolality (calc) 280  eGFR (African American) >60  eGFR (Non-African American) >60 (eGFR values <48m/min/1.73 m2 may be an indication of chronic kidney disease (CKD). Calculated eGFR is useful in patients with stable renal function. The eGFR calculation will not be reliable in acutely ill patients when serum creatinine is changing rapidly. It is not useful in  patients on dialysis. The eGFR calculation may not be applicable to patients at the low and high extremes of body sizes, pregnant women, and vegetarians.)  Anion Gap  5  Routine UA:  17-Sep-14 15:32   Color (UA) Colorless  Clarity (UA) Clear  Glucose (UA) Negative  Bilirubin (UA) Negative  Ketones (UA) Negative  Specific Gravity (UA) 1.010  Blood (UA) Negative  pH (UA) 7.0  Protein (UA) Negative  Nitrite (UA) Negative  Leukocyte Esterase (UA) Negative  (Result(s) reported on 29 Dec 2012 at 04:02PM.)  RBC (UA) <1 /HPF  WBC (UA) 1 /HPF  Bacteria (UA) TRACE  Epithelial Cells (UA) NONE SEEN  Mucous (UA) PRESENT  Hyaline Cast (UA) 2 /LPF (Result(s) reported on 29 Dec 2012 at 04:02PM.)  Routine Hem:  18-Sep-14 05:00   WBC (CBC)  14.3  RBC (CBC)  4.33  Hemoglobin (CBC)  12.9  Hematocrit (CBC)  38.2  Platelet Count (CBC) 252  MCV 88  MCH 29.9  MCHC 33.8  RDW 13.0  Neutrophil % 93.7  Lymphocyte % 5.3  Monocyte % 1.0  Eosinophil % 0.0  Basophil % 0.0  Neutrophil #  13.4  Lymphocyte #  0.8  Monocyte #  0.1  Eosinophil # 0.0  Basophil # 0.0 (Result(s) reported on 30 Dec 2012 at 06:32AM.)   Impression/Recommendations: Recommendations:   49year old man with new diagnosis multiple sclerosis about 2 months ago presents with headache pain, back pain and RUQ pain. although it is possible that this patient's recurrence of similar symptoms after coming his steroid course may represent another M.S. flare or worsening of his prior event, it appears that much of his current symptomotology can be explained by his cholecystitis.  I do not have access to any brain scans.  [facility] as well as any CSF to confirm diagnosis.  Depending on severity of lesions seen on MRI, the patient reports they were very severe, possibly representing an aggressive M.S. variant, he may require a second course of solumedrol 500 mg IV bid for 3 days.  In addition, the patient's wife reports that he had a CSF JC virus quant performed which showed low levels of JC virus in the CSF indicating that for his possibly aggressive type of M.S., [facility] was considering starting him on a course of tysabri.  Of course, this is a big decision which cannot be taken lightly.  Importantly, we must first confirm diagnosis and see workup results, and then we will be able to determine the best way forward.  Repeat MRI w and wo contrast can be  performed here to determine if there is enhancement to suggest active demyelinating lesions that would benefit from another course of IV steroids.  Discussed with patient that I can try to refer him to [facility] doctor Dr. LSherre Lainonce patient is discharged and seen in clinic and his gallbladder has been removed to seek the second opinion that he has requested.   have reviewed the results of the most recent tests including the ultrasound showing a large gallstone and irritated gallbladder wall  along with all labs as outlined above and answered all related questions.     Electronic Signatures: Anabel Bene (MD)  (Signed 19-Sep-14 00:35)  Authored: REFERRING PHYSICIAN, Primary Care Physician, Consult, History of Present Illness, Review of Systems, PAST MEDICAL/SURGICAL HISTORY, HOME MEDICATIONS, Current Medications, ALLERGIES, NURSING VITAL SIGNS, Physical Exam-, LAB RESULTS, Recommendations   Last Updated: 19-Sep-14 00:35 by Anabel Bene (MD)

## 2014-08-04 NOTE — Op Note (Signed)
PATIENT NAME:  [name] [name], FIGIEL MR#:  [id number] DATE OF BIRTH:  08/02/1965  DATE OF PROCEDURE:  12/31/2012  ATTENDING PHYSICIAN:  Dr. Marlyce Huge  PREOPERATIVE DIAGNOSIS:  Acute cholecystitis.   POSTOPERATIVE DIAGNOSIS:  Acute gangrenous cholecystitis.   PROCEDURE PERFORMED:  Laparoscopic cholecystectomy with cholangiogram.  ESTIMATED BLOOD LOSS:  15 mL.   COMPLICATIONS:  None.   SPECIMEN:  Gallbladder.   ANESTHESIA:  General.  INDICATION FOR SURGERY:  Mr. Grupe is a pleasant, 49 year old male who presents with right upper quadrant pain and thickened gallbladder. On ultrasound, he had symptoms consistent with cholecystitis, and thus was brought to the Operating Room for laparoscopic cholecystectomy.   DETAILS OF PROCEDURE AS FOLLOWS: Informed consent was obtained. Mr. Taves was brought to the Operating Room suite. He was laid supine on the operating table. He was induced. Endotracheal tube was placed. General anesthesia was administered. His abdomen was then prepped and draped in standard surgical fashion. A timeout was then performed, correctly identifying the patient name, operative site and procedure to be performed. A supraumbilical incision was made. This was deepened down to fascia. The fascia was incised. The peritoneum was entered. Two stay sutures were placed through the fascia adjacent to the fasciotomy. A Hassan trocar was placed in the abdomen. The abdomen was insufflated. An 11 mm epigastric trocar and two 5 mm right subcostal trocars were placed at the midclavicular and anterior axillary lines. The gallbladder appeared to be acutely inflamed. It had a couple of areas of what looked like full-thickness necrosis. The gallbladder was then retracted over the dome of the liver using a retractor placed through the lateral trocar. The adhesions were taken off the gallbladder. The cystic artery and cystic duct were cleared out. The critical view was obtained. A single clip was  then placed proximally on the cystic duct. The duct was then incised partially. A cholangiogram catheter was then placed through an Angiocath through the abdominal wall into the ductotomy, and a cholangiogram was obtained. A cholangiogram was performed that showed no obvious filling defects. It showed normal fill proximally into the intrahepatic bile ducts, and distally, contrast flowed easily into the duodenum. The catheter was then removed. The cystic duct was then ligated, clipped twice on the down side and ligated. The cystic artery was then clipped 3 times. The gallbladder was then taken off the gallbladder fossa and brought out through an Endo Catch bag. There was a large stone noted, and what appeared to be purulence coming from the gallbladder. The gallbladder fossa was then examined and made hemostatic. The abdomen was then irrigated with 3 liters of normal saline. The fossa was examined and noted to be hemostatic. The trocars were then removed under direct visualization. The supraumbilical port was closed by tying the previously-placed stay stitches. All wounds were then closed with 3-0 deep dermal and 4-0 Monocryl interrupted sutures. Steri-Strips, Telfa gauze, and Tegaderm were then placed over the wound. The patient was then awoken, extubated and brought to the postanesthesia care unit. There were no immediate complications. Needle, sponge, and instrument counts were correct at the end of the procedure.    ____________________________ Glena Norfolk. Duru Reiger, MD cal:mr D: 01/01/2013 19:28:08 ET T: 01/01/2013 21:12:49 ET JOB#: [id number]  cc: Harrell Gave A. Remi Rester, MD, <Dictator> Floyde Parkins MD ELECTRONICALLY SIGNED 01/10/2013 11:30

## 2014-08-04 NOTE — Consult Note (Signed)
Pt seen and examined. Full consult to  follow. Pt with possible MS. Recurrent admission with back pain and some chest pain. CT done last week but showed mild bile duct dilation. Yesterday, LFT completely normal. Today, AST/ALT elevated. No prior GB hx but his mother had GB removed. Pt to have U/S this AM. U/S will be key to decide what needs to be done. If GB abnormal, then will need surgery. If GB normal but CBD dilated and if T.bili starts rising, then ERCP will be necessary tomorrow. Will follow. Thanks.  Electronic Signatures: Verdie Shire (MD)  (Signed on 18-Sep-14 07:53)  Authored  Last Updated: 18-Sep-14 07:53 by Verdie Shire (MD)

## 2014-09-13 ENCOUNTER — Ambulatory Visit (INDEPENDENT_AMBULATORY_CARE_PROVIDER_SITE_OTHER): Payer: BLUE CROSS/BLUE SHIELD | Admitting: Family Medicine

## 2014-09-13 VITALS — BP 112/70 | HR 70 | Wt 193.0 lb

## 2014-09-13 DIAGNOSIS — H04203 Unspecified epiphora, bilateral lacrimal glands: Secondary | ICD-10-CM

## 2014-09-13 DIAGNOSIS — Q828 Other specified congenital malformations of skin: Secondary | ICD-10-CM

## 2014-09-13 DIAGNOSIS — G35 Multiple sclerosis: Secondary | ICD-10-CM | POA: Diagnosis not present

## 2014-09-13 DIAGNOSIS — B079 Viral wart, unspecified: Secondary | ICD-10-CM

## 2014-09-13 NOTE — Progress Notes (Signed)
   Subjective:    Patient ID: [name] [name], male    DOB: 1966/01/15, 49 y.o.   MRN: [id number]  HPI He has several skin lesions he would like evaluated and possibly removed.  He also complains of difficulty with watery right eye. The left eye does not bother him. This sometimes interferes with his visual acuity. He does have underlying MS but has apparently had no visual changes.  Review of Systems     Objective:   Physical Exam Alert and in no distress. Several skin tags noted on the neck and in the axilla. He also has a wart present on the right lateral chest area.       Assessment & Plan:  Multiple sclerosis - Plan: Ambulatory referral to Ophthalmology  Watery eyes - Plan: Ambulatory referral to Ophthalmology  Accessory skin tags  Wart Recommend returning for wart removal. We also discussed the skin tags in the benign nature that they represent. I will probably take off a few of these since he is coming in for the wart removal. Also recommend he use Claritin to see if this will help with her watery eyes.

## 2014-09-13 NOTE — Patient Instructions (Signed)
Use either Claritin or Allegra regularly for the next couple weeks

## 2014-09-26 ENCOUNTER — Ambulatory Visit (INDEPENDENT_AMBULATORY_CARE_PROVIDER_SITE_OTHER): Payer: BLUE CROSS/BLUE SHIELD | Admitting: Family Medicine

## 2014-09-26 ENCOUNTER — Encounter: Payer: Self-pay | Admitting: Family Medicine

## 2014-09-26 ENCOUNTER — Other Ambulatory Visit: Payer: Self-pay | Admitting: Family Medicine

## 2014-09-26 DIAGNOSIS — B079 Viral wart, unspecified: Secondary | ICD-10-CM

## 2014-09-26 DIAGNOSIS — L918 Other hypertrophic disorders of the skin: Secondary | ICD-10-CM

## 2014-09-26 NOTE — Progress Notes (Signed)
   Subjective:    Patient ID: [name] [name], male    DOB: [birth year], 49 y.o.   MRN: [id number]  HPI He is here for excision of skin tags on his neck, right axilla and one on his back. He also has a questionable wart in the right axilla.   Review of Systems     Objective:   Physical Exam 1 skin tag from each side of the neck was removed without difficulty. He also had one on his back that was excised without difficulty. The 1 cm lesion in his right axilla was injected with Xylocaine and shave excision done. This was hyfrecated. It was sent for pathology.      Assessment & Plan:  Wart  Cutaneous skin tags

## 2014-10-23 ENCOUNTER — Encounter: Payer: Self-pay | Admitting: Family Medicine

## 2014-10-23 ENCOUNTER — Ambulatory Visit (INDEPENDENT_AMBULATORY_CARE_PROVIDER_SITE_OTHER): Payer: BLUE CROSS/BLUE SHIELD | Admitting: Family Medicine

## 2014-10-23 VITALS — BP 114/70 | HR 78 | Ht 70.0 in | Wt 190.0 lb

## 2014-10-23 DIAGNOSIS — G5791 Unspecified mononeuropathy of right lower limb: Secondary | ICD-10-CM

## 2014-10-23 DIAGNOSIS — R531 Weakness: Secondary | ICD-10-CM | POA: Diagnosis not present

## 2014-10-23 DIAGNOSIS — E291 Testicular hypofunction: Secondary | ICD-10-CM | POA: Diagnosis not present

## 2014-10-23 DIAGNOSIS — Z0000 Encounter for general adult medical examination without abnormal findings: Secondary | ICD-10-CM | POA: Diagnosis not present

## 2014-10-23 DIAGNOSIS — G35 Multiple sclerosis: Secondary | ICD-10-CM | POA: Diagnosis not present

## 2014-10-23 DIAGNOSIS — Z860100 Personal history of colon polyps, unspecified: Secondary | ICD-10-CM

## 2014-10-23 DIAGNOSIS — Z860101 Personal history of adenomatous and serrated colon polyps: Secondary | ICD-10-CM | POA: Insufficient documentation

## 2014-10-23 DIAGNOSIS — M792 Neuralgia and neuritis, unspecified: Secondary | ICD-10-CM

## 2014-10-23 LAB — COMPREHENSIVE METABOLIC PANEL
ALBUMIN: 4.2 g/dL (ref 3.5–5.2)
ALT: 13 U/L (ref 0–53)
AST: 14 U/L (ref 0–37)
Alkaline Phosphatase: 45 U/L (ref 39–117)
BUN: 17 mg/dL (ref 6–23)
CO2: 26 mEq/L (ref 19–32)
CREATININE: 0.75 mg/dL (ref 0.50–1.35)
Calcium: 9.2 mg/dL (ref 8.4–10.5)
Chloride: 106 mEq/L (ref 96–112)
Glucose, Bld: 94 mg/dL (ref 70–99)
Potassium: 4.3 mEq/L (ref 3.5–5.3)
Sodium: 142 mEq/L (ref 135–145)
Total Bilirubin: 0.4 mg/dL (ref 0.2–1.2)
Total Protein: 6.8 g/dL (ref 6.0–8.3)

## 2014-10-23 LAB — LIPID PANEL
Cholesterol: 213 mg/dL — ABNORMAL HIGH (ref 0–200)
HDL: 36 mg/dL — ABNORMAL LOW (ref >=40)
LDL Cholesterol: 143 mg/dL — ABNORMAL HIGH (ref 0–99)
Total CHOL/HDL Ratio: 5.9 Ratio
Triglycerides: 171 mg/dL — ABNORMAL HIGH (ref <150)
VLDL: 34 mg/dL (ref 0–40)

## 2014-10-23 NOTE — Progress Notes (Signed)
   Subjective:    Patient ID: [name] [name], male    DOB: [birth year], 49 y.o.   MRN: [id number]  HPI He is here for complete examination. He does have multiple sclerosis and is beating care of at [facility]. He gets regular injections and was doing fairly well on this. They also have him on Marinol which is apparently helping with a neuropathy probably secondary to the EMS. He does have underlying hypogonadism but has stopped taking testosterone stating he didn't think it did him any good. He has a history of colonic polyps and is scheduled for follow-up colonoscopy in the near future. He continues have difficulty with weakness secondary to the MS. Health maintenance, immunizations, social and family history were reviewed.   Review of Systems  All other systems reviewed and are negative.      Objective:   Physical Exam BP 114/70 mmHg  Pulse 78  Ht 5\' 10"  (1.778 m)  Wt 190 lb (86.183 kg)  BMI 27.26 kg/m2  SpO2 99%  General Appearance:    Alert, cooperative, no distress, appears stated age  Head:    Normocephalic, without obvious abnormality, atraumatic  Eyes:    PERRL, conjunctiva/corneas clear, EOM's intact, fundi    benign  Ears:    Normal TM's and external ear canals  Nose:   Nares normal, mucosa normal, no drainage or sinus   tenderness  Throat:   Lips, mucosa, and tongue normal; teeth and gums normal  Neck:   Supple, no lymphadenopathy;  thyroid:  no   enlargement/tenderness/nodules; no carotid   bruit or JVD  Back:    Spine nontender, no curvature, ROM normal, no CVA     tenderness  Lungs:     Clear to auscultation bilaterally without wheezes, rales or     ronchi; respirations unlabored  Chest Wall:    No tenderness or deformity   Heart:    Regular rate and rhythm, S1 and S2 normal, no murmur, rub   or gallop  Breast Exam:    No chest wall tenderness, masses or gynecomastia  Abdomen:     Soft, non-tender, nondistended, normoactive bowel sounds,    no masses, no hepatosplenomegaly         Extremities:   No clubbing, cyanosis or edema  Pulses:   2+ and symmetric all extremities  Skin:   Skin color, texture, turgor normal, no rashes or lesions  Lymph nodes:   Cervical, supraclavicular, and axillary nodes normal  Neurologic:   CNII-XII intact, normal strength, sensation and gait; reflexes 2+ and symmetric throughout          Psych:   Normal mood, affect, hygiene and grooming.          Assessment & Plan:  Routine general medical examination at a health care facility - Plan: CBC with Differential/Platelet, Comprehensive metabolic panel, Lipid panel  Multiple sclerosis - Plan: CBC with Differential/Platelet, Comprehensive metabolic panel  Hypogonadism male  Intractable neuropathic pain of right lower extremity  Weakness  History of colonic polyps Will continue on his present medication regimen. Follow-up here as needed.

## 2014-10-24 LAB — CBC WITH DIFFERENTIAL/PLATELET
Basophils Absolute: 0.1 10*3/uL (ref 0.0–0.1)
Basophils Relative: 1 % (ref 0–1)
EOS ABS: 0.3 10*3/uL (ref 0.0–0.7)
Eosinophils Relative: 4 % (ref 0–5)
HCT: 37.5 % — ABNORMAL LOW (ref 39.0–52.0)
Hemoglobin: 12.3 g/dL — ABNORMAL LOW (ref 13.0–17.0)
Lymphocytes Relative: 45 % (ref 12–46)
Lymphs Abs: 3.8 10*3/uL (ref 0.7–4.0)
MCH: 28.2 pg (ref 26.0–34.0)
MCHC: 32.8 g/dL (ref 30.0–36.0)
MCV: 86 fL (ref 78.0–100.0)
MPV: 10.5 fL (ref 8.6–12.4)
Monocytes Absolute: 0.9 10*3/uL (ref 0.1–1.0)
Monocytes Relative: 10 % (ref 3–12)
Neutro Abs: 3.4 10*3/uL (ref 1.7–7.7)
Neutrophils Relative %: 40 % — ABNORMAL LOW (ref 43–77)
Platelets: 270 10*3/uL (ref 150–400)
RBC: 4.36 MIL/uL (ref 4.22–5.81)
RDW: 14.6 % (ref 11.5–15.5)
WBC: 8.5 10*3/uL (ref 4.0–10.5)

## 2014-12-01 ENCOUNTER — Ambulatory Visit (INDEPENDENT_AMBULATORY_CARE_PROVIDER_SITE_OTHER): Payer: BLUE CROSS/BLUE SHIELD | Admitting: Family Medicine

## 2014-12-01 ENCOUNTER — Encounter: Payer: Self-pay | Admitting: Gastroenterology

## 2014-12-01 ENCOUNTER — Encounter: Payer: Self-pay | Admitting: Family Medicine

## 2014-12-01 VITALS — BP 120/70 | HR 70 | Wt 186.0 lb

## 2014-12-01 DIAGNOSIS — R197 Diarrhea, unspecified: Secondary | ICD-10-CM

## 2014-12-01 NOTE — Progress Notes (Signed)
   Subjective:    Patient ID: [name] [name], male    DOB: 10-13-1965, 49 y.o.   MRN: [id number]  HPI He complains of a several month history of difficulty with diarrhea. He states that when he has emerged he needs to be near a bathroom otherwise he will on occasion soiled his underwear. He has had diet changed in that he is trying to lose weight and is eating more vegetables but otherwise has had no change in his eating habits. He's had no recent antibiotics.He has had no travel or history of drinking well water. No fever, chills, blood or pus in his stool. He does have underlying MS and continues on therapy for this. He did have a colonoscopy done last year.   Review of Systems     Objective:   Physical Exam Alert and in no distress. Abdominal exam shows active bowel sounds without masses or tenderness.       Assessment & Plan:  Diarrhea - Plan: Ambulatory referral to Gastroenterology Difficult to say what is going on here and will therefore refer him to GI.

## 2015-01-29 LAB — HM COLONOSCOPY: HM Colonoscopy: NORMAL

## 2015-02-01 ENCOUNTER — Encounter: Payer: Self-pay | Admitting: Family Medicine

## 2015-02-05 ENCOUNTER — Encounter: Payer: Self-pay | Admitting: Gastroenterology

## 2015-02-05 ENCOUNTER — Other Ambulatory Visit: Payer: BLUE CROSS/BLUE SHIELD

## 2015-02-05 ENCOUNTER — Ambulatory Visit (INDEPENDENT_AMBULATORY_CARE_PROVIDER_SITE_OTHER): Payer: BLUE CROSS/BLUE SHIELD | Admitting: Gastroenterology

## 2015-02-05 VITALS — BP 120/70 | HR 80 | Ht 70.0 in | Wt 184.2 lb

## 2015-02-05 DIAGNOSIS — R1032 Left lower quadrant pain: Secondary | ICD-10-CM | POA: Diagnosis not present

## 2015-02-05 DIAGNOSIS — R1031 Right lower quadrant pain: Secondary | ICD-10-CM

## 2015-02-05 DIAGNOSIS — R197 Diarrhea, unspecified: Secondary | ICD-10-CM | POA: Diagnosis not present

## 2015-02-05 MED ORDER — GLYCOPYRROLATE 1 MG PO TABS: 1.0000 mg | ORAL_TABLET | Freq: Two times a day (BID) | ORAL | Status: DC

## 2015-02-05 NOTE — Progress Notes (Signed)
    History of Present Illness: This is a 49 year old male with a history of urgent diarrhea for the past few months. He notes frequent crampy lower abdominal pain that is relieved by urgent diarrhea. He has had a few episodes of incontinence. He states that he has had similar mild symptoms on rare occasions over the past 2 years but beginning about 3-4 months ago the symptoms have become an ongoing problem. He states he called our office for an appointment and was unable to obtain one in a timely manner so he sought evaluation by Dr. Cristi Loron [facility]. TFTs, IgA and tTG were normal. He underwent colonoscopy to the TI which was normal including normal normal random colon biopsies. Orders note a C. Difficile specimen sent at colonoscopy but i cannot locate a result. Relates a long history of lactose intolerance and generally but not always avoids most lactose products. Patient notes he changed his diet several months ago when he began to eat more fruits and vegetables and reduced meat and processed carbohydrate intake. Denies weight loss, change in stool caliber, melena, hematochezia, nausea, vomiting, dysphagia, reflux symptoms, chest pain.  Current Medications, Allergies, Past Medical History, Past Surgical History, Family History and Social History were reviewed in [employer] record.  Physical Exam: General: Well developed, well nourished, no acute distress Head: Normocephalic and atraumatic Eyes:  sclerae anicteric, EOMI Ears: Normal auditory acuity Mouth: No deformity or lesions Lungs: Clear throughout to auscultation Heart: Regular rate and rhythm; no murmurs, rubs or bruits Abdomen: Soft, non tender and non distended. No masses, hepatosplenomegaly or hernias noted. Normal Bowel sounds Musculoskeletal: Symmetrical with no gross deformities  Pulses:  Normal pulses noted Extremities: No clubbing, cyanosis, edema or deformities noted Neurological: Alert oriented x 4,  grossly nonfocal Psychological:  Alert and cooperative. Normal mood and affect  Assessment and Recommendations:  1. Diarrhea associated with lower abdominal pain and occasional fecal incontinence recently evaluated by Dr. Malissa Hippo at [facility] without an etiology determined. Obtain a GI pathogen panel and fecal elastase. I've asked the patient to strictly avoid all lactose products for the next 7-10 days. I've asked the patient to resume his prior diet for the next 7-10 days. Begin Align once daily. Begin glycopyrrolate 1 mg twice a day as needed for abdominal pain and Imodium twice a day as needed for diarrhea. Keep return appt with Dr. Malissa Hippo as planned. Return office visit in 4-6 weeks with me.

## 2015-02-05 NOTE — Patient Instructions (Signed)
Your physician has requested that you go to the basement for the following lab work before leaving today:GI pathogen panel, pancreatic fecal elastase.   We have sent the following medications to your pharmacy for you to pick up at your convenience:Robinul.   You can take over the counter Imodium as needed for diarrhea.   Take the probiotic Align over the counter once daily.   Your follow up appointment with Dr. Fuller Plan is on 03/16/15 at 9:45am.   Thank you for choosing me and [facility].  Pricilla Riffle. Dagoberto Ligas., MD., Marval Regal

## 2015-02-07 NOTE — Telephone Encounter (Signed)
Error

## 2015-02-16 ENCOUNTER — Other Ambulatory Visit: Payer: BLUE CROSS/BLUE SHIELD

## 2015-02-16 DIAGNOSIS — R1031 Right lower quadrant pain: Secondary | ICD-10-CM

## 2015-02-16 DIAGNOSIS — R1032 Left lower quadrant pain: Secondary | ICD-10-CM

## 2015-02-16 DIAGNOSIS — R197 Diarrhea, unspecified: Secondary | ICD-10-CM

## 2015-02-20 LAB — GASTROINTESTINAL PATHOGEN PANEL PCR
C. difficile Tox A/B, PCR: NEGATIVE
CAMPYLOBACTER, PCR: NEGATIVE
Cryptosporidium, PCR: NEGATIVE
E COLI (STEC) STX1/STX2, PCR: NEGATIVE
E COLI 0157, PCR: NEGATIVE
E coli (ETEC) LT/ST PCR: NEGATIVE
Giardia lamblia, PCR: NEGATIVE
Norovirus, PCR: NEGATIVE
Rotavirus A, PCR: NEGATIVE
SALMONELLA, PCR: NEGATIVE
SHIGELLA, PCR: NEGATIVE

## 2015-02-23 LAB — PANCREATIC ELASTASE, FECAL: Pancreatic Elastase-1, Stool: 485 mcg/g

## 2015-03-16 ENCOUNTER — Encounter: Payer: Self-pay | Admitting: Physician Assistant

## 2015-03-16 ENCOUNTER — Ambulatory Visit (INDEPENDENT_AMBULATORY_CARE_PROVIDER_SITE_OTHER): Payer: BLUE CROSS/BLUE SHIELD | Admitting: Physician Assistant

## 2015-03-16 ENCOUNTER — Other Ambulatory Visit (INDEPENDENT_AMBULATORY_CARE_PROVIDER_SITE_OTHER): Payer: BLUE CROSS/BLUE SHIELD

## 2015-03-16 VITALS — BP 104/68 | HR 80 | Ht 70.0 in | Wt 186.8 lb

## 2015-03-16 DIAGNOSIS — R1032 Left lower quadrant pain: Secondary | ICD-10-CM | POA: Diagnosis not present

## 2015-03-16 DIAGNOSIS — R197 Diarrhea, unspecified: Secondary | ICD-10-CM | POA: Diagnosis not present

## 2015-03-16 DIAGNOSIS — R1031 Right lower quadrant pain: Secondary | ICD-10-CM | POA: Diagnosis not present

## 2015-03-16 LAB — BASIC METABOLIC PANEL
BUN: 17 mg/dL (ref 6–23)
CHLORIDE: 107 meq/L (ref 96–112)
CO2: 27 meq/L (ref 19–32)
Calcium: 9.2 mg/dL (ref 8.4–10.5)
Creatinine, Ser: 0.79 mg/dL (ref 0.40–1.50)
GFR: 109.7 mL/min (ref >60.00)
Glucose, Bld: 101 mg/dL — ABNORMAL HIGH (ref 70–99)
POTASSIUM: 4.4 meq/L (ref 3.5–5.1)
Sodium: 141 mEq/L (ref 135–145)

## 2015-03-16 NOTE — Progress Notes (Signed)
Patient ID: Oxley Sarni, male   DOB: [birth year], 49 y.o.   MRN: [id number]   Subjective:    Patient ID: Jubal Eshbaugh, male    DOB: 31-Jan-1966, 49 y.o.   MRN: [id number]  HPI  Ward  Is a pleasant 49 year old white male recently known to Dr. Fuller Plan seen in October 2016 with 3-4 month history of diarrhea. He had also pursued evaluation at [facility] as he had difficulty getting in for an appointment here. He had undergone colonoscopy which was negative including random biopsies.  Celiac panel was done and is negative, GI pathogen panel negative and fecal elastase negative. Patient does have history of a component of lactose intolerance and has been avoiding lactose.  He had been placed on daily probiotic and Robinul Forte 2 mg by mouth twice a day as needed and/or Imodium twice a day as needed.  he says he is currently not having nearly as much diarrhea as he had been. He said few months back he was having horrible diarrhea and had a lot of abdominal cramping and discomfort at onset of his symptoms. Now he has lower abdominal discomfort which she says is still present fairly constantly and notices it particularly if he put something tight on his abdomen. He is not having diarrhea every day says once or twice a week he seems to have a bowel movement followed by loose stool and then evacuates his bowel. After that he may have a normal bowel movement or go back to a loose bowel movement. Appetite is been fine and weight has been stable. He has backed off on fiber on his diet which she feels is also helping  Review of Systems Pertinent positive and negative review of systems were noted in the above HPI section.  All other review of systems was otherwise negative.  Outpatient Encounter Prescriptions as of 03/16/2015  Medication Sig  . dronabinol (MARINOL) 5 MG capsule Take by mouth as needed.   Marland Kitchen glycopyrrolate (ROBINUL) 1 MG tablet Take 1 tablet (1 mg total) by mouth 2 (two) times daily.  .  natalizumab 300 mg in sodium chloride 0.9 % 100 mL Inject 300 mg into the vein every 28 (twenty-eight) days.    No facility-administered encounter medications on file as of 03/16/2015.   Allergies  Allergen Reactions  . Penicillins Rash   Patient Active Problem List   Diagnosis Date Noted  . History of colonic polyps 10/23/2014  . Abnormal finding on MRI of brain 11/09/2012  . Weakness 11/09/2012  . Gait difficulty 11/09/2012  . Multiple sclerosis ([place]) 11/03/2012  . Hypogonadism male 10/06/2011   Social History   Social History  . Marital Status: Married    Spouse Name: N/A  . Number of Children: 0  . Years of Education: N/A   Occupational History  . retired/disabilty    Social History Main Topics  . Smoking status: Former Smoker    Types: Cigarettes    Quit date: 07/14/2010  . Smokeless tobacco: Never Used  . Alcohol Use: No  . Drug Use: No  . Sexual Activity: Yes   Other Topics Concern  . Not on file   Social History Narrative    Mr. Bellmore's family history includes Arthritis in his mother. There is no history of Colon cancer, Esophageal cancer, Rectal cancer, or Stomach cancer.      Objective:    Filed Vitals:   03/16/15 0941  BP: 104/68  Pulse: 80    Physical Exam    Well-developed  white male in no acute distress, pleasant blood pressure 104/68 pulse 80 height 5 foot 10 weight 186. HEENT; nontraumatic, cephalic EOMI PERRLA sclera anicteric, Cardiovascular ;regular rate and rhythm with S1-S2 no murmur or gallop, Pulmonary, clear bilaterally, Abdomen; soft ,. Some mild tenderness in the left lower left mid quadrant there is no guarding or rebound no palpable mass or hepatosplenomegaly bowel sounds present, Rectal, exam not done, Ext; no clubbing cyanosis or edema skin warm and dry, Neuropsych ,mood and affect appropriate       Assessment & Plan:   #1 50 yo WM with onset diarrhea and lower abdominal  pain 4-5 months ago- work up thus far negative-  symptoms have improved but not resolved- still lower abdominal discomfort and intermittent bouts of diarrhea Wonder if had an infectious enterocolitis and now with post infectious IBS Doubt IBD- but cannot r/o small bowel disease #2 MS- on Biologic  Plan; Ct abd/pelvis with contrast  Continue daily probiotic Prn Robinul forte  Low lactose diet  further plans and follow up pending Ct   Amy Genia Harold PA-C 03/16/2015   Cc: Denita Lung, MD

## 2015-03-16 NOTE — Progress Notes (Signed)
Reviewed and agree with management plan.  Malcolm T. Stark, MD FACG 

## 2015-03-16 NOTE — Patient Instructions (Addendum)
Please go to the basement level to have your labs drawn.  Continue a daily probiotic.  Take Robinul Forte 2 mg as needed for diarrhea.   You have been scheduled for a CT scan of the abdomen and pelvis at [facility] ([address]---this is in the same building as Press [facility]).   You are scheduled on Thursday 03-22-2015 at 2:00 PM . You should arrive at 1:45 PM  to your appointment time for registration. Please follow the written instructions below on the day of your exam:  WARNING: IF YOU ARE ALLERGIC TO IODINE/X-RAY DYE, PLEASE NOTIFY RADIOLOGY IMMEDIATELY AT [phone]! YOU WILL BE GIVEN A 13 HOUR PREMEDICATION PREP.  1) Do not eat or drink anything after 10:00 am  (4 hours prior to your test) 2) You have been given 2 bottles of oral contrast to drink. The solution may taste better if refrigerated, but do NOT add ice or any other liquid to this solution. Shake well before drinking.    Drink 1 bottle of contrast @ 12:00 Noon (2 hours prior to your exam)  Drink 1 bottle of contrast @ 1:00 PM  (1 hour prior to your exam)  You may take any medications as prescribed with a small amount of water except for the following: Metformin, Glucophage, Glucovance, Avandamet, Riomet, Fortamet, Actoplus Met, Janumet, Glumetza or Metaglip. The above medications must be held the day of the exam AND 48 hours after the exam.  The purpose of you drinking the oral contrast is to aid in the visualization of your intestinal tract. The contrast solution may cause some diarrhea. Before your exam is started, you will be given a small amount of fluid to drink. Depending on your individual set of symptoms, you may also receive an intravenous injection of x-ray contrast/dye. Plan on being at [facility] for 30 minutes or long, depending on the type of exam you are having performed.  If you have any questions regarding your exam or if you need to reschedule, you may call the CT department at  [phone] between the hours of 8:00 am and 5:00 pm, Monday-Friday.  ________________________________________________________________________

## 2015-03-22 ENCOUNTER — Ambulatory Visit (HOSPITAL_COMMUNITY): Payer: BLUE CROSS/BLUE SHIELD

## 2015-03-23 ENCOUNTER — Encounter (HOSPITAL_COMMUNITY): Payer: Self-pay

## 2015-03-23 ENCOUNTER — Ambulatory Visit (HOSPITAL_COMMUNITY)
Admission: RE | Admit: 2015-03-23 | Discharge: 2015-03-23 | Disposition: A | Discharge status: Home / Self Care | Payer: BLUE CROSS/BLUE SHIELD | Source: Ambulatory Visit | Attending: Physician Assistant | Admitting: Physician Assistant

## 2015-03-23 DIAGNOSIS — R1032 Left lower quadrant pain: Secondary | ICD-10-CM

## 2015-03-23 DIAGNOSIS — R197 Diarrhea, unspecified: Secondary | ICD-10-CM

## 2015-03-23 DIAGNOSIS — R10819 Abdominal tenderness, unspecified site: Secondary | ICD-10-CM | POA: Insufficient documentation

## 2015-03-23 DIAGNOSIS — R1031 Right lower quadrant pain: Secondary | ICD-10-CM

## 2015-03-23 DIAGNOSIS — I700 Atherosclerosis of aorta: Secondary | ICD-10-CM | POA: Diagnosis not present

## 2015-03-23 DIAGNOSIS — R1030 Lower abdominal pain, unspecified: Secondary | ICD-10-CM | POA: Insufficient documentation

## 2015-03-23 MED ORDER — IOHEXOL 300 MG/ML  SOLN
100.0000 mL | Freq: Once | INTRAMUSCULAR | Status: AC | PRN
  Administered 2015-03-23: 100 mL via INTRAVENOUS

## 2015-03-27 ENCOUNTER — Telehealth: Payer: Self-pay | Admitting: Gastroenterology

## 2015-03-27 NOTE — Telephone Encounter (Signed)
Advised the results are WNL's. No cause of pain found.

## 2015-04-05 ENCOUNTER — Telehealth: Payer: Self-pay | Admitting: Gastroenterology

## 2015-04-05 NOTE — Telephone Encounter (Signed)
CT ordered by AE on 12/9 was negative If still having diarrhea try a low FODMAP diet for 10 days Continue probiotic daily and Robinul bid

## 2015-04-05 NOTE — Telephone Encounter (Signed)
Pt calling for CT results. Please advise. 

## 2015-04-06 NOTE — Telephone Encounter (Signed)
Pt aware.

## 2015-04-06 NOTE — Telephone Encounter (Signed)
Left message for pt to call back  °

## 2015-04-24 DIAGNOSIS — G35 Multiple sclerosis: Secondary | ICD-10-CM | POA: Diagnosis not present

## 2015-05-15 ENCOUNTER — Telehealth: Payer: Self-pay | Admitting: Family Medicine

## 2015-05-15 NOTE — Telephone Encounter (Signed)
Called pt @ flu shot & he state he never gets the flu shot

## 2015-05-21 ENCOUNTER — Telehealth: Payer: Self-pay | Admitting: Family Medicine

## 2015-05-21 NOTE — Telephone Encounter (Signed)
Pt came in and dropped off handicapped placard form. Sending back from completion. Please call pt at [phone] when ready.

## 2015-05-22 DIAGNOSIS — G35 Multiple sclerosis: Secondary | ICD-10-CM | POA: Diagnosis not present

## 2015-06-19 DIAGNOSIS — G35 Multiple sclerosis: Secondary | ICD-10-CM | POA: Diagnosis not present

## 2015-06-26 ENCOUNTER — Encounter: Payer: Self-pay | Admitting: Family Medicine

## 2015-06-26 ENCOUNTER — Ambulatory Visit (INDEPENDENT_AMBULATORY_CARE_PROVIDER_SITE_OTHER): Payer: Medicare Other | Admitting: Family Medicine

## 2015-06-26 VITALS — BP 110/70 | HR 87 | Ht 69.0 in | Wt 186.0 lb

## 2015-06-26 DIAGNOSIS — G35 Multiple sclerosis: Secondary | ICD-10-CM

## 2015-06-26 DIAGNOSIS — E291 Testicular hypofunction: Secondary | ICD-10-CM | POA: Diagnosis not present

## 2015-06-26 DIAGNOSIS — I700 Atherosclerosis of aorta: Secondary | ICD-10-CM

## 2015-06-26 NOTE — Progress Notes (Signed)
[name] [name] is a 50 y.o. male who presents for annual wellness visit and follow-up on chronic medical conditions.  He has the following concerns:he continues to be followed at [facility] for his underlying MS which is relapsing remitting variety. He does have an underlying history of hypogonadism but is not interested in further studies concerning this. He also is followed by Dr. Fuller Plan for GI issues and still having some difficulties with that. A recentCT did show evidence of atherosclerosis.   Immunizations and Health Maintenance Immunization History  Administered Date(s) Administered  . Tdap 04/24/2010   Health Maintenance Due  Topic Date Due  . Hepatitis C Screening  1966/02/23  . HIV Screening  09/22/1978    Last colonoscopy:102016 Last PSA: Dentist:? Ophtho:[place] Exercise:minimal  Other doctors caring for patient include:Dr. Delman Cheadle.Stark,Skeen ([facility])  Advanced Directives: Does patient have an advance directive?: Yes Type of Advance Directive: Healthcare Power of Attorney, Living will Copy of advanced directive(s) in chart?: No - copy requested  Depression screen:  See questionnaire below.     Depression screen [facility] 2/9 06/26/2015 10/21/2013  Decreased Interest 0 1  Down, Depressed, Hopeless 0 1  PHQ - 2 Score 0 2    Fall Screen: See Questionaire below.   Fall Risk  06/26/2015  Falls in the past year? No    ADL screen:  See questionnaire below.  Functional Status Survey: Is the patient deaf or have difficulty hearing?: No Does the patient have difficulty seeing, even when wearing glasses/contacts?: No Does the patient have difficulty concentrating, remembering, or making decisions?: Yes (Goes with the MS) Does the patient have difficulty walking or climbing stairs?: Yes Does the patient have difficulty dressing or bathing?: No Does the patient have difficulty doing errands alone such as visiting a doctor's office or shopping?: No   Review of  Systems  Constitutional: -, -unexpected weight change, -anorexia, -fatigue Allergy: -sneezing, -itching, -congestion Dermatology: denies changing moles, rash, lumps ENT: -runny nose, -ear pain, -sore throat,  Cardiology:  -chest pain, -palpitations, -orthopnea, Respiratory: -cough, -shortness of breath, -dyspnea on exertion, -wheezing,  Gastroenterology: -abdominal pain, -nausea, -vomiting, -diarrhea, -constipation, -dysphagia Hematology: -bleeding or bruising problems Musculoskeletal: -arthralgias, -myalgias, -joint swelling, -back pain, - Ophthalmology: -vision changes,  Urology: -dysuria, -difficulty urinating,  -urinary frequency, -urgency, incontinence Neurology: -, -numbness, , -memory loss, -falls, -dizziness    PHYSICAL EXAM:  BP 110/70 mmHg  Pulse 87  Ht 5\' 9"  (1.753 m)  Wt 186 lb (84.369 kg)  BMI 27.45 kg/m2  SpO2 98%  ASSESSMENT/PLAN: Atherosclerosis of abdominal aorta (HCC)  Multiple sclerosis (HCC)  Hypogonadism male     Discussed PSA screening (risks/benefits), recommended at least 30 minutes of aerobic activity at least 5 days/week; proper sunscreen use reviewed; healthy diet and alcohol recommendations (less than or equal to 2 drinks/day) reviewed; regular seatbelt use; changing batteries in smoke detectors. Immunization recommendations discussed.   Medicare Attestation I have personally reviewed: The patient's medical and social history Their use of alcohol, tobacco or illicit drugs Their current medications and supplements The patient's functional ability including ADLs,fall risks, home safety risks, cognitive, and hearing and visual impairment Diet and physical activities Evidence for depression or mood disorders  The patient's weight, height, and BMI have been recorded in the chart.  I have made referrals, counseling, and provided education to the patient based on review of the above and I have provided the patient with a written personalized care  plan for preventive services.     Wyatt Haste, MD  06/26/2015      

## 2015-06-26 NOTE — Patient Instructions (Signed)
  Mr. [name] [name] , Thank you for taking time to come for your Medicare Wellness Visit. I appreciate your ongoing commitment to your health goals. Please review the following plan we discussed and let me know if I can assist you in the future.   These are the goals we discussed: Continue follow-up at [facility]  This is a list of the screening recommended for you and due dates:  Health Maintenance  Topic Date Due  .  Hepatitis C: One time screening is recommended by Center for Disease Control  (CDC) for  adults born from [birth year] through 1967.   09-15-1965  . HIV Screening  09/22/1978  . Flu Shot  07/13/2015*  . Colon Cancer Screening  01/28/2017  . Tetanus Vaccine  04/24/2020  *Topic was postponed. The date shown is not the original due date.

## 2015-06-26 NOTE — Progress Notes (Signed)
Subjective:    Patient ID: [name] [name], male    DOB: 1965-06-30, 50 y.o.   MRN: [id number]  HPI    Review of Systems     Objective:   Physical Exam        Assessment & Plan:   [name] [name] is a 50 y.o. male who presents for annual wellness visit and follow-up on chronic medical conditions.  He has the following concerns:   Immunization History  Administered Date(s) Administered  . Tdap 04/24/2010   Last colonoscopy: In last 6 months  Last PSA: Last visit with [employer] Dentist:Dr. Earlie Lou Ophtho:Can't remember name, on [place] Exercise:No  Other doctors caring for patient include:   Depression screen:  See questionnaire below.     Depression screen [facility] 2/9 06/26/2015 10/21/2013  Decreased Interest 0 1  Down, Depressed, Hopeless 0 1  PHQ - 2 Score 0 2    Fall Screen: See Questionaire below.   Fall Risk  06/26/2015  Falls in the past year? No    ADL screen:  See questionnaire below.  Functional Status Survey: Is the patient deaf or have difficulty hearing?: No Does the patient have difficulty seeing, even when wearing glasses/contacts?: No Does the patient have difficulty concentrating, remembering, or making decisions?: Yes (Goes with the MS) Does the patient have difficulty walking or climbing stairs?: Yes Does the patient have difficulty dressing or bathing?: No Does the patient have difficulty doing errands alone such as visiting a doctor's office or shopping?: No   End of Life Discussion:  Patient has a living will and medical power of attorney   Review of Systems  Constitutional: -fever, -chills, -sweats, -unexpected weight change, -anorexia, -fatigue Allergy: -sneezing, -itching, -congestion Dermatology: denies changing moles, rash, lumps, new worrisome lesions ENT: -runny nose, -ear pain, -sore throat, -hoarseness, -sinus pain, -teeth pain, -tinnitus, -hearing loss, -epistaxis Cardiology:  -chest pain, -palpitations, -edema, -orthopnea,  -paroxysmal nocturnal dyspnea Respiratory: -cough, -shortness of breath, -dyspnea on exertion, -wheezing, -hemoptysis Gastroenterology: -abdominal pain, -nausea, -vomiting, -diarrhea, -constipation, -blood in stool, -changes in bowel movement, -dysphagia Hematology: -bleeding or bruising problems Musculoskeletal: -arthralgias, -myalgias, -joint swelling, -back pain, -neck pain, -cramping, -gait changes Ophthalmology: -vision changes, -eye redness, -itching, -discharge Urology: -dysuria, -difficulty urinating, -hematuria, -urinary frequency, -urgency, incontinence Neurology: -headache, -weakness, -tingling, -numbness, -speech abnormality, -memory loss, -falls, -dizziness Psychology:  -depressed mood, -agitation, -sleep problems   PHYSICAL EXAM:  There were no vitals taken for this visit.  General Appearance: Alert, cooperative, no distress, appears stated age Head: Normocephalic, without obvious abnormality, atraumatic Eyes: PERRL, conjunctiva/corneas clear, EOM's intact, fundi benign Ears: Normal TM's and external ear canals Nose: Nares normal, mucosa normal, no drainage or sinus   tenderness Throat: Lips, mucosa, and tongue normal; teeth and gums normal Neck: Supple, no lymphadenopathy, thyroid:no enlargement/tenderness/nodules; no carotid bruit or JVD Back: Spine nontender, no curvature, ROM normal, no CVA tenderness Lungs: Clear to auscultation bilaterally without wheezes, rales or ronchi; respirations unlabored Chest Wall: No tenderness or deformity Heart: Regular rate and rhythm, S1 and S2 normal, no murmur, rub or gallop Breast Exam: No chest wall tenderness, masses or gynecomastia Abdomen: Soft, non-tender, nondistended, normoactive bowel sounds, no masses, no hepatosplenomegaly Genitalia: Normal male external genitalia without lesions.  Testicles without masses.  No inguinal hernias. Rectal: Normal sphincter tone, no masses or tenderness; guaiac negative stool.  Prostate smooth,  no nodules, not enlarged. Extremities: No clubbing, cyanosis or edema Pulses: 2+ and symmetric all extremities Skin: Skin color, texture, turgor normal, no rashes or  lesions Lymph nodes: Cervical, supraclavicular, and axillary nodes normal Neurologic: CNII-XII intact, normal strength, sensation and gait; reflexes 2+ and symmetric throughout   Psych: Normal mood, affect, hygiene and grooming  ASSESSMENT/PLAN:    Discussed PSA screening (risks/benefits), recommended at least 30 minutes of aerobic activity at least 5 days/week; proper sunscreen use reviewed; healthy diet and alcohol recommendations (less than or equal to 2 drinks/day) reviewed; regular seatbelt use; changing batteries in smoke detectors. Immunization recommendations discussed.  Colonoscopy recommendations reviewed.   Medicare Attestation I have personally reviewed: The patient's medical and social history Their use of alcohol, tobacco or illicit drugs Their current medications and supplements The patient's functional ability including ADLs,fall risks, home safety risks, cognitive, and hearing and visual impairment Diet and physical activities Evidence for depression or mood disorders  The patient's weight, height, and BMI have been recorded in the chart.  I have made referrals, counseling, and provided education to the patient based on review of the above and I have provided the patient with a written personalized care plan for preventive services.     Wyatt Haste, MD   06/26/2015

## 2015-07-16 DIAGNOSIS — Z79899 Other long term (current) drug therapy: Secondary | ICD-10-CM | POA: Diagnosis not present

## 2015-07-16 DIAGNOSIS — R269 Unspecified abnormalities of gait and mobility: Secondary | ICD-10-CM | POA: Diagnosis not present

## 2015-07-16 DIAGNOSIS — G35 Multiple sclerosis: Secondary | ICD-10-CM | POA: Diagnosis not present

## 2015-07-17 DIAGNOSIS — G35 Multiple sclerosis: Secondary | ICD-10-CM | POA: Diagnosis not present

## 2015-07-19 ENCOUNTER — Encounter: Payer: Self-pay | Admitting: Family Medicine

## 2015-07-20 ENCOUNTER — Other Ambulatory Visit: Payer: Self-pay | Admitting: Family Medicine

## 2015-07-20 MED ORDER — SILDENAFIL CITRATE 100 MG PO TABS: 100.0000 mg | ORAL_TABLET | Freq: Every day | ORAL | Status: DC | PRN

## 2015-07-20 NOTE — Progress Notes (Signed)
He works prescription written for Viagra. He will get up from a [place].

## 2015-08-14 DIAGNOSIS — G35 Multiple sclerosis: Secondary | ICD-10-CM | POA: Diagnosis not present

## 2015-08-20 ENCOUNTER — Encounter: Payer: Self-pay | Admitting: Family Medicine

## 2015-08-20 ENCOUNTER — Ambulatory Visit
Admission: RE | Admit: 2015-08-20 | Discharge: 2015-08-20 | Disposition: A | Discharge status: Home / Self Care | Payer: Medicare Other | Source: Ambulatory Visit | Attending: Family Medicine | Admitting: Family Medicine

## 2015-08-20 ENCOUNTER — Ambulatory Visit (INDEPENDENT_AMBULATORY_CARE_PROVIDER_SITE_OTHER): Payer: Medicare Other | Admitting: Family Medicine

## 2015-08-20 VITALS — BP 116/70 | HR 77 | Wt 184.4 lb

## 2015-08-20 DIAGNOSIS — R197 Diarrhea, unspecified: Secondary | ICD-10-CM

## 2015-08-20 DIAGNOSIS — M25512 Pain in left shoulder: Secondary | ICD-10-CM

## 2015-08-20 NOTE — Progress Notes (Signed)
   Subjective:    Patient ID: [name] [name], male    DOB: [birth year], 50 y.o.   MRN: [id number]  HPI  he is here for 2 issues. He has a long history of difficulty with diarrhea and has had an extensive workup for this. So far everything is, negative. He has seen a nutritionist for this. Mention was made of possibility of food intolerance/allergies. He apparently has responded minimally to probiotics being added to his regimen. He also complains a one-month history of left shoulder pain. He describes pain with abduction, abduction as well as external rotation. No history of injury. No laxity his reported.   Review of Systems     Objective:   Physical Exam  alert and in no distress. Pain on active motion as well as passive motion of the shoulder with abduction, abduction and external rotation. Negative sulcus test. No laxity is noted. Full exam, difficult to do due to the pain.  previous medical record for GI evaluation was reviewed.      Assessment & Plan:  Diarrhea, unspecified type - Plan: Food Allergy Panel  Left shoulder pain - Plan: DG Shoulder Left  I will check food panel. Discussed the fact that a fluid pill sometimes can be very difficult to fully assess. We'll also get an x-ray on his shoulder. Discussed follow-up on this if it was negative , an injection into the subacromial bursa might be appropriate. He is comfortable with that.

## 2015-08-21 LAB — FOOD ALLERGY PANEL
Clams: <0.1 kU/L
Corn: <0.1 kU/L
Walnut: <0.1 kU/L
Wheat IgE: <0.1 kU/L

## 2015-09-25 DIAGNOSIS — R21 Rash and other nonspecific skin eruption: Secondary | ICD-10-CM | POA: Diagnosis not present

## 2015-09-25 DIAGNOSIS — G35 Multiple sclerosis: Secondary | ICD-10-CM | POA: Diagnosis not present

## 2015-09-25 DIAGNOSIS — R0789 Other chest pain: Secondary | ICD-10-CM | POA: Diagnosis not present

## 2015-09-25 DIAGNOSIS — Z79899 Other long term (current) drug therapy: Secondary | ICD-10-CM | POA: Diagnosis not present

## 2015-10-09 DIAGNOSIS — G35 Multiple sclerosis: Secondary | ICD-10-CM | POA: Diagnosis not present

## 2015-10-30 ENCOUNTER — Ambulatory Visit (INDEPENDENT_AMBULATORY_CARE_PROVIDER_SITE_OTHER): Payer: Medicare Other | Admitting: Family Medicine

## 2015-10-30 ENCOUNTER — Encounter: Payer: Self-pay | Admitting: Family Medicine

## 2015-10-30 VITALS — BP 90/60 | HR 70 | Ht 69.0 in | Wt 174.0 lb

## 2015-10-30 DIAGNOSIS — G35 Multiple sclerosis: Secondary | ICD-10-CM | POA: Diagnosis not present

## 2015-10-30 DIAGNOSIS — N529 Male erectile dysfunction, unspecified: Secondary | ICD-10-CM

## 2015-10-30 DIAGNOSIS — E291 Testicular hypofunction: Secondary | ICD-10-CM

## 2015-10-30 DIAGNOSIS — Z86010 Personal history of colonic polyps: Secondary | ICD-10-CM

## 2015-10-30 NOTE — Progress Notes (Deleted)
Subjective:   HPI  [name] [name] is a 50 y.o. male who presents for a complete physical.  Medical care team includes:  [facility]  Diamantina Monks Neru   Preventative care: Last ophthalmology visit:2 years ago Last dental visit: 2 years ago Last colonoscopy:02/08/15 Last prostate exam: ? Last EKG:12/29/12 Last labs:10/2014  Prior vaccinations: TD or Tdap:04/24/2010 Influenza:no Pneumococcal:no Shingles/Zostavax:no Other:   Advanced directive: Health care power of attorney: Living will:  Concerns:   Reviewed their medical, surgical, family, social, medication, and allergy history and updated chart as appropriate.    Review of Systems Constitutional: -fever, -chills, -sweats, -unexpected weight change, -decreased appetite, -fatigue Allergy: -sneezing, -itching, -congestion Dermatology: -changing moles, --rash, -lumps ENT: -runny nose, -ear pain, -sore throat, -hoarseness, -sinus pain, -teeth pain, - ringing in ears, -hearing loss, -nosebleeds Cardiology: -chest pain, -palpitations, -swelling, -difficulty breathing when lying flat, -waking up short of breath Respiratory: -cough, -shortness of breath, -difficulty breathing with exercise or exertion, -wheezing, -coughing up blood Gastroenterology: -abdominal pain, -nausea, -vomiting, -diarrhea, -constipation, -blood in stool, -changes in bowel movement, -difficulty swallowing or eating Hematology: -bleeding, -bruising  Musculoskeletal: -joint aches, -muscle aches, -joint swelling, -back pain, -neck pain, -cramping, -changes in gait Ophthalmology: denies vision changes, eye redness, itching, discharge Urology: -burning with urination, -difficulty urinating, -blood in urine, -urinary frequency, -urgency, -incontinence Neurology: -headache, -weakness, -tingling, -numbness, -memory loss, -falls, -dizziness Psychology: -depressed mood, -agitation, -sleep problems     Objective:   Physical Exam  There were no vitals filed for this  visit.  General appearance: alert, no distress, WD/WN,  Skin: HEENT: normocephalic, conjunctiva/corneas normal, sclerae anicteric, PERRLA, EOMi, nares patent, no discharge or erythema, pharynx normal Oral cavity: MMM, tongue normal, teeth normal Neck: supple, no lymphadenopathy, no thyromegaly, no masses, normal ROM Chest: non tender, normal shape and expansion Heart: RRR, normal S1, S2, no murmurs Lungs: CTA bilaterally, no wheezes, rhonchi, or rales Abdomen: +bs, soft, non tender, non distended, no masses, no hepatomegaly, no splenomegaly, no bruits Back: non tender, normal ROM, no scoliosis Musculoskeletal: upper extremities non tender, no obvious deformity, normal ROM throughout, lower extremities non tender, no obvious deformity, normal ROM throughout Extremities: no edema, no cyanosis, no clubbing Pulses: 2+ symmetric, upper and lower extremities, normal cap refill Neurological: alert, oriented x 3, CN2-12 intact, strength normal upper extremities and lower extremities, sensation normal throughout, DTRs 2+ throughout, no cerebellar signs, gait normal Psychiatric: normal affect, behavior normal, pleasant  GU: normal male external genitalia, nontender, no masses, no hernia, no lymphadenopathy Rectal:    Assessment and Plan :      Physical exam - discussed healthy lifestyle, diet, exercise, preventative care, vaccinations, and addressed their concerns.        Follow-up

## 2015-10-30 NOTE — Progress Notes (Signed)
   Subjective:    Patient ID: [name] [name], [name] [name]    DOB: 11/15/1965, 50 y.o.   MRN: [id number]  HPI he is here for medication management visit. He does state that his shoulder is getting much better and is not interested in further evaluation. He does have underlying MS which has caused difficulty with weakness, gait disturbance and also an MRI that did show changes consistent with MS. He is cared for at [facility]. They recently did blood work prior to him starting on a new regimen. That blood work was reviewed. He does occasionally need Viagra to help with his erectile dysfunction. The GI issues he was having in the past have cleared since he has started eating fresh fruits and vegetables as well as getting on a probiotic. He does have a previous history of colonic polyps. He also has a previous history of hypogonadism but presently is on no medications.  Review of Systems     Objective:   Physical Exam Alert and in no distress otherwise not examined       Assessment & Plan:  Erectile dysfunction, unspecified erectile dysfunction type  Multiple sclerosis ([place])  History of colonic polyps  Hypogonadism [name] [name] - Plan: Testosterone  Overall he seems to be doing quite nicely. He will call when he needs refill on the Viagra. He will continue to be followed at [facility] for his multiple sclerosis. I will follow-up on the testosterone and call him. No intervention needed on the colonic polyps at this time. Over 25 minutes, greater than 50% spent in counseling and coordination of care.

## 2015-10-31 LAB — TESTOSTERONE: Testosterone: 151 ng/dL — ABNORMAL LOW (ref 250–827)

## 2015-11-15 DIAGNOSIS — G35 Multiple sclerosis: Secondary | ICD-10-CM | POA: Diagnosis not present

## 2015-12-21 DIAGNOSIS — G379 Demyelinating disease of central nervous system, unspecified: Secondary | ICD-10-CM | POA: Diagnosis not present

## 2015-12-21 DIAGNOSIS — G35 Multiple sclerosis: Secondary | ICD-10-CM | POA: Diagnosis not present

## 2016-02-20 DIAGNOSIS — H10413 Chronic giant papillary conjunctivitis, bilateral: Secondary | ICD-10-CM | POA: Diagnosis not present

## 2016-04-02 DIAGNOSIS — G35 Multiple sclerosis: Secondary | ICD-10-CM | POA: Diagnosis not present

## 2016-05-06 DIAGNOSIS — G35 Multiple sclerosis: Secondary | ICD-10-CM | POA: Diagnosis not present

## 2016-05-06 DIAGNOSIS — M62838 Other muscle spasm: Secondary | ICD-10-CM | POA: Diagnosis not present

## 2016-05-06 DIAGNOSIS — R269 Unspecified abnormalities of gait and mobility: Secondary | ICD-10-CM | POA: Diagnosis not present

## 2016-05-06 DIAGNOSIS — R51 Headache: Secondary | ICD-10-CM | POA: Diagnosis not present

## 2016-05-06 DIAGNOSIS — Z79899 Other long term (current) drug therapy: Secondary | ICD-10-CM | POA: Diagnosis not present

## 2016-05-28 DIAGNOSIS — M50322 Other cervical disc degeneration at C5-C6 level: Secondary | ICD-10-CM | POA: Diagnosis not present

## 2016-05-28 DIAGNOSIS — M50222 Other cervical disc displacement at C5-C6 level: Secondary | ICD-10-CM | POA: Diagnosis not present

## 2016-05-28 DIAGNOSIS — M50221 Other cervical disc displacement at C4-C5 level: Secondary | ICD-10-CM | POA: Diagnosis not present

## 2016-05-28 DIAGNOSIS — G35 Multiple sclerosis: Secondary | ICD-10-CM | POA: Diagnosis not present

## 2016-05-28 DIAGNOSIS — M50223 Other cervical disc displacement at C6-C7 level: Secondary | ICD-10-CM | POA: Diagnosis not present

## 2016-08-06 ENCOUNTER — Telehealth: Payer: Self-pay | Admitting: Family Medicine

## 2016-08-06 MED ORDER — SILDENAFIL CITRATE 20 MG PO TABS: ORAL_TABLET | ORAL | 5 refills | Status: DC

## 2016-08-06 NOTE — Telephone Encounter (Signed)
Pt came in and stated his insurance will not pay for Sildenafil. He would like this sent to [facility] as listed per pt:  Sildenafil 20 MG #50 take 2-5 tablets as needed for sexual activity.  Pt can be reached at [phone]

## 2016-08-06 NOTE — Telephone Encounter (Signed)
I took care of it 

## 2016-10-07 ENCOUNTER — Encounter: Payer: Self-pay | Admitting: Medical

## 2016-10-07 ENCOUNTER — Ambulatory Visit (INDEPENDENT_AMBULATORY_CARE_PROVIDER_SITE_OTHER): Payer: Medicare Other | Admitting: Medical

## 2016-10-07 VITALS — BP 110/68 | HR 74 | Wt 168.8 lb

## 2016-10-07 DIAGNOSIS — R0600 Dyspnea, unspecified: Secondary | ICD-10-CM

## 2016-10-07 DIAGNOSIS — T783XXA Angioneurotic edema, initial encounter: Secondary | ICD-10-CM

## 2016-10-07 DIAGNOSIS — T7840XA Allergy, unspecified, initial encounter: Secondary | ICD-10-CM

## 2016-10-07 MED ORDER — EPINEPHRINE 0.3 MG/0.3ML IJ SOAJ: 0.3000 mg | Freq: Once | INTRAMUSCULAR | 0 refills | Status: AC

## 2016-10-07 NOTE — Progress Notes (Signed)
Subjective: Chief Complaint  Patient presents with  . discuss getting a referral     had reaction to nuts over the weekend throat closed up   Here for allergic reaction.  Few days ago had eaten 2 [place] nuts back to back.  Within minutes felt ear canal and throat felt itchy, started to feel SOB. Took benadryl, and after a while felt somewhat improved, but ended up taking another benadryl latera that day. It took about 24 hours to feel back to normal.  Denies any other exposures or foods around the same time.  Does have prior allergy to poison ivy and penicillin.   Has multiple sclerosis/  Per Dr. Eulah Citizen recommendations has been doing some [place] nuts in his smoothies.  2 weeks ago felt a little irritation after one of the smoothies he had which had some [place] nuts.  No other aggravating or relieving factors. No other complaint.  Past Medical History:  Diagnosis Date  . Heart murmur   . Multiple sclerosis ([place])    dx 2014  . Neuromuscular disorder ([place]) 10/2012   "high probability of MS"  . 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 Other postprocedural status(V45.89)    inguinal herniorrhaphies bilateral   . Sleep apnea    cpap  . Testosterone deficiency 11/04/11   Current Outpatient Prescriptions on File Prior to Visit  Medication Sig Dispense Refill  . natalizumab 300 mg in sodium chloride 0.9 % 100 mL Inject 300 mg into the vein every 28 (twenty-eight) days.     . sildenafil (VIAGRA) 100 MG tablet Take 1 tablet (100 mg total) by mouth daily as needed for erectile dysfunction. 30 tablet 5   No current facility-administered medications on file prior to visit.    ROS as in subjective   Objective BP 110/68   Pulse 74   Wt 168 lb 12.8 oz (76.6 kg)   SpO2 99%   BMI 24.93 kg/m   General appearance: alert, no distress, WD/WN,  HEENT: normocephalic, sclerae anicteric, TMs pearly, nares patent, no discharge or erythema, pharynx normal Oral cavity:  MMM, no lesions Neck: supple, no lymphadenopathy, no thyromegaly, no masses lungs: CTA bilaterally, no wheezes, rhonchi, or rales No obvious edema or angioedema    Assessment: Encounter Diagnoses  Name Primary?  . Allergic reaction, initial encounter Yes  . Angioedema, initial encounter   . Dyspnea, unspecified type     Plan:  Discussed his recent concerns, possible tree nut allergy. Referral to allergist for testing.  No antihistamines within 72 hours of visit  In the event of recurrent allergic reaction symptom such as angioedema, dyspnea, begin epi pen, benadryl 50mg  dose at the time of symptoms, and call 911/go to the ED.     Answered his questions   Adonis was seen today for discuss getting a referral.  Diagnoses and all orders for this visit:  Allergic reaction, initial encounter -     Ambulatory referral to Allergy  Angioedema, initial encounter -     Ambulatory referral to Allergy  Dyspnea, unspecified type -     Ambulatory referral to Allergy  Other orders -     EPINEPHrine (EPIPEN 2-PAK) 0.3 mg/0.3 mL IJ SOAJ injection; Inject 0.3 mLs (0.3 mg total) into the muscle once.

## 2016-10-14 IMAGING — CT CT ABD-PELV W/ CM
2 of 6 series · 16 of 46 positions shown, 18 images · IV contrast (OMNIPAQUE)
Comparison: 12/23/2012.

CLINICAL DATA: Initial encounter for 4 month history of lower
abdominal pain and diarrhea.

EXAM:
CT ABDOMEN AND PELVIS WITH CONTRAST
TECHNIQUE: Multidetector CT imaging of the abdomen and pelvis was performed
using the standard protocol following bolus administration of
intravenous contrast.
CONTRAST:  100mL OMNIPAQUE IOHEXOL 300 MG/ML  SOLN

[Series 2: rtn a/p with · axial · 0.77mm/px · z∈[-496,-90]mm · 13 of 93 slices shown, 15 images]
[im 6/93  soft-tissue]
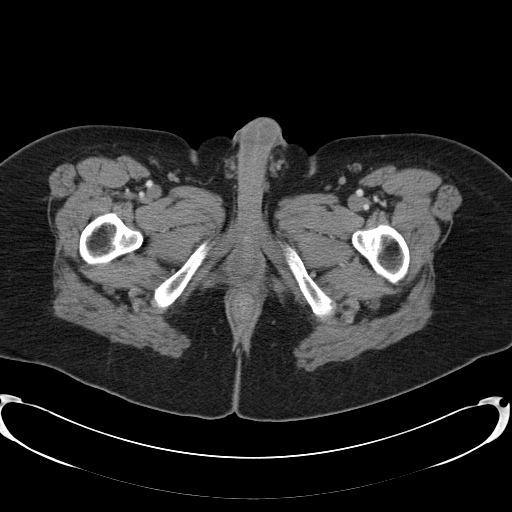
[im 6/93  bone]
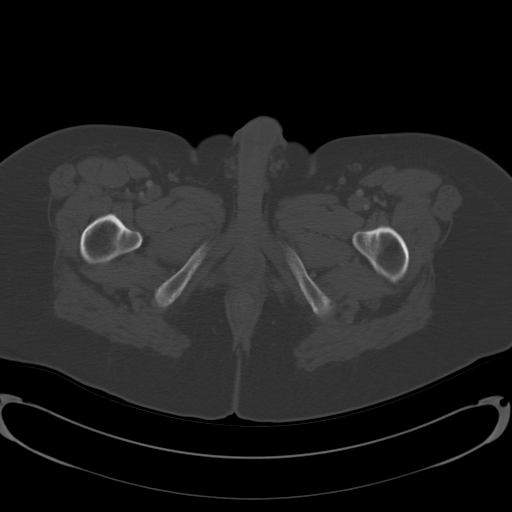
[im 12/93  soft-tissue]
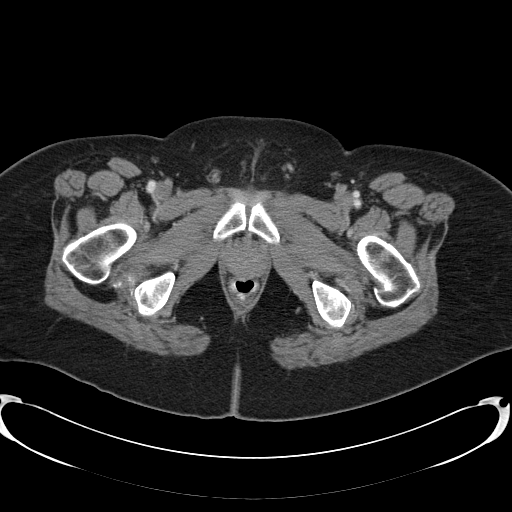
[im 18/93  soft-tissue]
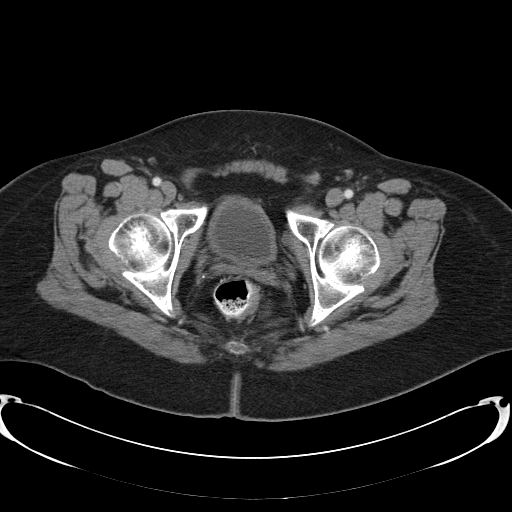
[im 29/93  soft-tissue]
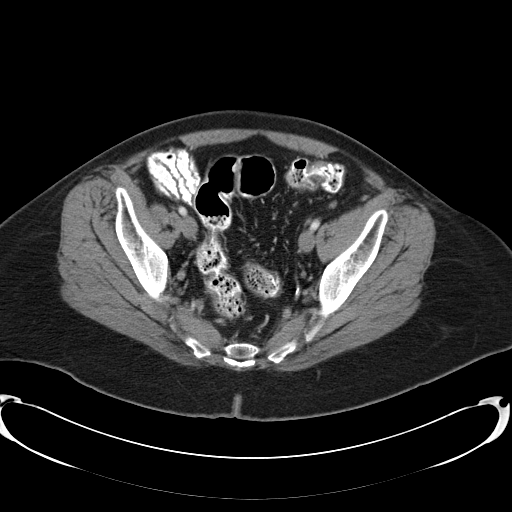
[im 35/93  soft-tissue]
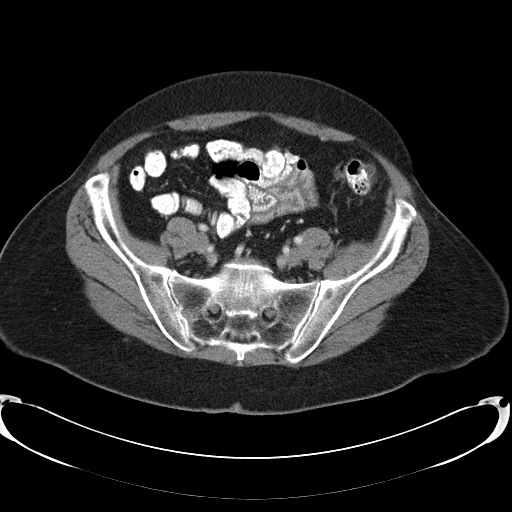
[im 41/93  soft-tissue]
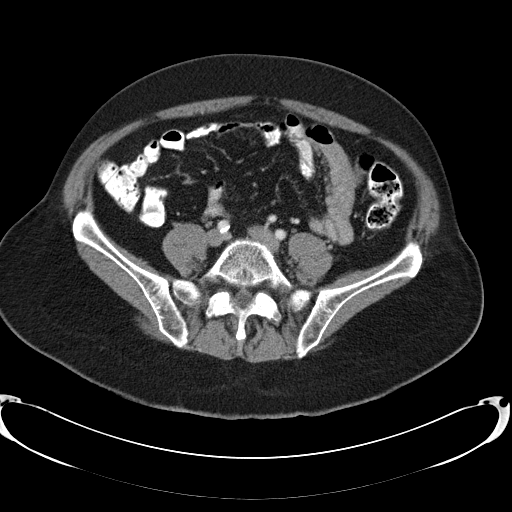
[im 47/93  soft-tissue]
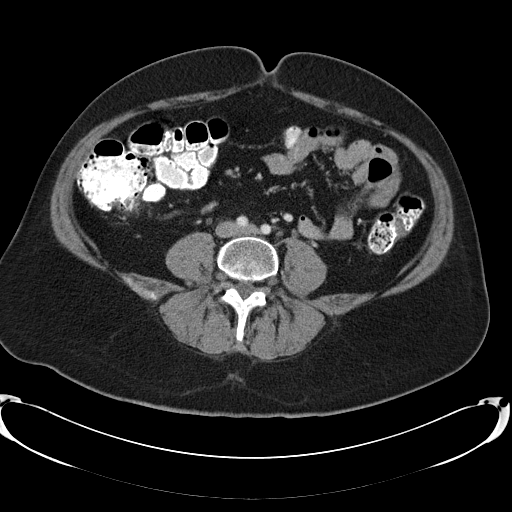
[im 52/93  soft-tissue]
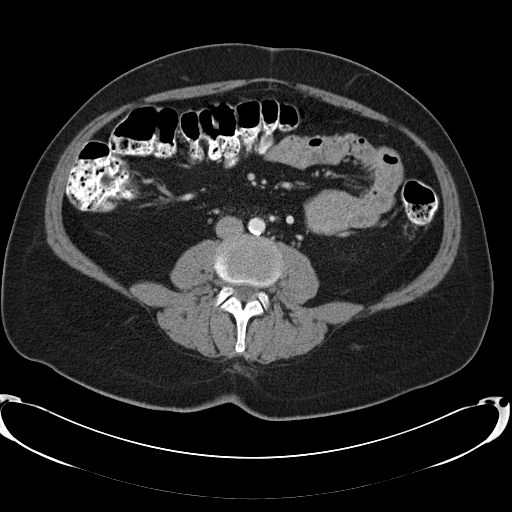
[im 58/93  soft-tissue]
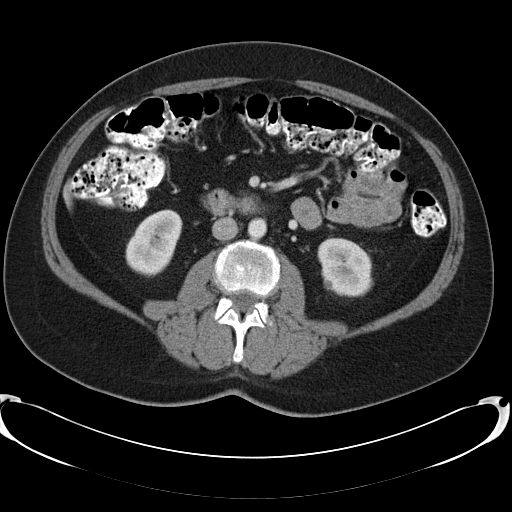
[im 58/93  bone]
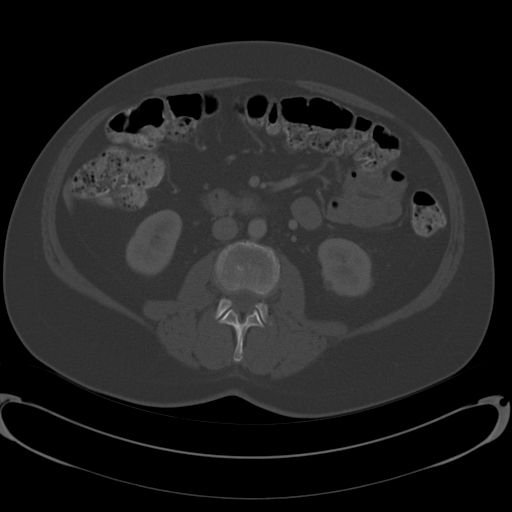
[im 64/93  soft-tissue]
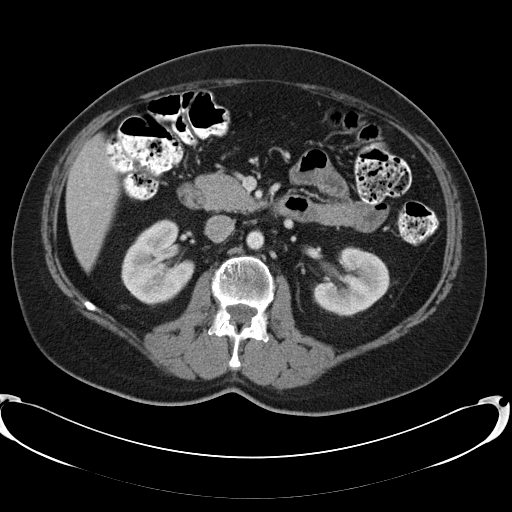
[im 75/93  soft-tissue]
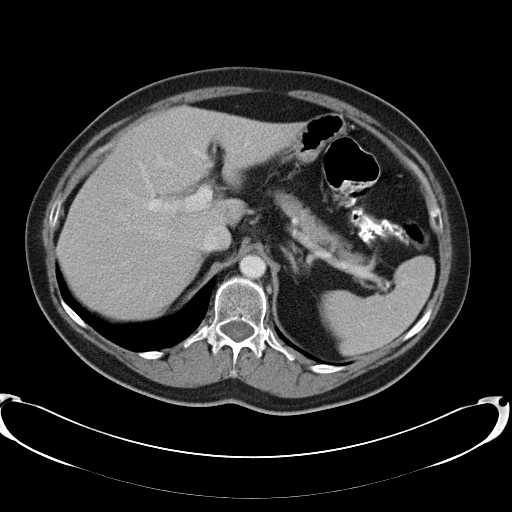
[im 81/93  soft-tissue]
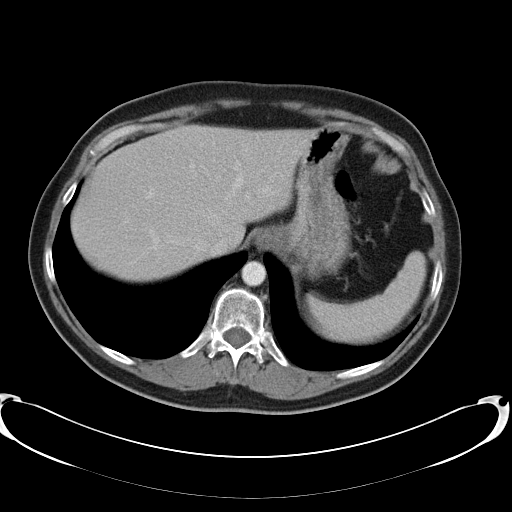
[im 87/93  soft-tissue]
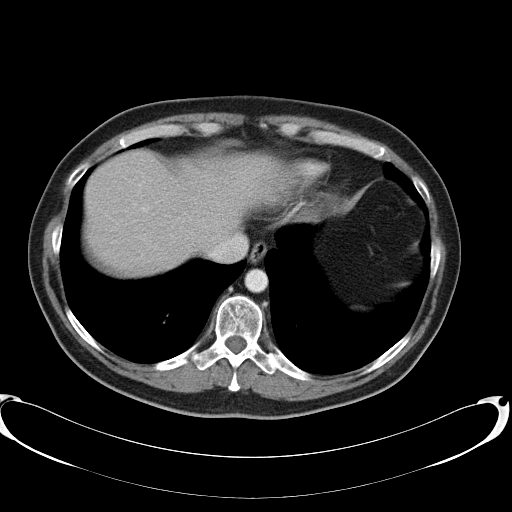

[Series 602: <mpr thick range> · coronal · 0.91mm/px · 3 of 97 slices shown]
[im 33/97  soft-tissue]
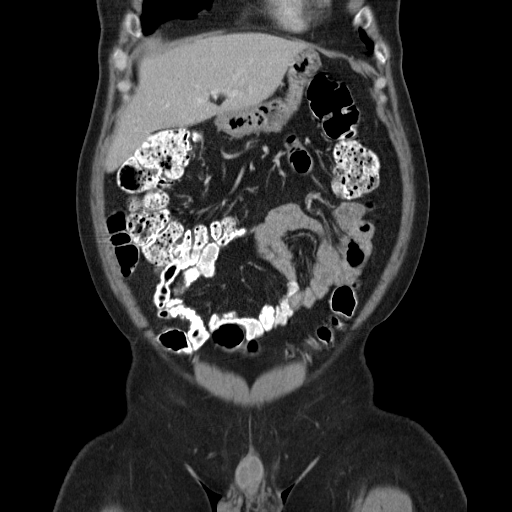
[im 43/97  soft-tissue]
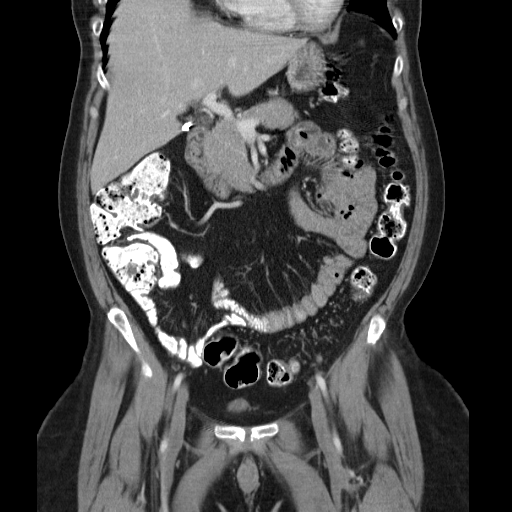
[im 54/97  soft-tissue]
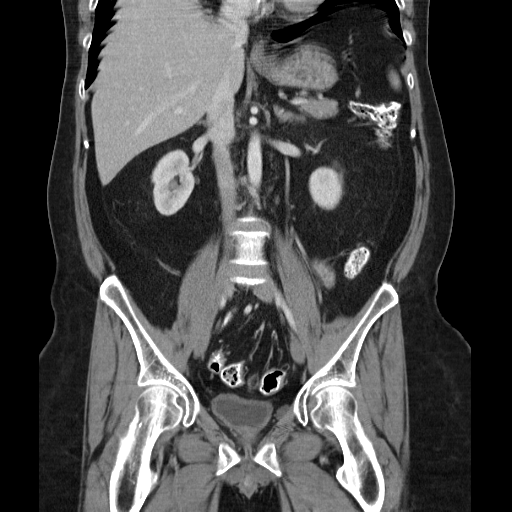

[16 of 46 positions shown; findings below may reference images not displayed]

FINDINGS: Lower chest:  Unremarkable.

Hepatobiliary: No focal abnormality within the liver parenchyma.
Gallbladder surgically absent. No intrahepatic or extrahepatic
biliary dilation.

Pancreas: No focal mass lesion. No dilatation of the main duct. No
intraparenchymal cyst. No peripancreatic edema.

Spleen: No splenomegaly. No focal mass lesion.

Adrenals/Urinary Tract: No adrenal nodule or mass. Kidneys are
unremarkable. No evidence for hydroureter. The urinary bladder
appears normal for the degree of distention.

Stomach/Bowel: Stomach is nondistended. No gastric wall thickening.
No evidence of outlet obstruction. Duodenum is normally positioned
as is the ligament of Treitz. No small bowel wall thickening. No
small bowel dilatation. The terminal ileum is normal. The appendix
is normal. No gross colonic mass. No colonic wall thickening. No
substantial diverticular change.

Vascular/Lymphatic: There is abdominal aortic atherosclerosis
without aneurysm. There is no gastrohepatic or hepatoduodenal
ligament lymphadenopathy. No intraperitoneal or retroperitoneal
lymphadenopy. No pelvic sidewall lymphadenopathy.

Reproductive: The prostate gland and seminal vesicles have normal
imaging features.

Other: No intraperitoneal free fluid.

Musculoskeletal: Bone windows reveal no worrisome lytic or sclerotic
osseous lesions.
IMPRESSION: 1. No acute findings in the abdomen or pelvis. Specifically, no
findings to explain the patient's history of abdominal pain and
diarrhea.
2. Abdominal aortic atherosclerosis.

## 2016-10-24 ENCOUNTER — Ambulatory Visit (INDEPENDENT_AMBULATORY_CARE_PROVIDER_SITE_OTHER): Payer: Medicare Other | Admitting: Allergy and Immunology

## 2016-10-24 ENCOUNTER — Encounter: Payer: Self-pay | Admitting: Allergy and Immunology

## 2016-10-24 VITALS — BP 110/60 | HR 75 | Temp 98.2°F | Resp 16 | Ht 69.29 in | Wt 169.8 lb

## 2016-10-24 DIAGNOSIS — J3089 Other allergic rhinitis: Secondary | ICD-10-CM | POA: Diagnosis not present

## 2016-10-24 DIAGNOSIS — T7800XA Anaphylactic reaction due to unspecified food, initial encounter: Secondary | ICD-10-CM | POA: Insufficient documentation

## 2016-10-24 DIAGNOSIS — H1013 Acute atopic conjunctivitis, bilateral: Secondary | ICD-10-CM

## 2016-10-24 DIAGNOSIS — H1010 Acute atopic conjunctivitis, unspecified eye: Secondary | ICD-10-CM | POA: Insufficient documentation

## 2016-10-24 DIAGNOSIS — T7800XD Anaphylactic reaction due to unspecified food, subsequent encounter: Secondary | ICD-10-CM

## 2016-10-24 MED ORDER — MOMETASONE FUROATE 50 MCG/ACT NA SUSP: 2.0000 | Freq: Every day | NASAL | 5 refills | Status: DC

## 2016-10-24 MED ORDER — OLOPATADINE HCL 0.7 % OP SOLN: 1.0000 [drp] | OPHTHALMIC | 5 refills | Status: DC

## 2016-10-24 MED ORDER — LEVOCETIRIZINE DIHYDROCHLORIDE 5 MG PO TABS: 5.0000 mg | ORAL_TABLET | Freq: Every evening | ORAL | 5 refills | Status: DC

## 2016-10-24 NOTE — Patient Instructions (Addendum)
Food allergy The patient's history suggests food allergy and positive skin test results today confirm this diagnosis.  Meticulous avoidance of treenuts as discussed.  A prescription has been provided for epinephrine auto-injector 2 pack along with instructions for proper administration.  A food allergy action plan has been provided and discussed.  Medic Alert identification is recommended.  Other allergic rhinitis  Aeroallergen avoidance measures have been discussed and provided in written form.  A prescription has been provided for levocetirizine, 5mg  daily as needed.  A prescription has been provided for Nasonex nasal spray, one spray per nostril 1-2 times daily as needed. Proper nasal spray technique has been discussed and demonstrated.  I have also recommended nasal saline spray (i.e., Simply Saline) or nasal saline lavage (i.e., NeilMed) as needed and prior to medicated nasal sprays.  Allergic conjunctivitis  Treatment plan as outlined above for allergic rhinitis.  A prescription has been provided for Pazeo, one drop per eye daily as needed.   Return in about 1 year (around 10/24/2017), or if symptoms worsen or fail to improve.   Reducing Pollen Exposure  The American Academy of Allergy, Asthma and Immunology suggests the following steps to reduce your exposure to pollen during allergy seasons.    1. Do not hang sheets or clothing out to dry; pollen may collect on these items. 2. Do not mow lawns or spend time around freshly cut grass; mowing stirs up pollen. 3. Keep windows closed at night.  Keep car windows closed while driving. 4. Minimize morning activities outdoors, a time when pollen counts are usually at their highest. 5. Stay indoors as much as possible when pollen counts or humidity is high and on windy days when pollen tends to remain in the air longer. 6. Use air conditioning when possible.  Many air conditioners have filters that trap the pollen spores. 7. Use  a HEPA room air filter to remove pollen form the indoor air you breathe.   Control of Mold Allergen  Mold and fungi can grow on a variety of surfaces provided certain temperature and moisture conditions exist.  Outdoor molds grow on plants, decaying vegetation and soil.  The major outdoor mold, Alternaria and Cladosporium, are found in very high numbers during hot and dry conditions.  Generally, a late Summer - Fall peak is seen for common outdoor fungal spores.  Rain will temporarily lower outdoor mold spore count, but counts rise rapidly when the rainy period ends.  The most important indoor molds are Aspergillus and Penicillium.  Dark, humid and poorly ventilated basements are ideal sites for mold growth.  The next most common sites of mold growth are the bathroom and the kitchen.  Outdoor [employer] 1. Use air conditioning and keep windows closed 2. Avoid exposure to decaying vegetation. 3. Avoid leaf raking. 4. Avoid grain handling. 5. Consider wearing a face mask if working in moldy areas.  Indoor Mold Control 1. Maintain humidity below 50%. 2. Clean washable surfaces with 5% bleach solution. 3. Remove sources e.g. Contaminated carpets.

## 2016-10-24 NOTE — Assessment & Plan Note (Signed)
   Treatment plan as outlined above for allergic rhinitis.  A prescription has been provided for Pazeo, one drop per eye daily as needed. 

## 2016-10-24 NOTE — Progress Notes (Signed)
New Patient Note  RE: [name] [name] MRN: [id number] DOB: Jan 21, 1966 Date of Office Visit: 10/24/2016  Referring provider: Carlena Hurl, PA-C Primary care provider: Denita Lung, MD  Chief Complaint: Allergic Reaction (tree nut) and Allergic Rhinitis    History of present illness: [name] [name] is a 51 y.o. male seen today in consultation requested by Chana Bode, PA-C.  Approximately 3 weeks ago, [name] [name] consumed to [place] nuts and within minutes he experiences fragile pruritus, right ear canal pruritus, the sensation of throat tightness, and vocal changes.  He took diphenhydramine and went to bed.  He woke up in the morning without symptoms.  Accompanied later he went to his primary care physician and was prescribed epinephrine autoinjectors.  He reports that he has never had problems consuming pistachios or other tree nuts in the past. He experiences nasal congestion, rhinorrhea, sneezing, nasal pruritus, ocular pruritus, and occasional postnasal drainage and sinus pressure.  These symptoms occur year around but tend to occur more frequently with pollen exposure.   Assessment and plan: Food allergy The patient's history suggests food allergy and positive skin test results today confirm this diagnosis.  Meticulous avoidance of treenuts as discussed.  A prescription has been provided for epinephrine auto-injector 2 pack along with instructions for proper administration.  A food allergy action plan has been provided and discussed.  Medic Alert identification is recommended.  Other allergic rhinitis  Aeroallergen avoidance measures have been discussed and provided in written form.  A prescription has been provided for levocetirizine, 5mg  daily as needed.  A prescription has been provided for Nasonex nasal spray, one spray per nostril 1-2 times daily as needed. Proper nasal spray technique has been discussed and demonstrated.  I have also recommended nasal saline  spray (i.e., Simply Saline) or nasal saline lavage (i.e., NeilMed) as needed and prior to medicated nasal sprays.  Allergic conjunctivitis  Treatment plan as outlined above for allergic rhinitis.  A prescription has been provided for Pazeo, one drop per eye daily as needed.   Meds ordered this encounter  Medications  . levocetirizine (XYZAL) 5 MG tablet    Sig: Take 1 tablet (5 mg total) by mouth every evening.    Dispense:  30 tablet    Refill:  5  . mometasone (NASONEX) 50 MCG/ACT nasal spray    Sig: Place 2 sprays into the nose daily.    Dispense:  17 g    Refill:  5  . Olopatadine HCl (PAZEO) 0.7 % SOLN    Sig: Place 1 drop into both eyes 1 day or 1 dose.    Dispense:  1 Bottle    Refill:  5    Diagnostics: Environmental skin testing: Positive reactivity to grass pollen, molds, and mixed feathers. Food allergen skin testing: Robust positive reactivity to [place] nut.    Physical examination: Blood pressure 110/60, pulse 75, temperature 98.2 F (36.8 C), temperature source Oral, resp. rate 16, height 5' 9.29" (1.76 m), weight 169 lb 12.8 oz (77 kg), SpO2 97 %.  General: Alert, interactive, in no acute distress. HEENT: TMs pearly gray, turbinates mildly edematous without discharge, post-pharynx mildly erythematous. Neck: Supple without lymphadenopathy. Lungs: Clear to auscultation without wheezing, rhonchi or rales. CV: Normal S1, S2 without murmurs. Abdomen: Nondistended, nontender. Skin: Warm and dry, without lesions or rashes. Extremities:  No clubbing, cyanosis or edema. Neuro:   Grossly intact.  Review of systems:  Review of systems negative except as noted in HPI / PMHx or  noted below: Review of Systems  Constitutional: Negative.   HENT: Negative.   Eyes: Negative.   Respiratory: Negative.   Cardiovascular: Negative.   Gastrointestinal: Negative.   Genitourinary: Negative.   Musculoskeletal: Negative.   Skin: Negative.   Neurological: Negative.     Endo/Heme/Allergies: Negative.   Psychiatric/Behavioral: Negative.     Past medical history:  Past Medical History:  Diagnosis Date  . Heart murmur   . Multiple sclerosis ([place])    dx 2014  . Neuromuscular disorder ([place]) 10/2012   "high probability of MS"  . 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 Other postprocedural status(V45.89)    inguinal herniorrhaphies bilateral   . Sleep apnea    cpap  . Testosterone deficiency 11/04/11    Past surgical history:  Past Surgical History:  Procedure Laterality Date  . HERNIA REPAIR Bilateral 1971  . LAPAROSCOPIC CHOLECYSTECTOMY  2014    Family history: Family History  Problem Relation Age of Onset  . Arthritis Mother   . Colon cancer Neg Hx   . Esophageal cancer Neg Hx   . Rectal cancer Neg Hx   . Stomach cancer Neg Hx   . Allergic rhinitis Neg Hx   . Angioedema Neg Hx   . Asthma Neg Hx   . Immunodeficiency Neg Hx   . Urticaria Neg Hx   . Eczema Neg Hx   . Atopy Neg Hx     Social history: Social History   Social History  . Marital status: Married    Spouse name: N/A  . Number of children: 0  . Years of education: N/A   Occupational History  . retired/disabilty    Social History Main Topics  . Smoking status: Former Smoker    Types: Cigarettes    Quit date: 07/14/2010  . Smokeless tobacco: Never Used  . Alcohol use No  . Drug use: No  . Sexual activity: Yes   Other Topics Concern  . Not on file   Social History Narrative  . No narrative on file   Environmental History: The patient lives in a house built in [place] with hardwood floors throughout, gas heat, and central air.  There are 2 cats and 2 dogs in the home which have access to his bedroom.  There is no known mold/water damage in the home.  He is a former cigarette smoker having quit in April 2012.  Allergies as of 10/24/2016      Reactions   Penicillins Rash      Medication List       Accurate as of 10/24/16 11:51  AM. Always use your most recent med list.          dronabinol 5 MG capsule Commonly known as:  MARINOL Take by mouth.   levocetirizine 5 MG tablet Commonly known as:  XYZAL Take 1 tablet (5 mg total) by mouth every evening.   mometasone 50 MCG/ACT nasal spray Commonly known as:  NASONEX Place 2 sprays into the nose daily.   multivitamin tablet Take 1 tablet by mouth daily.   natalizumab 300 mg in sodium chloride 0.9 % 100 mL Inject 300 mg into the vein every 28 (twenty-eight) days.   ocrelizumab 300 MG/10ML injection Commonly known as:  OCREVUS Inject into the vein.   Olopatadine HCl 0.7 % Soln Commonly known as:  PAZEO Place 1 drop into both eyes 1 day or 1 dose.   sildenafil 100 MG tablet Commonly known as:  VIAGRA Take 1 tablet (100 mg total) by mouth daily as needed for erectile dysfunction.       Known medication allergies: Allergies  Allergen Reactions  . Penicillins Rash    I appreciate the opportunity to take part in [name] [name]'s care. Please do not hesitate to contact me with questions.  Sincerely,   R. Edgar Frisk, MD

## 2016-10-24 NOTE — Assessment & Plan Note (Signed)
   Aeroallergen avoidance measures have been discussed and provided in written form.  A prescription has been provided for levocetirizine, 5mg  daily as needed.  A prescription has been provided for Nasonex nasal spray, one spray per nostril 1-2 times daily as needed. Proper nasal spray technique has been discussed and demonstrated.  I have also recommended nasal saline spray (i.e., Simply Saline) or nasal saline lavage (i.e., NeilMed) as needed and prior to medicated nasal sprays.

## 2016-10-24 NOTE — Assessment & Plan Note (Signed)
The patient's history suggests food allergy and positive skin test results today confirm this diagnosis.  Meticulous avoidance of tree nuts as discussed.  A prescription has been provided for epinephrine auto-injector 2 pack along with instructions for proper administration.  A food allergy action plan has been provided and discussed.  Medic Alert identification is recommended. 

## 2016-11-27 DIAGNOSIS — G35 Multiple sclerosis: Secondary | ICD-10-CM | POA: Diagnosis not present

## 2016-12-05 ENCOUNTER — Telehealth: Payer: Self-pay | Admitting: Pediatrics

## 2016-12-05 NOTE — Telephone Encounter (Signed)
Patient was upset that the bill showed "final" notice paid our amount of $109.52 in full.

## 2016-12-05 NOTE — Telephone Encounter (Signed)
Pt had question about bill received. Please call back. Thanks

## 2017-01-26 NOTE — Telephone Encounter (Signed)
Close Encounter 

## 2017-03-17 DIAGNOSIS — Z9229 Personal history of other drug therapy: Secondary | ICD-10-CM | POA: Diagnosis not present

## 2017-03-17 DIAGNOSIS — R269 Unspecified abnormalities of gait and mobility: Secondary | ICD-10-CM | POA: Diagnosis not present

## 2017-03-17 DIAGNOSIS — G35 Multiple sclerosis: Secondary | ICD-10-CM | POA: Diagnosis not present

## 2017-04-11 ENCOUNTER — Ambulatory Visit (HOSPITAL_COMMUNITY)
Admission: EM | Admit: 2017-04-11 | Discharge: 2017-04-11 | Disposition: A | Discharge status: Home / Self Care | Payer: Medicare Other | Attending: Family Medicine | Admitting: Family Medicine

## 2017-04-11 ENCOUNTER — Encounter (HOSPITAL_COMMUNITY): Payer: Self-pay | Admitting: *Deleted

## 2017-04-11 ENCOUNTER — Other Ambulatory Visit: Payer: Self-pay

## 2017-04-11 DIAGNOSIS — J111 Influenza due to unidentified influenza virus with other respiratory manifestations: Secondary | ICD-10-CM | POA: Diagnosis not present

## 2017-04-11 MED ORDER — OSELTAMIVIR PHOSPHATE 75 MG PO CAPS: 75.0000 mg | ORAL_CAPSULE | Freq: Two times a day (BID) | ORAL | 0 refills | Status: DC

## 2017-04-11 MED ORDER — HYDROCODONE-HOMATROPINE 5-1.5 MG/5ML PO SYRP: 5.0000 mL | ORAL_SOLUTION | Freq: Four times a day (QID) | ORAL | 0 refills | Status: DC | PRN

## 2017-04-11 NOTE — ED Triage Notes (Signed)
C/O "feeling weird" last night before bed, then felt throat swelling - took 2 Benadryl feeling it may be an allergic reaction.  C/O sore throat, cough, runny nose.

## 2017-04-14 NOTE — ED Provider Notes (Signed)
[place]   [id number] 04/11/17 Arrival Time: 7322  ASSESSMENT & PLAN:  1. Influenza     Meds ordered this encounter  Medications  . oseltamivir (TAMIFLU) 75 MG capsule    Sig: Take 1 capsule (75 mg total) by mouth every 12 (twelve) hours.    Dispense:  10 capsule    Refill:  0  . HYDROcodone-homatropine (HYCODAN) 5-1.5 MG/5ML syrup    Sig: Take 5 mLs by mouth every 6 (six) hours as needed for cough.    Dispense:  90 mL    Refill:  0    OTC symptom care as needed. Medication sedation precautions. May f/u as needed. Discussed typical duration of symptoms.  Reviewed expectations re: course of current medical issues. Questions answered. Outlined signs and symptoms indicating need for more acute intervention. Patient verbalized understanding. After Visit Summary given.   SUBJECTIVE: History from: patient.  [name] [name] is a 52 y.o. male who presents with complaint of nasal congestion, post-nasal drainage, and a persistent cough. Onset abrupt, approximately 1-2 days ago. Overall fatigued with body aches. SOB: none. Wheezing: none. Fever: yes, subjective. Overall normal PO intake without n/v. Sick contacts: no. OTC treatment: None. Flu shot: no.  Social History   Tobacco Use  Smoking Status Former Smoker  . Types: Cigarettes  . Last attempt to quit: 07/14/2010  . Years since quitting: 6.7  Smokeless Tobacco Never Used    ROS: As per HPI.   OBJECTIVE:  Vitals:   04/11/17 1912  BP: 126/69  Pulse: (!) 101  Resp: (!) 22  Temp: 100.1 F (37.8 C)  TempSrc: Oral  SpO2: 100%     General appearance: alert; appears fatigued HEENT: nasal congestion; clear runny nose; throat irritation secondary to post-nasal drainage Neck: supple without LAD Lungs: clear to auscultation bilaterally; cough: moderate; no respiratory distress Skin: warm and dry Psychological: alert and cooperative; normal mood and affect   Allergies  Allergen Reactions  . Penicillins  Rash    Past Medical History:  Diagnosis Date  . Heart murmur   . Multiple sclerosis ([place])    dx 2014  . Neuromuscular disorder ([place]) 10/2012   "high probability of MS"  . 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 Other postprocedural status(V45.89)    inguinal herniorrhaphies bilateral   . Sleep apnea    cpap  . Testosterone deficiency 11/04/11   Family History  Problem Relation Age of Onset  . Arthritis Mother   . Colon cancer Neg Hx   . Esophageal cancer Neg Hx   . Rectal cancer Neg Hx   . Stomach cancer Neg Hx   . Allergic rhinitis Neg Hx   . Angioedema Neg Hx   . Asthma Neg Hx   . Immunodeficiency Neg Hx   . Urticaria Neg Hx   . Eczema Neg Hx   . Atopy Neg Hx    Social History   Socioeconomic History  . Marital status: Married    Spouse name: Not on file  . Number of children: 0  . Years of education: Not on file  . Highest education level: Not on file  Social Needs  . Financial resource strain: Not on file  . Food insecurity - worry: Not on file  . Food insecurity - inability: Not on file  . Transportation needs - medical: Not on file  . Transportation needs - non-medical: Not on file  Occupational History  . Occupation: retired/disabilty  Tobacco  Use  . Smoking status: Former Smoker    Types: Cigarettes    Last attempt to quit: 07/14/2010    Years since quitting: 6.7  . Smokeless tobacco: Never Used  Substance and Sexual Activity  . Alcohol use: No  . Drug use: No  . Sexual activity: Yes  Other Topics Concern  . Not on file  Social History Narrative  . Not on file           Vanessa Kick, MD 04/14/17 [phone]

## 2017-04-23 DIAGNOSIS — M50222 Other cervical disc displacement at C5-C6 level: Secondary | ICD-10-CM | POA: Diagnosis not present

## 2017-04-23 DIAGNOSIS — M4802 Spinal stenosis, cervical region: Secondary | ICD-10-CM | POA: Diagnosis not present

## 2017-04-23 DIAGNOSIS — G35 Multiple sclerosis: Secondary | ICD-10-CM | POA: Diagnosis not present

## 2017-10-22 DIAGNOSIS — Z79899 Other long term (current) drug therapy: Secondary | ICD-10-CM | POA: Diagnosis not present

## 2017-10-22 DIAGNOSIS — G35 Multiple sclerosis: Secondary | ICD-10-CM | POA: Diagnosis not present

## 2017-10-22 DIAGNOSIS — R209 Unspecified disturbances of skin sensation: Secondary | ICD-10-CM | POA: Diagnosis not present

## 2017-10-22 DIAGNOSIS — R5383 Other fatigue: Secondary | ICD-10-CM | POA: Diagnosis not present

## 2017-10-22 DIAGNOSIS — M6281 Muscle weakness (generalized): Secondary | ICD-10-CM | POA: Diagnosis not present

## 2018-01-04 DIAGNOSIS — H04123 Dry eye syndrome of bilateral lacrimal glands: Secondary | ICD-10-CM | POA: Diagnosis not present

## 2018-04-23 DIAGNOSIS — G35 Multiple sclerosis: Secondary | ICD-10-CM | POA: Diagnosis not present

## 2018-04-23 DIAGNOSIS — Z79899 Other long term (current) drug therapy: Secondary | ICD-10-CM | POA: Diagnosis not present

## 2018-04-29 ENCOUNTER — Telehealth: Payer: Self-pay | Admitting: Family Medicine

## 2018-04-29 NOTE — Telephone Encounter (Signed)
Pt came in and requested a letter. 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 An extra 2 hours of test time is available to him do to his MS diagnoses. In order for him to be granted that time he must have a letter from his PCP. Letter needs to state extra time is required due to pt's MS. Please call pt at [phone] when letter is ready.

## 2018-04-30 ENCOUNTER — Encounter: Payer: Self-pay | Admitting: Family Medicine

## 2018-04-30 NOTE — Telephone Encounter (Signed)
Write the letter

## 2018-04-30 NOTE — Telephone Encounter (Signed)
Letter typed and pt informed ready for pick up

## 2018-05-27 DIAGNOSIS — G35 Multiple sclerosis: Secondary | ICD-10-CM | POA: Diagnosis not present

## 2018-07-16 ENCOUNTER — Ambulatory Visit (INDEPENDENT_AMBULATORY_CARE_PROVIDER_SITE_OTHER): Payer: Medicare Other | Admitting: Family Medicine

## 2018-07-16 ENCOUNTER — Other Ambulatory Visit: Payer: Self-pay

## 2018-07-16 DIAGNOSIS — G35D Multiple sclerosis, unspecified: Secondary | ICD-10-CM

## 2018-07-16 DIAGNOSIS — J3089 Other allergic rhinitis: Secondary | ICD-10-CM | POA: Diagnosis not present

## 2018-07-16 DIAGNOSIS — G35 Multiple sclerosis: Secondary | ICD-10-CM

## 2018-07-16 DIAGNOSIS — E291 Testicular hypofunction: Secondary | ICD-10-CM

## 2018-07-16 DIAGNOSIS — Z860100 Personal history of colon polyps, unspecified: Secondary | ICD-10-CM

## 2018-07-16 NOTE — Progress Notes (Signed)
[name] [name] is a 53 y.o. male who presents for annual wellness visit Documentation for virtual telephone encounter.  Documentation for virtual audio and video telecommunications through [place] encounter:  .  The patient was located at home. The provider was located in the office. The patient did consent to this visit and is aware of possible charges through their insurance for this visit.  The other persons participating in this telemedicine service were none. This virtual service is not related to other E/M service within previous 7 days. He has underlying MS and is being followed at [facility] for this.  He also has a history of colonic polyps and review of the record indicates that he should have repeat colonoscopy this fall.  He also has a history of hypogonadism and apparently he was diagnosed with SR Y translocation.  I reviewed the medical records from [facility] and could not see adequate documentation.  His allergies are under good control.  Has no other concerns or complaints. Immunizations and Health Maintenance Immunization History  Administered Date(s) Administered  . Tdap 04/24/2010   Health Maintenance Due  Topic Date Due  . Hepatitis C Screening  10-03-1965  . HIV Screening  09/22/1978  . COLONOSCOPY  01/28/2017    Last colonoscopy: 01-29-15 Last PSA: PSA Dentist: three weeks ago Ophtho: one year Exercise: walking   Other doctors caring for patient include: DR. Moshe Cipro neurology,   Advanced Directives: No.  Information given.   Depression screen:  See questionnaire below.     Depression screen [facility] 2/9 07/16/2018 06/26/2015 10/21/2013  Decreased Interest 0 0 1  Down, Depressed, Hopeless 0 0 1  PHQ - 2 Score 0 0 2    Fall Screen: See Questionaire below.   Fall Risk  07/16/2018 06/26/2015  Falls in the past year? 0 No    ADL screen:  See questionnaire below.  Functional Status Survey: Is the patient deaf or have difficulty hearing?: No Does the patient  have difficulty seeing, even when wearing glasses/contacts?: No Does the patient have difficulty concentrating, remembering, or making decisions?: No Does the patient have difficulty walking or climbing stairs?: No Does the patient have difficulty dressing or bathing?: No Does the patient have difficulty doing errands alone such as visiting a doctor's office or shopping?: No   Review of Systems  Constitutional: -, -unexpected weight change, -anorexia, -fatigue Allergy: -sneezing, -itching, -congestion Dermatology: denies changing moles, rash, lumps ENT: -runny nose, -ear pain, -sore throat,  Cardiology:  -chest pain, -palpitations, -orthopnea, Respiratory: -cough, -shortness of breath, -dyspnea on exertion, -wheezing,  Gastroenterology: -abdominal pain, -nausea, -vomiting, -diarrhea, -constipation, -dysphagia Urology: -dysuria, -difficulty urinating,  -urinary frequency, -urgency, incontinence    PHYSICAL EXAM:  There were no vitals taken for this visit.   ASSESSMENT/PLAN: Multiple sclerosis (HCC)  History of colonic polyps  Hypogonadism male  Other allergic rhinitis  Discussed PSA screening (risks/benefits), recommended  aerobic activity at least 5 days/week; proper sunscreen use reviewed; healthy diet and alcohol recommendations  changing batteries in smoke detectors. Immunization recommendations discussed.  Colonoscopy recommendations reviewed.  He will also return here in several months for blood work and follow-up based on that.  Also recommend he talk to his doctors at [facility] to see if he does need the pneumonia shots.   Medicare Attestation I have personally reviewed: The patient's medical and social history Their use of alcohol, tobacco or illicit drugs Their current medications and supplements The patient's functional ability including ADLs,fall risks, home safety risks, cognitive, and hearing  and visual impairment Diet and physical activities Evidence for  depression or mood disorders  The patient's weight, height, and BMI have been recorded in the chart.  I have made referrals, counseling, and provided education to the patient based on review of the above and I have provided the patient with a written personalized care plan for preventive services.     Jill Alexanders, MD   07/16/2018

## 2018-07-16 NOTE — Patient Instructions (Signed)
  Mr. Blancett , Thank you for taking time to come for your Medicare Wellness Visit. I appreciate your ongoing commitment to your health goals. Please review the following plan we discussed and let me know if I can assist you in the future.   These are the goals we discussed: Goals   None     This is a list of the screening recommended for you and due dates:  Health Maintenance  Topic Date Due  .  Hepatitis C: One time screening is recommended by Center for Disease Control  (CDC) for  adults born from [birth year] through 1965.   1965-07-20  . HIV Screening  09/22/1978  . Colon Cancer Screening  01/28/2017  . Flu Shot  11/13/2018  . Tetanus Vaccine  04/24/2020

## 2018-08-18 ENCOUNTER — Encounter: Payer: Self-pay | Admitting: Family Medicine

## 2018-08-18 ENCOUNTER — Other Ambulatory Visit: Payer: Self-pay

## 2018-08-18 ENCOUNTER — Ambulatory Visit (INDEPENDENT_AMBULATORY_CARE_PROVIDER_SITE_OTHER): Payer: Medicare Other | Admitting: Family Medicine

## 2018-08-18 VITALS — BP 108/72 | HR 71 | Temp 99.1°F | Wt 177.2 lb

## 2018-08-18 DIAGNOSIS — Z20822 Contact with and (suspected) exposure to covid-19: Secondary | ICD-10-CM

## 2018-08-18 DIAGNOSIS — M79651 Pain in right thigh: Secondary | ICD-10-CM | POA: Diagnosis not present

## 2018-08-18 DIAGNOSIS — Z20828 Contact with and (suspected) exposure to other viral communicable diseases: Secondary | ICD-10-CM | POA: Diagnosis not present

## 2018-08-18 NOTE — Progress Notes (Signed)
   Subjective:    Patient ID: [name] [name], male    DOB: 01-13-1966, 53 y.o.   MRN: [id number]  HPI He states that in January he had viral symptoms of cough and congestion and has concerns over COVID.  Presently he is having no fever, chills, cough or congestion. Also in late January he describes an incident where he was going through a door, slipped and fell backwards landing on the edge of the step with his posterior thigh area.  He developed bruising in that area and is still having some difficulty with some slight discomfort and notes that if he sits for a long period of time he will get a tingling sensation going down his leg.   Review of Systems     Objective:   Physical Exam Alert and in no distress.  Exam of the posterior thigh shows no tenderness or palpable lesions.  The skin is normal.  Pulses are normal.       Assessment & Plan:  Close Exposure to Covid-19 Virus - Plan: SAR CoV2 Serology (COVID 19)AB(IGG)IA  Pain of right thigh I will do the COVID at his request. I explained that the symptoms he is having could possibly represent pressure against the artery or nerve in the posterior thigh area.  Recommend that he change his sitting position since it seems to bother him only with sitting to tie and take some the pressure off the posterior thigh area.  He was comfortable with that.

## 2018-08-19 LAB — SAR COV2 SEROLOGY (COVID19)AB(IGG),IA: SARS-CoV-2 Ab, IgG: NEGATIVE

## 2018-12-15 ENCOUNTER — Other Ambulatory Visit: Payer: Self-pay

## 2018-12-15 ENCOUNTER — Ambulatory Visit (INDEPENDENT_AMBULATORY_CARE_PROVIDER_SITE_OTHER): Payer: Medicare Other | Admitting: Family Medicine

## 2018-12-15 ENCOUNTER — Encounter: Payer: Self-pay | Admitting: Family Medicine

## 2018-12-15 VITALS — BP 106/72 | HR 76 | Temp 99.6°F | Wt 177.8 lb

## 2018-12-15 DIAGNOSIS — G35 Multiple sclerosis: Secondary | ICD-10-CM

## 2018-12-15 DIAGNOSIS — Z23 Encounter for immunization: Secondary | ICD-10-CM

## 2018-12-15 DIAGNOSIS — R1011 Right upper quadrant pain: Secondary | ICD-10-CM | POA: Diagnosis not present

## 2018-12-15 NOTE — Progress Notes (Signed)
   Subjective:    Patient ID: [name] [name], male    DOB: 21-Apr-1965, 53 y.o.   MRN: [id number]  HPI He complains of a 2-week history of right upper quadrant intermittent pain that is made worse with foods but he is not sure exactly when the pain occurs in regard to eating.  He describes it as constant but no associated nausea, vomiting, diarrhea.  He has had previous gallbladder surgery.  He does have underlying MS.   Review of Systems     Objective:   Physical Exam Alert and in no distress.  Cardiac exam shows regular rhythm without murmurs or gallops.  Lungs are clear to auscultation.  Abdominal exam shows no hepatosplenomegaly, masses or tenderness.  Negative Murphy's punch.      Assessment & Plan:  Right upper quadrant pain  Need for influenza vaccination - Plan: Flu Vaccine QUAD 6+ mos PF IM (Fluarix Quad PF)  Multiple sclerosis ([place]) I explained that it is difficult to tell exactly what is causing the trouble.  Recommend try double strength Prilosec or Nexium for the next couple weeks and if no improvement come back for further evaluation.  He was comfortable with that.  Shot also given.

## 2019-01-13 ENCOUNTER — Telehealth: Payer: Self-pay

## 2019-01-13 NOTE — Telephone Encounter (Signed)
Called pt to advise of covid screening needed. [place]

## 2019-01-18 ENCOUNTER — Telehealth: Payer: Self-pay | Admitting: Family Medicine

## 2019-01-18 ENCOUNTER — Other Ambulatory Visit: Payer: Self-pay

## 2019-01-18 ENCOUNTER — Ambulatory Visit (INDEPENDENT_AMBULATORY_CARE_PROVIDER_SITE_OTHER): Payer: Medicare Other | Admitting: Family Medicine

## 2019-01-18 ENCOUNTER — Encounter: Payer: Self-pay | Admitting: Family Medicine

## 2019-01-18 VITALS — BP 120/64 | HR 68 | Temp 98.0°F | Wt 174.8 lb

## 2019-01-18 DIAGNOSIS — Z87891 Personal history of nicotine dependence: Secondary | ICD-10-CM | POA: Diagnosis not present

## 2019-01-18 DIAGNOSIS — F909 Attention-deficit hyperactivity disorder, unspecified type: Secondary | ICD-10-CM

## 2019-01-18 DIAGNOSIS — G35 Multiple sclerosis: Secondary | ICD-10-CM | POA: Diagnosis not present

## 2019-01-18 MED ORDER — AMPHETAMINE-DEXTROAMPHETAMINE 20 MG PO TABS: 20.0000 mg | ORAL_TABLET | Freq: Every day | ORAL | 0 refills | Status: DC

## 2019-01-18 NOTE — Patient Instructions (Signed)
Eye Doctor- [place], [place]

## 2019-01-18 NOTE — Telephone Encounter (Signed)
Pt called. He  was in for a visit today and wanted to make sure his prescription for Adderall was sent to the [facility] on [address]

## 2019-01-18 NOTE — Addendum Note (Signed)
Addended by: Denita Lung on: 01/18/2019 04:32 PM   Modules accepted: Orders

## 2019-01-18 NOTE — Progress Notes (Signed)
   Subjective:    Patient ID: [name] [name], male    DOB: 11-28-1965, 53 y.o.   MRN: [id number]  HPI He is here for consult concerning multiple issues.  He does have a 35-pack-year history of smoking and has concerns about lung cancer screening.  He also complains of intermittent difficulty with numbness sensation to the left side of his body.  He does have an underlying history of MS.  He also does note some right lower quadrant discomfort especially after eating but cannot relate this to any particular foods.  He also complains of urinary urge and decreased stream with no hesitancy, urgency, feeling of incomplete emptying.  He does get up at night but recognizes he does drink a lot of fluids.  He would like the name of a ophthalmologist to follow-up on general eye care.  He also apparently has a previous history of ADD but has not discussed this with me in the past.  He states that he has had symptoms of this since at least junior high and was placed on Adderall several years ago.  He is now interested in getting back on it as he is spending more time on his computer.  He states that the Adderall gave him 6 to 8 hours worth of benefit.   Review of Systems     Objective:   Physical Exam Alert and in no distress.  ADD screening is recorded and does definitely show evidence of ADHD.       Assessment & Plan:  Attention deficit hyperactivity disorder (ADHD), unspecified ADHD type  Multiple sclerosis ([place])  Former smoker Discussed CT scanning with him and the risks as well as the benefits.  He will let me know later if he would like to proceed with the scan.    I have been discussed his urologic symptoms and he states that they are not of major concern.  I therefore recommended that he cut down on his fluids at night to see if that will help with his nocturia.  He was comfortable with that.    I discussed the diagnosis of ADD with him.  I will call him Adderall.  He will be using this on an  as-needed basis.    He will pay more attention to the postprandial right lower quadrant discomfort and let me know if he can associate that with anything in particular.    I explained that his underlying MS can cause a lot of vague neurologic symptoms.  He was comfortable with that. Over 45 minutes, the entire time spent in counseling and coordination of care.

## 2019-01-18 NOTE — Addendum Note (Signed)
Addended by: Denita Lung on: 01/18/2019 04:33 PM   Modules accepted: Orders

## 2019-01-21 ENCOUNTER — Encounter: Payer: Self-pay | Admitting: Family Medicine

## 2019-01-21 DIAGNOSIS — Z122 Encounter for screening for malignant neoplasm of respiratory organs: Secondary | ICD-10-CM

## 2019-01-25 ENCOUNTER — Other Ambulatory Visit: Payer: Self-pay

## 2019-01-25 ENCOUNTER — Encounter: Payer: Self-pay | Admitting: Family Medicine

## 2019-01-25 ENCOUNTER — Ambulatory Visit (INDEPENDENT_AMBULATORY_CARE_PROVIDER_SITE_OTHER): Payer: Medicare Other | Admitting: Family Medicine

## 2019-01-25 VITALS — BP 104/66 | HR 82 | Temp 97.8°F | Wt 175.6 lb

## 2019-01-25 DIAGNOSIS — R3989 Other symptoms and signs involving the genitourinary system: Secondary | ICD-10-CM | POA: Diagnosis not present

## 2019-01-25 DIAGNOSIS — G35 Multiple sclerosis: Secondary | ICD-10-CM

## 2019-01-25 DIAGNOSIS — R109 Unspecified abdominal pain: Secondary | ICD-10-CM

## 2019-01-25 LAB — POCT URINALYSIS DIP (PROADVANTAGE DEVICE)
Bilirubin, UA: NEGATIVE
Blood, UA: NEGATIVE
Glucose, UA: NEGATIVE mg/dL
Ketones, POC UA: NEGATIVE mg/dL
Leukocytes, UA: NEGATIVE
Nitrite, UA: NEGATIVE
Protein Ur, POC: NEGATIVE mg/dL
Specific Gravity, Urine: 1.025
Urobilinogen, Ur: 0.2
pH, UA: 6 (ref 5.0–8.0)

## 2019-01-25 NOTE — Progress Notes (Signed)
   Subjective:    Patient ID: [name] [name], male    DOB: 1965/06/06, 53 y.o.   MRN: [id number]  HPI He complains of right mid quadrant dull discomfort that does tend to come and go but never goes away completely.  This is been going on for roughly a year.  He has noted that certain dietary restrictions do decrease it but he has made several of them including cutting back on gluten.  There is been no nausea, vomiting, diarrhea, fever, chills, constipation.  His bowel habits are okay.  He cannot relate any particular foods.  He does allude to the fact that the symptoms could get worse when he has bouts of MS.   Review of Systems     Objective:   Physical Exam  Alert and in no distress.  Abdominal exam shows decreased bowel sounds without masses or tenderness.      Assessment & Plan:  Multiple sclerosis ([place])  Abdominal distress I discussed various options with him concerning either an elimination type diet, paying more attention to certain foods see if that plays a role or possibly referral to allergist to see if there is any food allergies or intolerances.  He will keep me informed as to what he would like to do

## 2019-01-26 ENCOUNTER — Telehealth: Payer: Self-pay | Admitting: Family Medicine

## 2019-01-26 DIAGNOSIS — G4733 Obstructive sleep apnea (adult) (pediatric): Secondary | ICD-10-CM | POA: Diagnosis not present

## 2019-01-26 DIAGNOSIS — G35 Multiple sclerosis: Secondary | ICD-10-CM | POA: Diagnosis not present

## 2019-01-26 NOTE — Telephone Encounter (Signed)
Pt called to see what the urine results were yesterday from his visit.  He said we were checking his kidneys.  Please call pt [phone]

## 2019-01-26 NOTE — Telephone Encounter (Signed)
Let him know that it was negative

## 2019-01-27 ENCOUNTER — Encounter: Payer: Self-pay | Admitting: Family Medicine

## 2019-01-27 DIAGNOSIS — R109 Unspecified abdominal pain: Secondary | ICD-10-CM

## 2019-01-27 NOTE — Telephone Encounter (Signed)
Pt was advised [facility] 

## 2019-01-27 NOTE — Addendum Note (Signed)
Addended by: Elyse Jarvis on: 01/27/2019 09:13 AM   Modules accepted: Orders

## 2019-01-28 ENCOUNTER — Encounter: Payer: Self-pay | Admitting: Gastroenterology

## 2019-02-02 ENCOUNTER — Ambulatory Visit
Admission: RE | Admit: 2019-02-02 | Discharge: 2019-02-02 | Disposition: A | Discharge status: Home / Self Care | Payer: Medicare Other | Source: Ambulatory Visit | Attending: Family Medicine | Admitting: Family Medicine

## 2019-02-02 DIAGNOSIS — Z87891 Personal history of nicotine dependence: Secondary | ICD-10-CM | POA: Diagnosis not present

## 2019-02-04 ENCOUNTER — Encounter: Payer: Self-pay | Admitting: Gastroenterology

## 2019-02-04 ENCOUNTER — Ambulatory Visit: Payer: Medicare Other | Admitting: Gastroenterology

## 2019-02-04 VITALS — BP 108/62 | HR 75 | Temp 97.8°F | Ht 70.0 in | Wt 176.0 lb

## 2019-02-04 DIAGNOSIS — R109 Unspecified abdominal pain: Secondary | ICD-10-CM | POA: Diagnosis not present

## 2019-02-04 DIAGNOSIS — Z86010 Personal history of colonic polyps: Secondary | ICD-10-CM

## 2019-02-04 NOTE — Patient Instructions (Signed)
If you are age 55 or older, your body mass index should be between 23-30. Your Body mass index is 25.25 kg/m. If this is out of the aforementioned range listed, please consider follow up with your Primary Care Provider.  If you are age 6 or younger, your body mass index should be between 19-25. Your Body mass index is 25.25 kg/m. If this is out of the aformentioned range listed, please consider follow up with your Primary Care Provider.   You have been scheduled for a CT scan of the abdomen and pelvis [facility].  You are scheduled on 02/09/19 at 3:30 pm. You should arrive 15 minutes prior to your appointment time for registration. Please follow the written instructions below on the day of your exam:  WARNING: IF YOU ARE ALLERGIC TO IODINE/X-RAY DYE, PLEASE NOTIFY RADIOLOGY IMMEDIATELY AT [phone]! YOU WILL BE GIVEN A 13 HOUR PREMEDICATION PREP.  1) Do not eat or drink anything after 12:30 pm (4 hours prior to your test) 2) You have been given 2 bottles of oral contrast to drink. The solution may taste better if refrigerated, but do NOT add ice or any other liquid to this solution. Shake well before drinking.    Drink 1 bottle of contrast @ 1:30 pm (2 hours prior to your exam)  Drink 1 bottle of contrast @ 2:30 pm (1 hour prior to your exam)  You may take any medications as prescribed with a small amount of water, if necessary. If you take any of the following medications: METFORMIN, GLUCOPHAGE, GLUCOVANCE, AVANDAMET, RIOMET, FORTAMET, Foyil MET, JANUMET, GLUMETZA or METAGLIP, you MAY be asked to HOLD this medication 48 hours AFTER the exam.  The purpose of you drinking the oral contrast is to aid in the visualization of your intestinal tract. The contrast solution may cause some diarrhea. Depending on your individual set of symptoms, you may also receive an intravenous injection of x-ray contrast/dye. Plan on being at [facility] for 30 minutes or longer, depending on  the type of exam you are having performed.  This test typically takes 30-45 minutes to complete.  If you have any questions regarding your exam or if you need to reschedule, you may call the CT department at [phone] between the hours of 8:00 am and 5:00 pm, Monday-Friday.  _____________________________________________________________________  Thank you for choosing me and [facility].   Alonza Bogus, PA-C

## 2019-02-04 NOTE — Progress Notes (Signed)
02/04/2019 [name] [name] [id number] Sep 28, 1965   HISTORY OF PRESENT ILLNESS: This is a 53 year old male who is a patient of Dr. Lynne Leader.  He had colonoscopy here in September 2015 at which time he had 2 sessile serrated polyps removed.  One was a 10 mm polyp in the transverse colon that was removed by piecemeal.  It was recommended that he have a repeat colonoscopy at 2-year interval.  He ended up having another colonoscopy in 2016 at [facility] for evaluation of complaints of diarrhea.  It appears that at that time he did not have any polyps removed.  They did do biopsies throughout his colon in regards to the diarrhea, but the biopsies were normal.  It ended up being that his diarrhea was coming from some of his medications that were used to treat his MS.  Nonetheless, he is here today with complaints of right-sided abdominal pain.  The pain is right upper quadrant to right mid abdomen.  He says that overall it has been bothering him for about a year, but more frequent/consistent and more severe/noticeable over the past few months.  He says it kind of radiates to his right side.  He does not have a gallbladder.  He says that it can be from a dull ache to sharp pains at times.  He says it almost feels like a raw sensation when food or something moves through that area.  He denies any changes in bowel issues or any rectal bleeding.  No nausea, vomiting, fever, or chills.   Past Medical History:  Diagnosis Date  . Heart murmur   . Multiple sclerosis ([place])    dx 2014  . Neuromuscular disorder ([place]) 10/2012   "high probability of MS"  . 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 Other postprocedural status(V45.89)    inguinal herniorrhaphies bilateral   . Sleep apnea    cpap  . Testosterone deficiency 11/04/11   Past Surgical History:  Procedure Laterality Date  . HERNIA REPAIR Bilateral 1971  . LAPAROSCOPIC CHOLECYSTECTOMY  2014    reports that he quit smoking  about 8 years ago. His smoking use included cigarettes. He has never used smokeless tobacco. He reports that he does not drink alcohol or use drugs. family history includes Arthritis in his mother. Allergies  Allergen Reactions  . Baclofen Hives  . Penicillins Rash      Outpatient Encounter Medications as of 02/04/2019  Medication Sig  . amphetamine-dextroamphetamine (ADDERALL) 20 MG tablet Take 1 tablet (20 mg total) by mouth daily.  Marland Kitchen EPINEPHrine 0.3 mg/0.3 mL IJ SOAJ injection   . levocetirizine (XYZAL) 5 MG tablet TAKE 1 TABLET BY MOUTH EVERY DAY IN THE EVENING  . mometasone (NASONEX) 50 MCG/ACT nasal spray Place into the nose.  . Multiple Vitamin (MULTIVITAMIN) tablet Take 1 tablet by mouth daily.  . Olopatadine HCl (PAZEO) 0.7 % SOLN Place 1 drop into both eyes 1 day or 1 dose.  . sildenafil (VIAGRA) 100 MG tablet Take 1 tablet (100 mg total) by mouth daily as needed for erectile dysfunction.   No facility-administered encounter medications on file as of 02/04/2019.      REVIEW OF SYSTEMS  : All other systems reviewed and negative except where noted in the History of Present Illness.   PHYSICAL EXAM: BP 108/62   Pulse 75   Temp 97.8 F (36.6 C) (Temporal)   Ht 5\' 10"  (1.778 m)   Wt 176 lb (  79.8 kg)   BMI 25.25 kg/m  General: Well developed white male in no acute distress Head: Normocephalic and atraumatic Eyes:  Sclerae anicteric, conjunctiva pink. Ears: Normal auditory acuity Lungs: Clear throughout to auscultation; no increased WOB. Heart: Regular rate and rhythm; no M/R/G. Abdomen: Soft, non-distended.  BS present.  Right upper and right mid-abdominal TTP. Musculoskeletal: Symmetrical with no gross deformities  Skin: No lesions on visible extremities Extremities: No edema  Neurological: Alert oriented x 4, grossly non-focal Psychological:  Alert and cooperative. Normal mood and affect  ASSESSMENT AND PLAN: *Right-sided abdominal pain: Anywhere from a dull  ache to sharp pain that has been present for maybe close to a year, but much more consistent, more noticeable, and worse over the past few months.  Will check CT scan of the abdomen and pelvis with contrast. *Personal history of sessile serrated polyps, one 10 mm in size, removed by piecemeal in 2015.  Then repeat colonoscopy at [facility] in 2016 normal (was done for complaints of diarrhea).  Recall already in for 2021.   CC:  Denita Lung, MD

## 2019-02-07 NOTE — Progress Notes (Signed)
Reviewed and agree with management plan.  Deshundra Waller T. Keelia Graybill, MD FACG [facility]  

## 2019-02-09 ENCOUNTER — Other Ambulatory Visit: Payer: Self-pay

## 2019-02-09 ENCOUNTER — Ambulatory Visit (HOSPITAL_COMMUNITY)
Admission: RE | Admit: 2019-02-09 | Discharge: 2019-02-09 | Disposition: A | Discharge status: Home / Self Care | Payer: Medicare Other | Source: Ambulatory Visit | Attending: Gastroenterology | Admitting: Gastroenterology

## 2019-02-09 DIAGNOSIS — R109 Unspecified abdominal pain: Secondary | ICD-10-CM | POA: Diagnosis not present

## 2019-02-09 MED ORDER — IOHEXOL 300 MG/ML  SOLN
100.0000 mL | Freq: Once | INTRAMUSCULAR | Status: AC | PRN
  Administered 2019-02-09: 100 mL via INTRAVENOUS

## 2019-12-21 DIAGNOSIS — G35 Multiple sclerosis: Secondary | ICD-10-CM | POA: Diagnosis not present

## 2020-07-16 DIAGNOSIS — G35 Multiple sclerosis: Secondary | ICD-10-CM | POA: Diagnosis not present

## 2020-09-02 IMAGING — CT CT ABD-PELV W/ CM
2 of 5 series · 16 of 46 positions shown, 18 images · IV contrast (APPLIED)
Comparison: 03/23/2015

CLINICAL DATA: Right-sided abdominal pain for 4 months

EXAM:
CT ABDOMEN AND PELVIS WITH CONTRAST
TECHNIQUE: Multidetector CT imaging of the abdomen and pelvis was performed
using the standard protocol following bolus administration of
intravenous contrast.
CONTRAST:  100mL OMNIPAQUE IOHEXOL 300 MG/ML SOLN, additional oral
enteric contrast

[Series 2: axial st · axial · 0.80mm/px · z∈[-538,-118]mm · 13 of 98 slices shown, 15 images]
[im 7/98  soft-tissue]
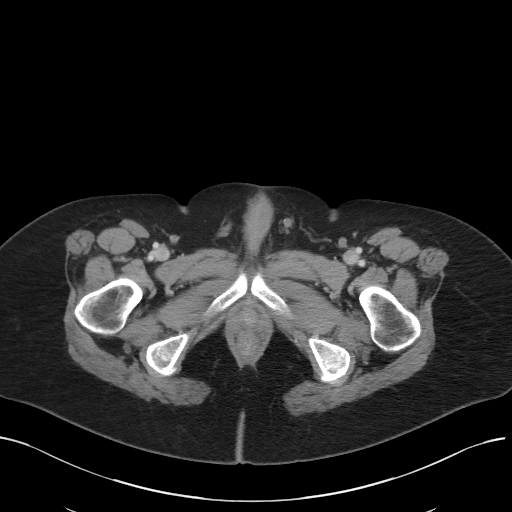
[im 7/98  bone]
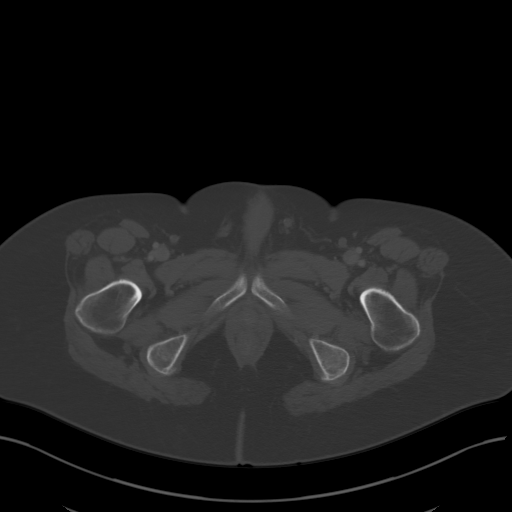
[im 14/98  soft-tissue]
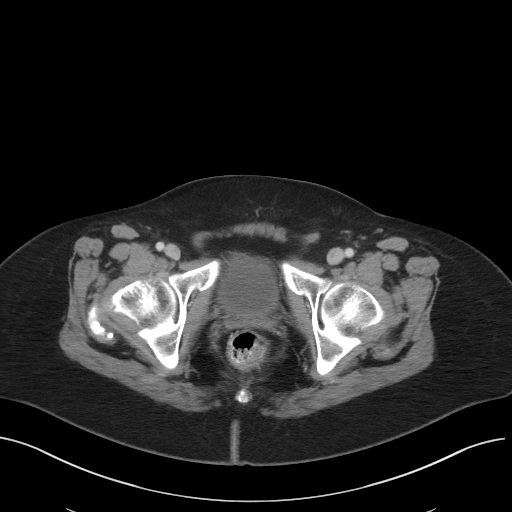
[im 21/98  soft-tissue]
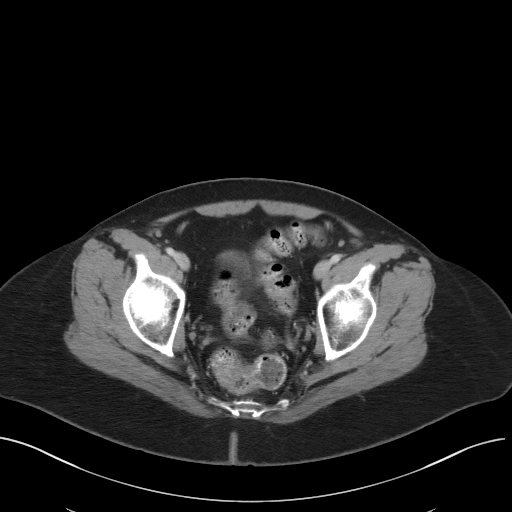
[im 28/98  soft-tissue]
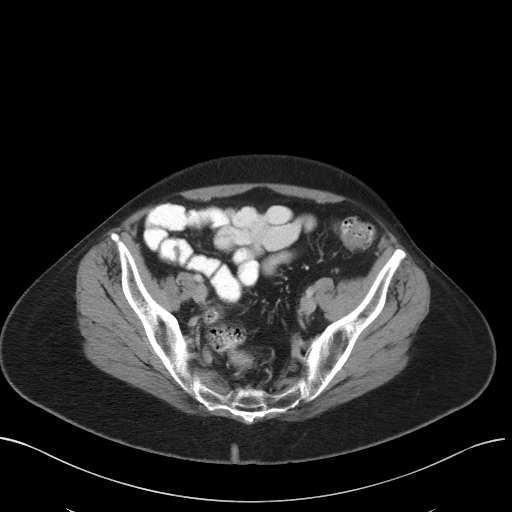
[im 35/98  soft-tissue]
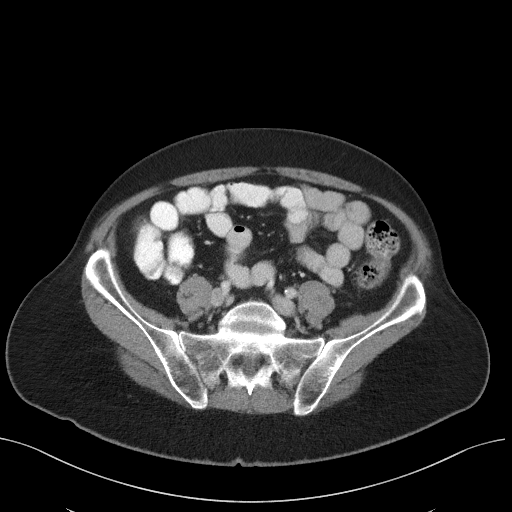
[im 42/98  soft-tissue]
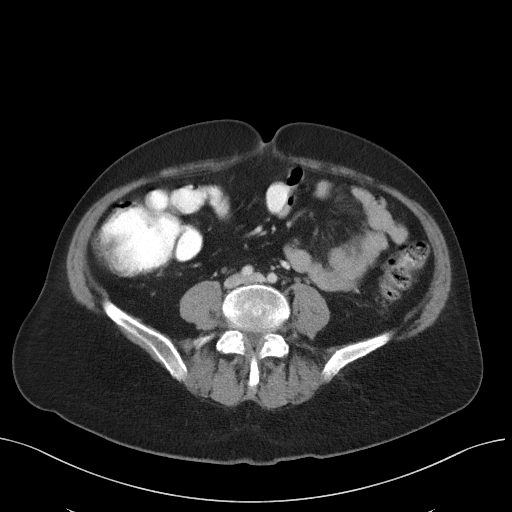
[im 49/98  soft-tissue]
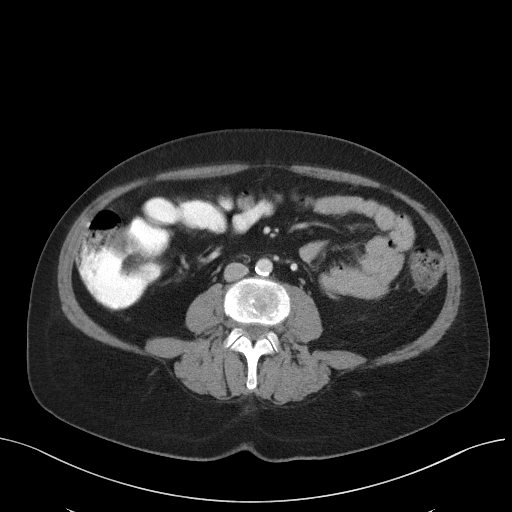
[im 56/98  soft-tissue]
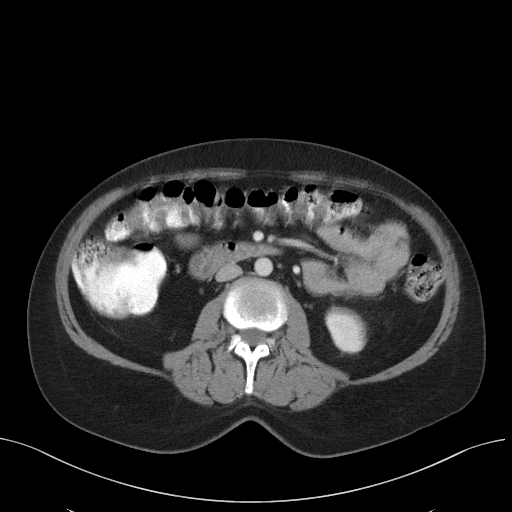
[im 63/98  soft-tissue]
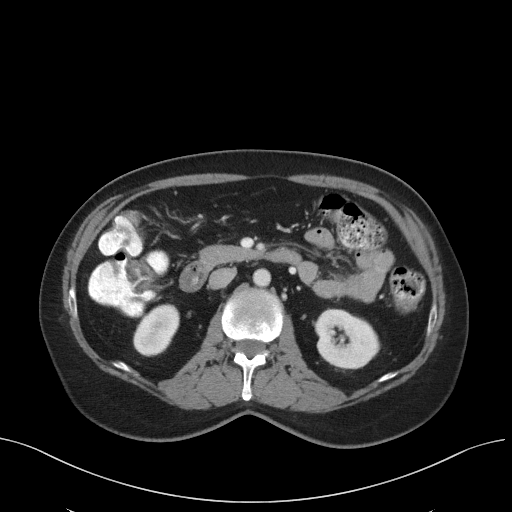
[im 63/98  bone]
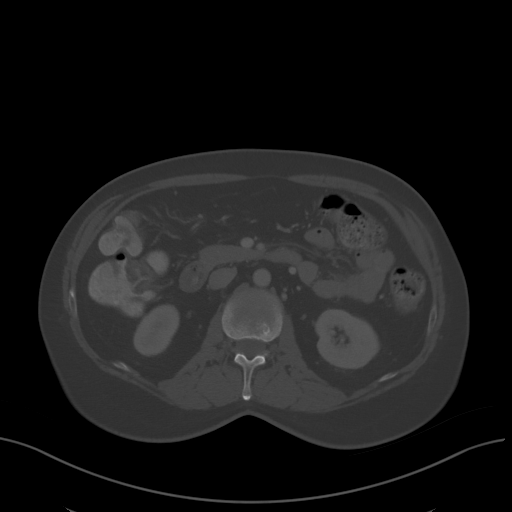
[im 70/98  soft-tissue]
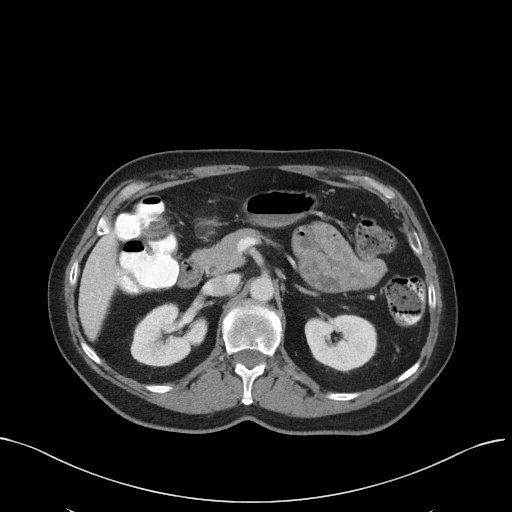
[im 77/98  soft-tissue]
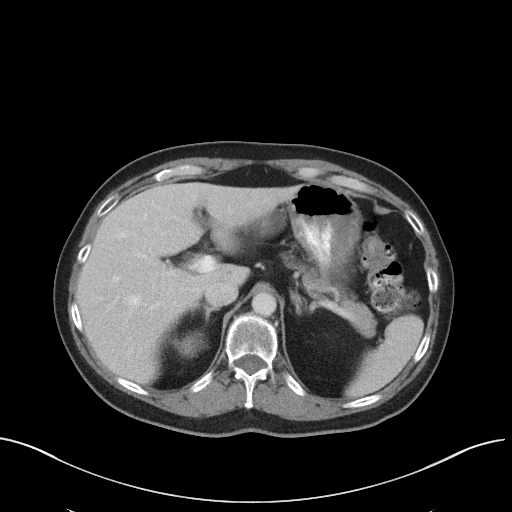
[im 84/98  soft-tissue]
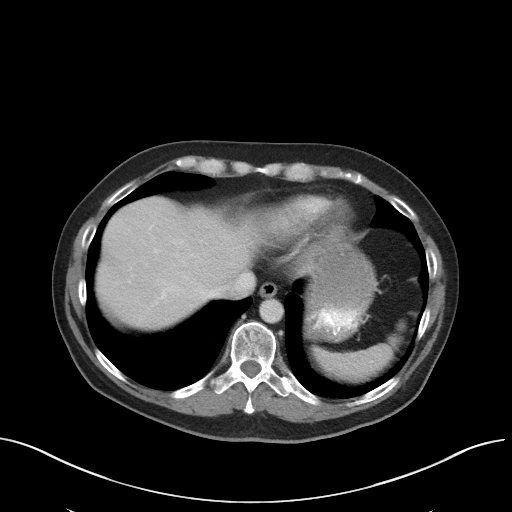
[im 91/98  soft-tissue]
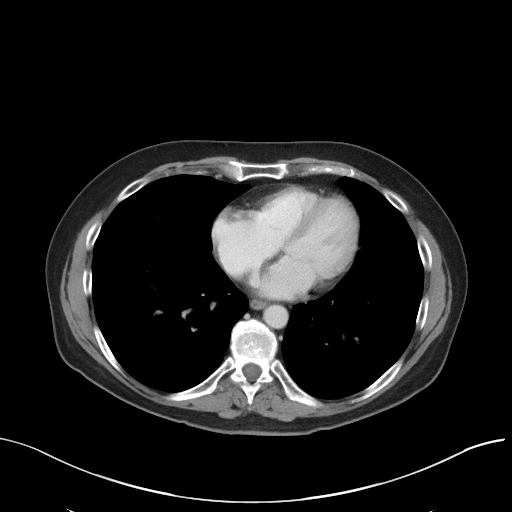

[Series 6: coronal st · coronal · 0.72mm/px · 3 of 89 slices shown]
[im 30/89  soft-tissue]
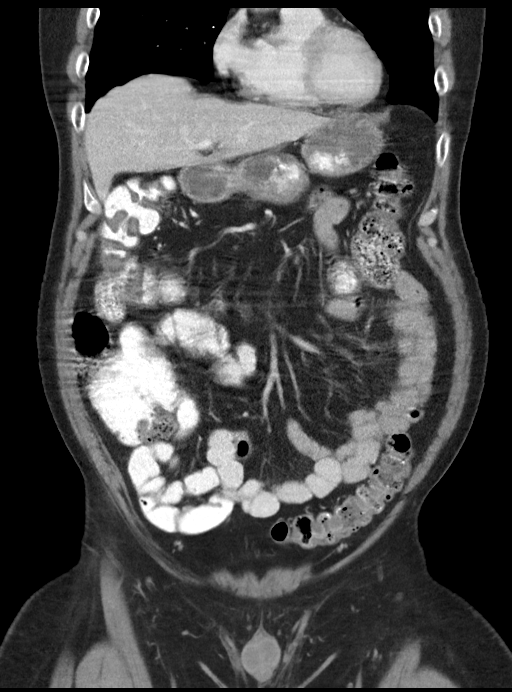
[im 40/89  soft-tissue]
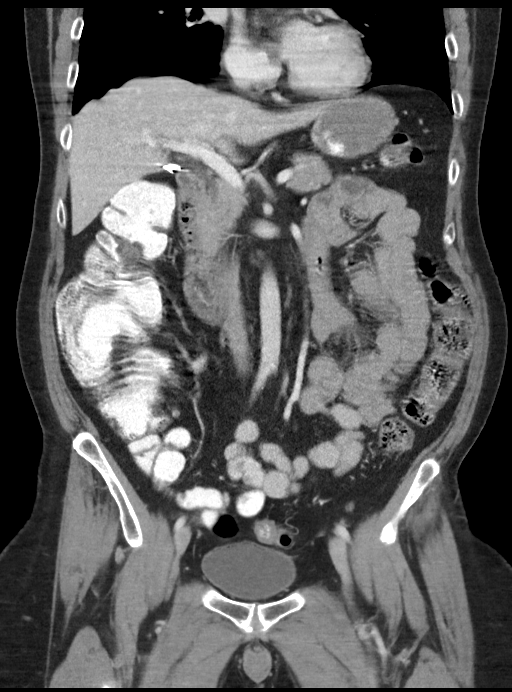
[im 49/89  soft-tissue]
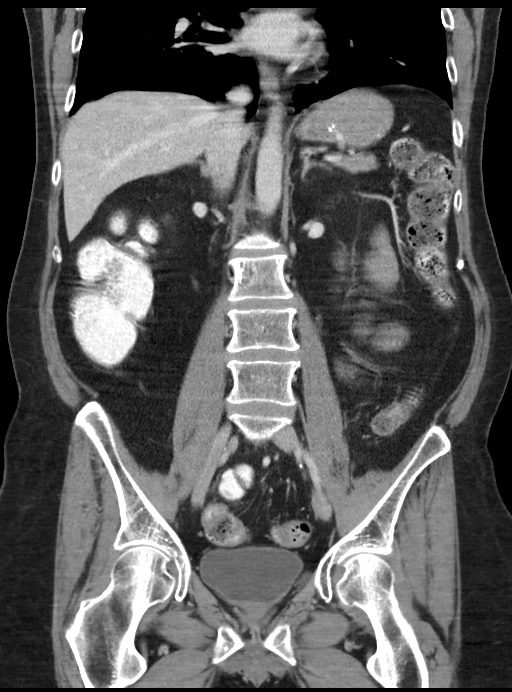

[16 of 46 positions shown; findings below may reference images not displayed]

FINDINGS: Lower chest: No acute abnormality.

Hepatobiliary: No focal liver abnormality is seen. Status post
cholecystectomy. No biliary dilatation.

Pancreas: Unremarkable. No pancreatic ductal dilatation or
surrounding inflammatory changes.

Spleen: Normal in size without significant abnormality.

Adrenals/Urinary Tract: Adrenal glands are unremarkable. Kidneys are
normal, without renal calculi, solid lesion, or hydronephrosis.
Bladder is unremarkable.

Stomach/Bowel: Stomach is within normal limits. Appendix appears
normal. No evidence of bowel wall thickening, distention, or
inflammatory changes. Moderate burden of stool throughout the colon.

Vascular/Lymphatic: Aortic atherosclerosis. No enlarged abdominal or
pelvic lymph nodes.

Reproductive: No mass or other significant abnormality.

Other: No abdominal wall hernia or abnormality. No abdominopelvic
ascites.

Musculoskeletal: No acute or significant osseous findings.
IMPRESSION: 1. No CT findings of the abdomen or pelvis to explain right-sided
abdominal pain.

2.  Status post cholecystectomy.

3. Aortic Atherosclerosis ([id number]).

## 2021-01-10 ENCOUNTER — Ambulatory Visit (INDEPENDENT_AMBULATORY_CARE_PROVIDER_SITE_OTHER): Payer: Medicare Other | Admitting: Family Medicine

## 2021-01-10 ENCOUNTER — Other Ambulatory Visit: Payer: Self-pay

## 2021-01-10 VITALS — BP 110/68 | HR 71 | Temp 96.0°F | Wt 172.4 lb

## 2021-01-10 DIAGNOSIS — R109 Unspecified abdominal pain: Secondary | ICD-10-CM

## 2021-01-10 DIAGNOSIS — G35 Multiple sclerosis: Secondary | ICD-10-CM | POA: Diagnosis not present

## 2021-01-10 DIAGNOSIS — Z86010 Personal history of colonic polyps: Secondary | ICD-10-CM | POA: Diagnosis not present

## 2021-01-10 NOTE — Progress Notes (Signed)
   Subjective:    Patient ID: [name] [name], male    DOB: 05-28-1965, 55 y.o.   MRN: [id number]  HPI He is here for consult concerning continued difficulty with right flank pain.  This is been going on for over 2 years.  He describes it as daily and since he gets worse with sedentary behavior.  No nausea, vomiting, diarrhea, urinary symptoms. Review of the record indicates need for follow-up colonoscopy.  Immunizations were discussed but he was not interested in COVID-vaccine.  Review of Systems     Objective:   Physical Exam Alert and in no distress.  Abdominal exam shows no masses or tenderness.  Heart and lung exam normal.       Assessment & Plan:  Multiple sclerosis ([place])  Right sided abdominal pain - Plan: CBC with Differential/Platelet, Comprehensive metabolic panel  History of adenomatous polyp of colon - Plan: Ambulatory referral to Gastroenterology I explained that since he has had the symptoms for over 2 years, it speaks against this being anything of any major significance.  I will do routine blood screening and refer back to GI.

## 2021-01-11 LAB — CBC WITH DIFFERENTIAL/PLATELET
Basophils Absolute: 0 10*3/uL (ref 0.0–0.2)
Basos: 0 %
EOS (ABSOLUTE): 0.1 10*3/uL (ref 0.0–0.4)
Eos: 2 %
Hematocrit: 41.7 % (ref 37.5–51.0)
Hemoglobin: 14 g/dL (ref 13.0–17.7)
Immature Grans (Abs): 0 10*3/uL (ref 0.0–0.1)
Immature Granulocytes: 0 %
Lymphocytes Absolute: 1.9 10*3/uL (ref 0.7–3.1)
Lymphs: 28 %
MCH: 28.7 pg (ref 26.6–33.0)
MCHC: 33.6 g/dL (ref 31.5–35.7)
MCV: 86 fL (ref 79–97)
Monocytes Absolute: 0.7 10*3/uL (ref 0.1–0.9)
Monocytes: 10 %
Neutrophils Absolute: 4 10*3/uL (ref 1.4–7.0)
Neutrophils: 60 %
Platelets: 276 10*3/uL (ref 150–450)
RBC: 4.87 x10E6/uL (ref 4.14–5.80)
RDW: 13.1 % (ref 11.6–15.4)
WBC: 6.7 10*3/uL (ref 3.4–10.8)

## 2021-01-11 LAB — COMPREHENSIVE METABOLIC PANEL
ALT: 15 IU/L (ref 0–44)
AST: 18 IU/L (ref 0–40)
Albumin/Globulin Ratio: 1.8 (ref 1.2–2.2)
Albumin: 4.8 g/dL (ref 3.8–4.9)
Alkaline Phosphatase: 52 IU/L (ref 44–121)
BUN/Creatinine Ratio: 19 (ref 9–20)
BUN: 15 mg/dL (ref 6–24)
Bilirubin Total: 0.4 mg/dL (ref 0.0–1.2)
CO2: 23 mmol/L (ref 20–29)
Calcium: 9.8 mg/dL (ref 8.7–10.2)
Chloride: 104 mmol/L (ref 96–106)
Creatinine, Ser: 0.79 mg/dL (ref 0.76–1.27)
Globulin, Total: 2.7 g/dL (ref 1.5–4.5)
Glucose: 93 mg/dL (ref 70–99)
Potassium: 4.9 mmol/L (ref 3.5–5.2)
Sodium: 142 mmol/L (ref 134–144)
Total Protein: 7.5 g/dL (ref 6.0–8.5)
eGFR: 104 mL/min/{1.73_m2} (ref >59)

## 2021-01-24 DIAGNOSIS — M792 Neuralgia and neuritis, unspecified: Secondary | ICD-10-CM | POA: Diagnosis not present

## 2021-01-24 DIAGNOSIS — Z79899 Other long term (current) drug therapy: Secondary | ICD-10-CM | POA: Diagnosis not present

## 2021-01-24 DIAGNOSIS — G35 Multiple sclerosis: Secondary | ICD-10-CM | POA: Diagnosis not present

## 2021-02-05 ENCOUNTER — Telehealth: Payer: Self-pay | Admitting: Family Medicine

## 2021-02-05 NOTE — Telephone Encounter (Signed)
Pt called and said he wants to have an X ray or ultrasound down because he is still having lower pain in his abdomin. He has not been scheduled with GI yet. But I gave him the number to call them

## 2021-02-06 NOTE — Telephone Encounter (Signed)
Left pt a Vm with this information

## 2021-02-08 ENCOUNTER — Encounter: Payer: Self-pay | Admitting: Gastroenterology

## 2021-02-22 ENCOUNTER — Encounter: Payer: Self-pay | Admitting: Family Medicine

## 2021-02-22 ENCOUNTER — Ambulatory Visit (INDEPENDENT_AMBULATORY_CARE_PROVIDER_SITE_OTHER): Payer: Medicare Other

## 2021-02-22 ENCOUNTER — Ambulatory Visit (INDEPENDENT_AMBULATORY_CARE_PROVIDER_SITE_OTHER): Payer: Medicare Other | Admitting: Family Medicine

## 2021-02-22 ENCOUNTER — Other Ambulatory Visit: Payer: Self-pay

## 2021-02-22 VITALS — BP 118/70 | HR 68 | Temp 97.9°F | Ht 70.5 in | Wt 169.8 lb

## 2021-02-22 VITALS — BP 118/70 | HR 68 | Temp 97.9°F | Ht 70.0 in | Wt 169.8 lb

## 2021-02-22 DIAGNOSIS — R109 Unspecified abdominal pain: Secondary | ICD-10-CM | POA: Diagnosis not present

## 2021-02-22 DIAGNOSIS — I700 Atherosclerosis of aorta: Secondary | ICD-10-CM

## 2021-02-22 DIAGNOSIS — G35 Multiple sclerosis: Secondary | ICD-10-CM | POA: Diagnosis not present

## 2021-02-22 DIAGNOSIS — Z0000 Encounter for general adult medical examination without abnormal findings: Secondary | ICD-10-CM

## 2021-02-22 NOTE — Patient Instructions (Signed)
[name] [name] , Thank you for taking time to come for your Medicare Wellness Visit. I appreciate your ongoing commitment to your health goals. Please review the following plan we discussed and let me know if I can assist you in the future.   Screening recommendations/referrals: Colonoscopy: appointment in December with GI Recommended yearly ophthalmology/optometry visit for glaucoma screening and checkup Recommended yearly dental visit for hygiene and checkup  Vaccinations: Influenza vaccine: decline Pneumococcal vaccine: decline Tdap vaccine: decline Shingles vaccine: discussed   Covid-19: decline  Advanced directives: Advance directive discussed with you today. I have provided a copy for you to complete at home and have notarized. Once this is complete please bring a copy in to our office so we can scan it into your chart.  Conditions/risks identified: none  Next appointment: Follow up in one year for your annual wellness visit   Preventive Care 40-64 Years, Male Preventive care refers to lifestyle choices and visits with your health care provider that can promote health and wellness. What does preventive care include? A yearly physical exam. This is also called an annual well check. Dental exams once or twice a year. Routine eye exams. Ask your health care provider how often you should have your eyes checked. Personal lifestyle choices, including: Daily care of your teeth and gums. Regular physical activity. Eating a healthy diet. Avoiding tobacco and drug use. Limiting alcohol use. Practicing safe sex. Taking low-dose aspirin every day starting at age 2. What happens during an annual well check? The services and screenings done by your health care provider during your annual well check will depend on your age, overall health, lifestyle risk factors, and family history of disease. Counseling  Your health care provider may ask you questions about your: Alcohol use. Tobacco  use. Drug use. Emotional well-being. Home and relationship well-being. Sexual activity. Eating habits. Work and work Statistician. Screening  You may have the following tests or measurements: Height, weight, and BMI. Blood pressure. Lipid and cholesterol levels. These may be checked every 5 years, or more frequently if you are over 15 years old. Skin check. Lung cancer screening. You may have this screening every year starting at age 71 if you have a 30-pack-year history of smoking and currently smoke or have quit within the past 15 years. Fecal occult blood test (FOBT) of the stool. You may have this test every year starting at age 42. Flexible sigmoidoscopy or colonoscopy. You may have a sigmoidoscopy every 5 years or a colonoscopy every 10 years starting at age 70. Prostate cancer screening. Recommendations will vary depending on your family history and other risks. Hepatitis C blood test. Hepatitis B blood test. Sexually transmitted disease (STD) testing. Diabetes screening. This is done by checking your blood sugar (glucose) after you have not eaten for a while (fasting). You may have this done every 1-3 years. Discuss your test results, treatment options, and if necessary, the need for more tests with your health care provider. Vaccines  Your health care provider may recommend certain vaccines, such as: Influenza vaccine. This is recommended every year. Tetanus, diphtheria, and acellular pertussis (Tdap, Td) vaccine. You may need a Td booster every 10 years. Zoster vaccine. You may need this after age 22. Pneumococcal 13-valent conjugate (PCV13) vaccine. You may need this if you have certain conditions and have not been vaccinated. Pneumococcal polysaccharide (PPSV23) vaccine. You may need one or two doses if you smoke cigarettes or if you have certain conditions. Talk to your health care provider  about which screenings and vaccines you need and how often you need them. This  information is not intended to replace advice given to you by your health care provider. Make sure you discuss any questions you have with your health care provider. Document Released: 04/27/2015 Document Revised: 12/19/2015 Document Reviewed: 01/30/2015 Elsevier Interactive Patient Education  2017 [place] Prevention in the Home Falls can cause injuries. They can happen to people of all ages. There are many things you can do to make your home safe and to help prevent falls. What can I do on the outside of my home? Regularly fix the edges of walkways and driveways and fix any cracks. Remove anything that might make you trip as you walk through a door, such as a raised step or threshold. Trim any bushes or trees on the path to your home. Use bright outdoor lighting. Clear any walking paths of anything that might make someone trip, such as rocks or tools. Regularly check to see if handrails are loose or broken. Make sure that both sides of any steps have handrails. Any raised decks and porches should have guardrails on the edges. Have any leaves, snow, or ice cleared regularly. Use sand or salt on walking paths during winter. Clean up any spills in your garage right away. This includes oil or grease spills. What can I do in the bathroom? Use night lights. Install grab bars by the toilet and in the tub and shower. Do not use towel bars as grab bars. Use non-skid mats or decals in the tub or shower. If you need to sit down in the shower, use a plastic, non-slip stool. Keep the floor dry. Clean up any water that spills on the floor as soon as it happens. Remove soap buildup in the tub or shower regularly. Attach bath mats securely with double-sided non-slip rug tape. Do not have throw rugs and other things on the floor that can make you trip. What can I do in the bedroom? Use night lights. Make sure that you have a light by your bed that is easy to reach. Do not use any sheets or  blankets that are too big for your bed. They should not hang down onto the floor. Have a firm chair that has side arms. You can use this for support while you get dressed. Do not have throw rugs and other things on the floor that can make you trip. What can I do in the kitchen? Clean up any spills right away. Avoid walking on wet floors. Keep items that you use a lot in easy-to-reach places. If you need to reach something above you, use a strong step stool that has a grab bar. Keep electrical cords out of the way. Do not use floor polish or wax that makes floors slippery. If you must use wax, use non-skid floor wax. Do not have throw rugs and other things on the floor that can make you trip. What can I do with my stairs? Do not leave any items on the stairs. Make sure that there are handrails on both sides of the stairs and use them. Fix handrails that are broken or loose. Make sure that handrails are as long as the stairways. Check any carpeting to make sure that it is firmly attached to the stairs. Fix any carpet that is loose or worn. Avoid having throw rugs at the top or bottom of the stairs. If you do have throw rugs, attach them to the floor  with carpet tape. Make sure that you have a light switch at the top of the stairs and the bottom of the stairs. If you do not have them, ask someone to add them for you. What else can I do to help prevent falls? Wear shoes that: Do not have high heels. Have rubber bottoms. Are comfortable and fit you well. Are closed at the toe. Do not wear sandals. If you use a stepladder: Make sure that it is fully opened. Do not climb a closed stepladder. Make sure that both sides of the stepladder are locked into place. Ask someone to hold it for you, if possible. Clearly mark and make sure that you can see: Any grab bars or handrails. First and last steps. Where the edge of each step is. Use tools that help you move around (mobility aids) if they are  needed. These include: Canes. Walkers. Scooters. Crutches. Turn on the lights when you go into a dark area. Replace any light bulbs as soon as they burn out. Set up your furniture so you have a clear path. Avoid moving your furniture around. If any of your floors are uneven, fix them. If there are any pets around you, be aware of where they are. Review your medicines with your doctor. Some medicines can make you feel dizzy. This can increase your chance of falling. Ask your doctor what other things that you can do to help prevent falls. This information is not intended to replace advice given to you by your health care provider. Make sure you discuss any questions you have with your health care provider. Document Released: 01/25/2009 Document Revised: 09/06/2015 Document Reviewed: 05/05/2014 Elsevier Interactive Patient Education  2017 [employer].

## 2021-02-22 NOTE — Progress Notes (Signed)
This visit occurred during the SARS-CoV-2 public health emergency.  Safety protocols were in place, including screening questions prior to the visit, additional usage of staff PPE, and extensive cleaning of exam room while observing appropriate contact time as indicated for disinfecting solutions.  Subjective:   [name] [name] is a 55 y.o. male who presents for Medicare Annual/Subsequent preventive examination.  Review of Systems     Cardiac Risk Factors include: advanced age (>38men, >22 women);male gender     Objective:    Today's Vitals   02/22/21 1331 02/22/21 1337  BP: 118/70   Pulse: 68   Temp: 97.9 F (36.6 C)   TempSrc: Oral   SpO2: 97%   Weight: 169 lb 12.8 oz (77 kg)   Height: 5\' 10"  (1.778 m)   PainSc:  3    Body mass index is 24.36 kg/m.  Advanced Directives 02/22/2021 07/16/2018 06/26/2015 01/05/2014 12/22/2013 11/03/2012  Does Patient Have a Medical Advance Directive? No No Yes No No Patient does not have advance directive  Type of Advance Directive - - [place];Living will - - -  Copy of [place] in Chart? - - No - copy requested - - -  Would patient like information on creating a medical advance directive? Yes (MAU/Ambulatory/Procedural Areas - Information given) Yes (MAU/Ambulatory/Procedural Areas - Information given) - - - -    Current Medications (verified) Outpatient Encounter Medications as of 02/22/2021  Medication Sig   cyclobenzaprine (FLEXERIL) 10 MG tablet Take 10 mg by mouth 2 (two) times daily as needed.   EPINEPHrine 0.3 mg/0.3 mL IJ SOAJ injection    Multiple Vitamin (MULTIVITAMIN) tablet Take 1 tablet by mouth daily.   amphetamine-dextroamphetamine (ADDERALL) 20 MG tablet Take 1 tablet (20 mg total) by mouth daily. (Patient not taking: No sig reported)   levocetirizine (XYZAL) 5 MG tablet TAKE 1 TABLET BY MOUTH EVERY DAY IN THE EVENING (Patient not taking: No sig reported)   mometasone (NASONEX) 50 MCG/ACT  nasal spray Place into the nose. (Patient not taking: No sig reported)   Olopatadine HCl (PAZEO) 0.7 % SOLN Place 1 drop into both eyes 1 day or 1 dose. (Patient not taking: No sig reported)   sildenafil (VIAGRA) 100 MG tablet Take 1 tablet (100 mg total) by mouth daily as needed for erectile dysfunction. (Patient not taking: No sig reported)   No facility-administered encounter medications on file as of 02/22/2021.    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    Other postprocedural status(V45.89)    inguinal herniorrhaphies bilateral    Sleep apnea    cpap   Testosterone deficiency 11/04/11   Past Surgical History:  Procedure Laterality Date   HERNIA REPAIR Bilateral 1971   LAPAROSCOPIC CHOLECYSTECTOMY  2014   Family History  Problem Relation Age of Onset   Arthritis Mother    Colon cancer Neg Hx    Esophageal cancer Neg Hx    Rectal cancer Neg Hx    Stomach cancer Neg Hx    Allergic rhinitis Neg Hx    Angioedema Neg Hx    Asthma Neg Hx    Immunodeficiency Neg Hx    Urticaria Neg Hx    Eczema Neg  Hx    Atopy Neg Hx    Social History   Socioeconomic History   Marital status: Divorced    Spouse name: Not on file   Number of children: 0   Years of education: Not on file   Highest education level: Not on file  Occupational History   Occupation: retired/disabilty  Tobacco Use   Smoking status: Former    Types: Cigarettes    Quit date: 07/14/2010    Years since quitting: 10.6   Smokeless tobacco: Never  Vaping Use   Vaping Use: Never used  Substance and Sexual Activity   Alcohol use: No   Drug use: No   Sexual activity: Not Currently  Other Topics Concern   Not on file  Social History Narrative   Not on file    Social Determinants of Health   Financial Resource Strain: Low Risk    Difficulty of Paying Living Expenses: Not hard at all  Food Insecurity: No Food Insecurity   Worried About Charity fundraiser in the Last Year: Never true   [place] in the Last Year: Never true  Transportation Needs: No Transportation Needs   Lack of Transportation (Medical): No   Lack of Transportation (Non-Medical): No  Physical Activity: Insufficiently Active   Days of Exercise per Week: 7 days   Minutes of Exercise per Session: 20 min  Stress: No Stress Concern Present   Feeling of Stress : Not at all  Social Connections: Not on file    Tobacco Counseling Counseling given: Not Answered   Clinical Intake:  Pre-visit preparation completed: Yes  Pain : 0-10 Pain Score: 3  Pain Type: Chronic pain Pain Location: Hip Pain Orientation: Right Pain Descriptors / Indicators: Sharp (stiffness) Pain Onset: More than a month ago Pain Frequency: Constant     Nutritional Status: BMI of 19-24  Normal Nutritional Risks: None Diabetes: No  How often do you need to have someone help you when you read instructions, pamphlets, or other written materials from your doctor or pharmacy?: 1 - Never What is the last grade level you completed in school?: associates degree  Diabetic? no  Interpreter Needed?: No  Information entered by :: NAllen LPN   Activities of Daily Living In your present state of health, do you have any difficulty performing the following activities: 02/22/2021  Hearing? N  Vision? N  Difficulty concentrating or making decisions? N  Walking or climbing stairs? Y  Dressing or bathing? N  Doing errands, shopping? N  Preparing Food and eating ? N  Using the Toilet? N  In the past six months, have you accidently leaked urine? N  Do you have problems with loss of bowel control? N  Managing your Medications? N  Managing your Finances? N  Housekeeping or managing your  Housekeeping? N  Some recent data might be hidden    Patient Care Team: Denita Lung, MD as PCP - General (Family Medicine)  Indicate any recent Medical Services you may have received from other than [facility] providers in the past year (date may be approximate).     Assessment:   This is a routine wellness examination for [name] [name].  Hearing/Vision screen Vision Screening - Comments:: No regular eye exams,   Dietary issues and exercise activities discussed: Current Exercise Habits: Home exercise routine, Time (Minutes): 20, Frequency (Times/Week): 7, Weekly Exercise (Minutes/Week): 140   Goals Addressed             This Visit's Progress  Patient Stated       02/22/2021, wants to find out about hip pain       Depression Screen PHQ 2/9 Scores 02/22/2021 07/16/2018 06/26/2015 10/21/2013  PHQ - 2 Score 0 0 0 2    Fall Risk Fall Risk  02/22/2021 07/16/2018 06/26/2015  Falls in the past year? 0 0 No  Risk for fall due to : Impaired mobility;Impaired balance/gait - -  Follow up Falls evaluation completed;Education provided;Falls prevention discussed - -    FALL RISK PREVENTION PERTAINING TO THE HOME:  Any stairs in or around the home? Yes  If so, are there any without handrails? No  Home free of loose throw rugs in walkways, pet beds, electrical cords, etc? Yes  Adequate lighting in your home to reduce risk of falls? Yes   ASSISTIVE DEVICES UTILIZED TO PREVENT FALLS:  Life alert? No  Use of a cane, walker or w/c? Yes  Grab bars in the bathroom? No  Shower chair or bench in shower? No  Elevated toilet seat or a handicapped toilet? No   TIMED UP AND GO:  Was the test performed? No .   Gait slow and steady with assistive device   Cognitive Function:     6CIT Screen 02/22/2021  What Year? 0 points  What month? 0 points  What time? 0 points  Count back from 20 0 points  Months in reverse 0 points  Repeat phrase 2 points  Total Score 2     Immunizations Immunization History  Administered Date(s) Administered   Influenza,inj,Quad PF,6+ Mos 12/15/2018   Tdap 04/24/2010    TDAP status: Due, Education has been provided regarding the importance of this vaccine. Advised may receive this vaccine at local pharmacy or Health Dept. Aware to provide a copy of the vaccination record if obtained from local pharmacy or Health Dept. Verbalized acceptance and understanding.  Flu Vaccine status: Declined, Education has been provided regarding the importance of this vaccine but patient still declined. Advised may receive this vaccine at local pharmacy or Health Dept. Aware to provide a copy of the vaccination record if obtained from local pharmacy or Health Dept. Verbalized acceptance and understanding.  Pneumococcal vaccine status: Declined,  Education has been provided regarding the importance of this vaccine but patient still declined. Advised may receive this vaccine at local pharmacy or Health Dept. Aware to provide a copy of the vaccination record if obtained from local pharmacy or Health Dept. Verbalized acceptance and understanding.   Covid-19 vaccine status: Declined, Education has been provided regarding the importance of this vaccine but patient still declined. Advised may receive this vaccine at local pharmacy or Health Dept.or vaccine clinic. Aware to provide a copy of the vaccination record if obtained from local pharmacy or Health Dept. Verbalized acceptance and understanding.  Qualifies for Shingles Vaccine? Yes   Zostavax completed No   Shingrix Completed?: No.    Education has been provided regarding the importance of this vaccine. Patient has been advised to call insurance company to determine out of pocket expense if they have not yet received this vaccine. Advised may also receive vaccine at local pharmacy or Health Dept. Verbalized acceptance and understanding.  Screening Tests Health Maintenance  Topic Date Due    COVID-19 Vaccine (1) Never done   Pneumococcal Vaccine 24-33 Years old (1 - PCV) Never done   Hepatitis C Screening  Never done   COLONOSCOPY (Pts 45-72yrs Insurance coverage will need to be confirmed)  01/29/2020   TETANUS/TDAP  03/20/2021 (Originally 04/24/2020)   Zoster Vaccines- Shingrix (1 of 2) 03/20/2021 (Originally 09/22/1982)   INFLUENZA VACCINE  07/12/2021 (Originally 11/12/2020)   HIV Screening  01/10/2022 (Originally 09/22/1978)   HPV VACCINES  Aged Out    Health Maintenance  Health Maintenance Due  Topic Date Due   COVID-19 Vaccine (1) Never done   Pneumococcal Vaccine 36-57 Years old (1 - PCV) Never done   Hepatitis C Screening  Never done   COLONOSCOPY (Pts 45-90yrs Insurance coverage will need to be confirmed)  01/29/2020    Colorectal cancer screening: Type of screening: Colonoscopy. Completed 01/29/2015. Repeat every 5 years  Lung Cancer Screening: (Low Dose CT Chest recommended if Age 66-80 years, 30 pack-year currently smoking OR have quit w/in 15years.) does not qualify.   Lung Cancer Screening Referral: no  Additional Screening:  Hepatitis C Screening: does qualify;   Vision Screening: Recommended annual ophthalmology exams for early detection of glaucoma and other disorders of the eye. Is the patient up to date with their annual eye exam?  No  Who is the provider or what is the name of the office in which the patient attends annual eye exams? none If pt is not established with a provider, would they like to be referred to a provider to establish care? No .   Dental Screening: Recommended annual dental exams for proper oral hygiene  Community Resource Referral / Chronic Care Management: CRR required this visit?  No   CCM required this visit?  No      Plan:     I have personally reviewed and noted the following in the patient's chart:   Medical and social history Use of alcohol, tobacco or illicit drugs  Current medications and supplements including  opioid prescriptions. Patient is not currently taking opioid prescriptions. Functional ability and status Nutritional status Physical activity Advanced directives List of other physicians Hospitalizations, surgeries, and ER visits in previous 12 months Vitals Screenings to include cognitive, depression, and falls Referrals and appointments  In addition, I have reviewed and discussed with patient certain preventive protocols, quality metrics, and best practice recommendations. A written personalized care plan for preventive services as well as general preventive health recommendations were provided to patient.     Kellie Simmering, LPN   [id number]   Nurse Notes: none

## 2021-02-22 NOTE — Progress Notes (Signed)
   Subjective:    Patient ID: [name] [name], male    DOB: 1965/05/04, 55 y.o.   MRN: [id number]  HPI He is here because of continued difficulty with right-sided pain.  He now describes pain differently than he did in the past.  He blames it on the fall that he had several years ago because a lot of bruising in his hip area.  He describes the pain in the abdominal area is sharp and intermittent in nature mainly in the right lower quadrant that radiates into the back.  He then talks about 5 discomfort but motion has no effect on this.  He did have a CT scan of his abdomen and pelvis in 2020 which was negative except for aortic atherosclerosis.  He also was seen at [facility] for his MS and apparently they did an MRI of his spine and found no lesions.   Review of Systems     Objective:   Physical Exam Alert and in no distress otherwise not examined       Assessment & Plan:  Right sided abdominal pain  Aortic atherosclerosis (HCC)  Multiple sclerosis ([place]) I explained that he has having symptoms that are not specific for anything in his thigh or abdomen.  I explained that the MRI did eliminate anything in his spine and the CT abdomen and pelvis was really negative except for the aortic atherosclerosis.  Mention the need for a statin drug and he is not interested in that.  Suggested getting involved in a regular exercise and physical therapy program also consider having acupuncture done to see if that will help.

## 2021-03-21 ENCOUNTER — Encounter: Payer: Self-pay | Admitting: Gastroenterology

## 2021-03-21 ENCOUNTER — Ambulatory Visit: Payer: Medicare Other | Admitting: Gastroenterology

## 2021-03-21 VITALS — BP 120/60 | HR 68 | Ht 70.5 in | Wt 168.0 lb

## 2021-03-21 DIAGNOSIS — G8929 Other chronic pain: Secondary | ICD-10-CM | POA: Diagnosis not present

## 2021-03-21 DIAGNOSIS — Z86010 Personal history of colonic polyps: Secondary | ICD-10-CM | POA: Diagnosis not present

## 2021-03-21 DIAGNOSIS — R1031 Right lower quadrant pain: Secondary | ICD-10-CM

## 2021-03-21 DIAGNOSIS — R109 Unspecified abdominal pain: Secondary | ICD-10-CM

## 2021-03-21 NOTE — Patient Instructions (Signed)
If you are age 57 or younger, your body mass index should be between 19-25. Your Body mass index is 23.76 kg/m. If this is out of the aformentioned range listed, please consider follow up with your Primary Care Provider.  ________________________________________________________  The [facility] providers would like to encourage you to use [facility] to communicate with providers for non-urgent requests or questions.  Due to long hold times on the telephone, sending your provider a message by [facility] may be a faster and more efficient way to get a response.  Please allow 48 business hours for a response.  Please remember that this is for non-urgent requests.  _______________________________________________________  [name] [name] have been scheduled for a colonoscopy. Please follow written instructions given to you at your visit today.  Please pick up your prep supplies at the pharmacy within the next 1-3 days. If you use inhalers (even only as needed), please bring them with you on the day of your procedure.  You have been scheduled for a CT scan of the abdomen and pelvis at [facility] ([address]---this is in the same building as [employer]).   You are scheduled on 04/04/2021 at 8:30 am. You should arrive 15 minutes prior to your appointment time for registration. Please follow the written instructions below on the day of your exam:  WARNING: IF YOU ARE ALLERGIC TO IODINE/X-RAY DYE, PLEASE NOTIFY RADIOLOGY IMMEDIATELY AT [phone]! YOU WILL BE GIVEN A 13 HOUR PREMEDICATION PREP.  1) Do not eat anything after 4:30 am (4 hours prior to your test) 2) You have been given 2 bottles of oral contrast to drink. The solution may taste better if refrigerated, but do NOT add ice or any other liquid to this solution. Shake well before drinking.    Drink 1 bottle of contrast @ 6:30 am (2 hours prior to your exam)  Drink 1 bottle of contrast @ 7:30 am (1 hour prior to your exam)  You may  take any medications as prescribed with a small amount of water, if necessary. If you take any of the following medications: METFORMIN, GLUCOPHAGE, GLUCOVANCE, AVANDAMET, RIOMET, FORTAMET, [place] MET, JANUMET, GLUMETZA or METAGLIP, you MAY be asked to HOLD this medication 48 hours AFTER the exam.  The purpose of you drinking the oral contrast is to aid in the visualization of your intestinal tract. The contrast solution may cause some diarrhea. Depending on your individual set of symptoms, you may also receive an intravenous injection of x-ray contrast/dye. Plan on being at [facility] for 30 minutes or longer, depending on the type of exam you are having performed.  This test typically takes 30-45 minutes to complete.  If you have any questions regarding your exam or if you need to reschedule, you may call the CT department at [phone] between the hours of 8:00 am and 5:00 pm, Monday-Friday.  ________________________________________________________________________   Follow up pending the results of you Ct and Colonoscopy  Thank you for entrusting me with your care and choosing [facility].  Dr. Fuller Plan

## 2021-03-21 NOTE — Progress Notes (Signed)
    History of Present Illness: This is a 55 year old male with right sided abdominal pain, right flank pain and a personal history of sessile serrated colon polyps.  He was evaluated in our office by Alonza Bogus, PA-C on February 04, 2019 for similar symptoms. CT AP on February 10, 2019 showed no findings to explain right-sided abdominal pain, moderate amount of stool in the colon, status post cholecystectomy and aortic atherosclerosis.  He relates this pain has been intermittently present  since a fall 2 years ago. The pain does not changes with meals, bowel movements, movement, bending. He underwent colonoscopy here in September 2015 showing 2 SSP's and internal hemorrhoids.  He underwent colonoscopy at [facility] in October 2016 for evaluation of diarrhea which was normal. CBC, CMP in September 2022 were normal. Denies weight loss, constipation, diarrhea, change in stool caliber, melena, hematochezia, nausea, vomiting, dysphagia, reflux symptoms, chest pain.   Current Medications, Allergies, Past Medical History, Past Surgical History, Family History and Social History were reviewed in [employer] record.   Physical Exam: General: Well developed, well nourished, no acute distress Head: Normocephalic and atraumatic Eyes: Sclerae anicteric, EOMI Ears: Normal auditory acuity Mouth: Not examined, mask on during Covid-19 pandemic Lungs: Clear throughout to auscultation Heart: Regular rate and rhythm; no murmurs, rubs or bruits Abdomen: Soft, non tender and non distended. No masses, hepatosplenomegaly or hernias noted. Normal Bowel sounds Rectal: Not done Musculoskeletal: Symmetrical with no gross deformities  Pulses:  Normal pulses noted Extremities: No clubbing, cyanosis, edema or deformities noted Neurological: Alert oriented x 4, grossly nonfocal Psychological:  Alert and cooperative. Normal mood and affect   Assessment and Recommendations:  Personal history of sessile  serrated colon polyps in 2015.  He is due for surveillance colonoscopy.  Schedule colonoscopy. The risks (including bleeding, perforation, infection, missed lesions, medication reactions and possible hospitalization or surgery if complications occur), benefits, and alternatives to colonoscopy with possible biopsy and possible polypectomy were discussed with the patient and they consent to proceed.   Intermittent right flank and right sided abdominal pain for 2 years.  Etiology unclear.  Unlikely to be gastrointestinal.  Schedule CT AP to further evaluate. Colonoscopy as above.  Multiple sclerosis followed at [facility].

## 2021-03-26 ENCOUNTER — Telehealth: Payer: Self-pay | Admitting: Gastroenterology

## 2021-03-26 NOTE — Telephone Encounter (Signed)
Good Afternoon Dr. Fuller Plan,   Patient called to reschedule his procedure on 12/15 at 1:30 due to not having transportation.  Patient was rescheduled on 1/11 at 4:00pm

## 2021-03-28 ENCOUNTER — Encounter: Payer: Medicare Other | Admitting: Gastroenterology

## 2021-04-04 ENCOUNTER — Other Ambulatory Visit: Payer: Self-pay

## 2021-04-04 ENCOUNTER — Ambulatory Visit (INDEPENDENT_AMBULATORY_CARE_PROVIDER_SITE_OTHER)
Admission: RE | Admit: 2021-04-04 | Discharge: 2021-04-04 | Disposition: A | Discharge status: Home / Self Care | Payer: Medicare Other | Source: Ambulatory Visit | Attending: Gastroenterology | Admitting: Gastroenterology

## 2021-04-04 DIAGNOSIS — G8929 Other chronic pain: Secondary | ICD-10-CM

## 2021-04-04 DIAGNOSIS — R109 Unspecified abdominal pain: Secondary | ICD-10-CM

## 2021-04-04 DIAGNOSIS — R1031 Right lower quadrant pain: Secondary | ICD-10-CM | POA: Diagnosis not present

## 2021-04-04 DIAGNOSIS — K769 Liver disease, unspecified: Secondary | ICD-10-CM | POA: Diagnosis not present

## 2021-04-04 MED ORDER — IOHEXOL 300 MG/ML  SOLN
100.0000 mL | Freq: Once | INTRAMUSCULAR | Status: AC | PRN
  Administered 2021-04-04: 09:00:00 100 mL via INTRAVENOUS

## 2021-04-19 ENCOUNTER — Ambulatory Visit (INDEPENDENT_AMBULATORY_CARE_PROVIDER_SITE_OTHER): Payer: Medicare Other | Admitting: Medical

## 2021-04-19 ENCOUNTER — Other Ambulatory Visit: Payer: Self-pay

## 2021-04-19 ENCOUNTER — Ambulatory Visit: Payer: Medicare Other | Admitting: Family Medicine

## 2021-04-19 VITALS — BP 124/78 | HR 78 | Temp 97.4°F | Wt 173.6 lb

## 2021-04-19 DIAGNOSIS — B359 Dermatophytosis, unspecified: Secondary | ICD-10-CM | POA: Diagnosis not present

## 2021-04-19 DIAGNOSIS — R21 Rash and other nonspecific skin eruption: Secondary | ICD-10-CM

## 2021-04-19 MED ORDER — CLOTRIMAZOLE-BETAMETHASONE 1-0.05 % EX CREA: 1.0000 "application " | TOPICAL_CREAM | Freq: Every day | CUTANEOUS | 0 refills | Status: AC

## 2021-04-19 NOTE — Progress Notes (Signed)
Subjective:  [name] [name] is a 56 y.o. male who presents for Chief Complaint  Patient presents with   rash    Rash, comes and goes. Been there for a couple weeks. Irritated      Here for rash, intermittent.  First saw it 3-4 weeks ago, but it may have been there longer.  Occasionally itchy.  No skin exposures, no new chemical exposure.   Hasn't tried treatment.  No other aggravating or relieving factors.    No other c/o.   The following portions of the patient's history were reviewed and updated as appropriate: allergies, current medications, past family history, past medical history, past social history, past surgical history and problem list.  ROS Otherwise as in subjective above  Objective: BP 124/78    Pulse 78    Temp (!) 97.4 F (36.3 C)    Wt 173 lb 9.6 oz (78.7 kg)    BMI 24.56 kg/m   General appearance: alert, no distress, well developed, well nourished Right distal lateral right thigh just proximal to knee with 8cm diameter pinkish rash with some rough patches of skin and some slight clearing centrallly suggestive of tinea   Assessment: Encounter Diagnoses  Name Primary?   Rash Yes   Ringworm      Plan: Discussed skin findings.  Most suggestive of ringworm.  Begin medication below.  If not resolved within 2 to 2-1/2 weeks then call back.  Discussed means of transmission.  Discussed hand hygiene.  [name] [name] was seen today for rash.  Diagnoses and all orders for this visit:  Rash  Ringworm  Other orders -     clotrimazole-betamethasone (LOTRISONE) cream; Apply 1 application topically daily.   Follow up: prn

## 2021-04-24 ENCOUNTER — Encounter: Payer: Self-pay | Admitting: Gastroenterology

## 2021-04-24 ENCOUNTER — Ambulatory Visit (AMBULATORY_SURGERY_CENTER): Payer: Medicare Other | Admitting: Gastroenterology

## 2021-04-24 ENCOUNTER — Other Ambulatory Visit: Payer: Self-pay

## 2021-04-24 VITALS — BP 108/68 | HR 76 | Temp 98.0°F | Resp 17 | Ht 70.0 in | Wt 168.0 lb

## 2021-04-24 DIAGNOSIS — D124 Benign neoplasm of descending colon: Secondary | ICD-10-CM

## 2021-04-24 DIAGNOSIS — K621 Rectal polyp: Secondary | ICD-10-CM

## 2021-04-24 DIAGNOSIS — D128 Benign neoplasm of rectum: Secondary | ICD-10-CM

## 2021-04-24 DIAGNOSIS — Z860100 Personal history of colon polyps, unspecified: Secondary | ICD-10-CM

## 2021-04-24 DIAGNOSIS — K635 Polyp of colon: Secondary | ICD-10-CM | POA: Diagnosis not present

## 2021-04-24 DIAGNOSIS — D123 Benign neoplasm of transverse colon: Secondary | ICD-10-CM

## 2021-04-24 MED ORDER — SODIUM CHLORIDE 0.9 % IV SOLN: 500.0000 mL | INTRAVENOUS | Status: DC

## 2021-04-24 NOTE — Progress Notes (Signed)
Pt's states no medical or surgical changes since previsit or office visit. 

## 2021-04-24 NOTE — Op Note (Signed)
[facility] Patient Name: [name] [name] Procedure Date: 04/24/2021 2:36 PM MRN: [id number] Endoscopist: Ladene Artist , MD Age: 56 Referring MD:  Date of Birth: 1965-10-23 Gender: Male Account #: [id number] Procedure:                Colonoscopy Indications:              High risk colon cancer surveillance: Personal                            history of sessile serrated colon polyp (10 mm or                            greater in size) Medicines:                Monitored Anesthesia Care Procedure:                Pre-Anesthesia Assessment:                           - Prior to the procedure, a History and Physical                            was performed, and patient medications and                            allergies were reviewed. The patient's tolerance of                            previous anesthesia was also reviewed. The risks                            and benefits of the procedure and the sedation                            options and risks were discussed with the patient.                            All questions were answered, and informed consent                            was obtained. Prior Anticoagulants: The patient has                            taken no previous anticoagulant or antiplatelet                            agents. ASA Grade Assessment: III - A patient with                            severe systemic disease. After reviewing the risks                            and benefits, the patient was deemed in  satisfactory condition to undergo the procedure.                           After obtaining informed consent, the colonoscope                            was passed under direct vision. Throughout the                            procedure, the patient's blood pressure, pulse, and                            oxygen saturations were monitored continuously. The                            Olympus Colonoscope #[id number] was introduced  through                            the anus and advanced to the the cecum, identified                            by appendiceal orifice and ileocecal valve. The                            ileocecal valve, appendiceal orifice, and rectum                            were photographed. The quality of the bowel                            preparation was good. The colonoscopy was somewhat                            difficult due to significant looping and a tortuous                            colon. The patient tolerated the procedure well. Scope In: 3:05:27 PM Scope Out: 3:35:25 PM Scope Withdrawal Time: 0 hours 23 minutes 43 seconds  Total Procedure Duration: 0 hours 29 minutes 58 seconds  Findings:                 The perianal and digital rectal examinations were                            normal.                           A 14 mm polyp was found in the hepatic flexure. The                            polyp was sessile. The polyp was removed with a                            cold snare. Resection and retrieval were complete.  Persistent oozing post polypectomy. For hemostasis,                            two hemostatic clips were successfully placed (MR                            conditional). There was no bleeding at the end of                            the procedure.                           A 10 mm polyp was found in the descending colon.                            The polyp was sessile. The polyp was removed with a                            cold snare. Resection and retrieval were complete.                           Two sessile polyps were found in the rectum. The                            polyps were 5 to 6 mm in size. These polyps were                            removed with a cold snare. Resection and retrieval                            were complete.                           A few small-mouthed diverticula were found in the                             left colon. There was no evidence of diverticular                            bleeding.                           Internal hemorrhoids were found during                            retroflexion. The hemorrhoids were small and Grade                            I (internal hemorrhoids that do not prolapse).                           The exam was otherwise without abnormality on  direct and retroflexion views. Complications:            No immediate complications. Estimated blood loss:                            None. Estimated Blood Loss:     Estimated blood loss: none. Impression:               - One 14 mm polyp at the hepatic flexure, removed                            with a cold snare. Resected and retrieved. Clips                            (MR conditional) were placed.                           - One 10 mm polyp in the descending colon, removed                            with a cold snare. Resected and retrieved.                           - Two 5 to 6 mm polyps in the rectum, removed with                            a cold snare. Resected and retrieved.                           - Mild diverticulosis in the left colon.                           - Internal hemorrhoids.                           - The examination was otherwise normal on direct                            and retroflexion views. Recommendation:           - Repeat colonoscopy after studies are complete for                            surveillance based on pathology results.                           - Patient has a contact number available for                            emergencies. The signs and symptoms of potential                            delayed complications were discussed with the                            patient. Return  to normal activities tomorrow.                            Written discharge instructions were provided to the                            patient.                           -  Resume previous diet.                           - Continue present medications.                           - Await pathology results.                           - No aspirin, ibuprofen, naproxen, or other                            non-steroidal anti-inflammatory drugs for 2 weeks                            after polyp removal. Ladene Artist, MD 04/24/2021 3:40:59 PM This report has been signed electronically.

## 2021-04-24 NOTE — Progress Notes (Signed)
History & Physical  Primary Care Physician:  Denita Lung, MD Primary Gastroenterologist: Lucio Edward, MD  CHIEF COMPLAINT:  Personal history of colon polyps   HPI: [name] [name] is a 56 y.o. male with a personal history of sessile serrated colon polyps in 2015 and intermittent right flank and right-sided abdominal pain for 2 years without a clear etiology for colonoscopy.   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 Authorizing Provider  Multiple Vitamin (MULTIVITAMIN) tablet Take 1 tablet by mouth daily.   Yes [provider]  sildenafil (VIAGRA) 100 MG tablet Take by mouth. 07/20/15  Yes [provider]  clotrimazole-betamethasone (LOTRISONE) cream Apply 1 application topically daily. 04/19/21   Tysinger, Camelia Eng, PA-C  cyclobenzaprine (FLEXERIL) 10 MG tablet Take 10 mg by mouth 2 (two) times daily as needed. 02/09/21   [provider]  EPINEPHrine 0.3 mg/0.3 mL IJ SOAJ injection  10/07/16   [provider]  gabapentin (NEURONTIN) 300 MG capsule Take 300 mg by mouth 3 (three) times daily. Patient not taking: Reported on 04/24/2021 02/08/21   [provider]    Current Outpatient Medications  Medication Sig Dispense Refill   Multiple Vitamin (MULTIVITAMIN) tablet Take 1 tablet by mouth daily.     sildenafil (VIAGRA) 100 MG tablet Take by mouth.     clotrimazole-betamethasone (LOTRISONE) cream Apply 1  application topically daily. 30 g 0   cyclobenzaprine (FLEXERIL) 10 MG tablet Take 10 mg by mouth 2 (two) times daily as needed.     EPINEPHrine 0.3 mg/0.3 mL IJ SOAJ injection      gabapentin (NEURONTIN) 300 MG capsule Take 300 mg by mouth 3 (three) times daily. (Patient not taking: Reported on 04/24/2021)     Current Facility-Administered Medications  Medication Dose Route Frequency Provider Last Rate Last Admin   0.9 %  sodium chloride infusion  500 mL Intravenous Continuous Ladene Artist, MD        Allergies as of 04/24/2021 - Review Complete 04/24/2021  Allergen Reaction Noted   Baclofen Hives 05/08/2016   Penicillins Rash 10/06/2011    Family History  Problem Relation Age of Onset   Arthritis Mother    Colon cancer Neg Hx    Esophageal cancer Neg Hx    Rectal cancer Neg Hx    Stomach cancer Neg Hx    Allergic rhinitis Neg Hx    Angioedema Neg Hx    Asthma Neg Hx    Immunodeficiency Neg Hx    Urticaria Neg Hx    Eczema Neg Hx    Atopy Neg Hx     Social History   Socioeconomic History   Marital status: Divorced    Spouse name: Not on file   Number of children: 0   Years of education: Not on file   Highest education level: Not on file  Occupational History   Occupation: retired/disabilty  Tobacco Use   Smoking status: Former    Types: Cigarettes    Quit date: 07/14/2010    Years since quitting: 10.7   Smokeless tobacco: Never  Vaping Use   Vaping Use: Never used  Substance and Sexual Activity   Alcohol use: No   Drug use: No   Sexual activity: Not Currently  Other Topics Concern   Not on file  Social History Narrative   Not on file   Social Determinants of Health   Financial Resource Strain: Low Risk    Difficulty of Paying Living Expenses: Not hard at all  Food Insecurity: No Food Insecurity   Worried About Charity fundraiser in the Last Year: Never true   [place] in the Last Year: Never true  Transportation Needs: No Transportation Needs    Lack of Transportation (Medical): No   Lack of Transportation (Non-Medical): No  Physical Activity: Insufficiently Active   Days of Exercise per Week: 7 days   Minutes of Exercise per Session: 20 min  Stress: No Stress Concern Present   Feeling of Stress : Not at all  Social Connections: Not on file  Intimate Partner Violence: Not on file    Review of Systems:  All systems reviewed an negative except where noted in HPI.  Gen: Denies any fever, chills, sweats, anorexia, fatigue, weakness, malaise, weight loss, and sleep disorder CV: Denies chest pain, angina, palpitations, syncope, orthopnea, PND, peripheral edema, and claudication. Resp: Denies dyspnea at rest, dyspnea with exercise, cough, sputum, wheezing, coughing up blood, and pleurisy. GI: Denies vomiting blood, jaundice, and fecal incontinence.   Denies dysphagia or odynophagia. GU : Denies urinary burning, blood in urine, urinary frequency, urinary hesitancy, nocturnal urination, and urinary incontinence. MS: Denies joint pain, limitation of movement, and swelling, stiffness, low back pain, extremity pain. Denies muscle weakness, cramps, atrophy.  Derm: Denies rash, itching, dry skin, hives, moles, warts, or unhealing ulcers.  Psych: Denies depression, anxiety, memory loss, suicidal ideation, hallucinations, paranoia, and confusion. Heme: Denies bruising, bleeding, and enlarged lymph nodes. Neuro:  Denies any headaches, dizziness, paresthesias. Endo:  Denies any problems with DM, thyroid, adrenal function.   Physical Exam: Vital signs in last 24 hours: General:  Alert, well-developed, in NAD Head:  Normocephalic and atraumatic. Eyes:  Sclera clear, no icterus.   Conjunctiva pink. Ears:  Normal auditory acuity. Mouth:  No deformity or lesions.  Neck:  Supple; no masses . Lungs:  Clear throughout to auscultation.   No wheezes, crackles, or rhonchi. No acute distress. Heart:  Regular rate and rhythm; no murmurs. Abdomen:   Soft, nondistended, nontender. No masses, hepatomegaly. No obvious masses.  Normal bowel .    Rectal:  Deferred   Msk:  Symmetrical without gross deformities.. Pulses:  Normal pulses noted. Extremities:  Without edema. Neurologic:  Alert and  oriented x4;  grossly normal neurologically. Skin:  Intact without significant lesions or rashes. Cervical Nodes:  No significant cervical adenopathy. Psych:  Alert and cooperative. Normal mood and affect.   Impression / Plan:   Personal history of sessile serrated colon polyps in 2015 and intermittent right flank and right-sided abdominal pain for 2 years without a clear etiology for colonoscopy.   Pricilla Riffle. Fuller Plan  04/24/2021, 2:40 PM See Shea Evans, [facility], to contact our on call provider

## 2021-04-24 NOTE — Progress Notes (Signed)
Called to room to assist during endoscopic procedure.  Patient ID and intended procedure confirmed with present staff. Received instructions for my participation in the procedure from the performing physician.  

## 2021-04-24 NOTE — Progress Notes (Signed)
Sedate, gd SR, tolerated procedure well, VSS, report to RN 

## 2021-04-24 NOTE — Patient Instructions (Signed)
Read all handouts given to you today  Await pathology results  NO ASPIRIN, ASPIRIN CONTAINING PRODUCTS (BC OR GOODY POWDERS) OR NSAIDS (IBUPROFEN, ADVIL, ALEVE, AND MOTRIN) FOR TWO WEEKS; TYLENOL IS OK TO TAKE   YOU HAD AN ENDOSCOPIC PROCEDURE TODAY AT THE [facility]:   Refer to the procedure report that was given to you for any specific questions about what was found during the examination.  If the procedure report does not answer your questions, please call your gastroenterologist to clarify.  If you requested that your care partner not be given the details of your procedure findings, then the procedure report has been included in a sealed envelope for you to review at your convenience later.  YOU SHOULD EXPECT: Some feelings of bloating in the abdomen. Passage of more gas than usual.  Walking can help get rid of the air that was put into your GI tract during the procedure and reduce the bloating. If you had a lower endoscopy (such as a colonoscopy or flexible sigmoidoscopy) you may notice spotting of blood in your stool or on the toilet paper. If you underwent a bowel prep for your procedure, you may not have a normal bowel movement for a few days.  Please Note:  You might notice some irritation and congestion in your nose or some drainage.  This is from the oxygen used during your procedure.  There is no need for concern and it should clear up in a day or so.  SYMPTOMS TO REPORT IMMEDIATELY:  Following lower endoscopy (colonoscopy or flexible sigmoidoscopy):  Excessive amounts of blood in the stool  Significant tenderness or worsening of abdominal pains  Swelling of the abdomen that is new, acute  Fever of 100F or higher  For urgent or emergent issues, a gastroenterologist can be reached at any hour by calling [phone]. Do not use MyChart messaging for urgent concerns.    DIET:  We do recommend a small meal at first, but then you may proceed to your regular diet.   Drink plenty of fluids but you should avoid alcoholic beverages for 24 hours.  ACTIVITY:  You should plan to take it easy for the rest of today and you should NOT DRIVE or use heavy machinery until tomorrow (because of the sedation medicines used during the test).    FOLLOW UP: Our staff will call the number listed on your records 48-72 hours following your procedure to check on you and address any questions or concerns that you may have regarding the information given to you following your procedure. If we do not reach you, we will leave a message.  We will attempt to reach you two times.  During this call, we will ask if you have developed any symptoms of COVID 19. If you develop any symptoms (ie: fever, flu-like symptoms, shortness of breath, cough etc.) before then, please call [phone].  If you test positive for Covid 19 in the 2 weeks post procedure, please call and report this information to [place].    If any biopsies were taken you will be contacted by phone or by letter within the next 1-3 weeks.  Please call us at [phone] if you have not heard about the biopsies in 3 weeks.    SIGNATURES/CONFIDENTIALITY: You and/or your care partner have signed paperwork which will be entered into your electronic medical record.  These signatures attest to the fact that that the information above on your After Visit Summary has been reviewed and  is understood.  Full responsibility of the confidentiality of this discharge information lies with you and/or your care-partner.

## 2021-04-26 ENCOUNTER — Telehealth: Payer: Self-pay | Admitting: *Deleted

## 2021-04-26 ENCOUNTER — Telehealth: Payer: Self-pay

## 2021-04-26 NOTE — Telephone Encounter (Signed)
°  Follow up Call-  Call back number 04/24/2021  Post procedure Call Back phone  # [phone]  Permission to leave phone message Yes  Some recent data might be hidden     Left message

## 2021-04-26 NOTE — Telephone Encounter (Signed)
Left message on f/u call 

## 2021-05-06 ENCOUNTER — Encounter: Payer: Self-pay | Admitting: Gastroenterology

## 2021-05-29 ENCOUNTER — Ambulatory Visit (INDEPENDENT_AMBULATORY_CARE_PROVIDER_SITE_OTHER): Payer: Medicare Other | Admitting: Family Medicine

## 2021-05-29 ENCOUNTER — Encounter: Payer: Self-pay | Admitting: Family Medicine

## 2021-05-29 ENCOUNTER — Other Ambulatory Visit: Payer: Self-pay

## 2021-05-29 VITALS — BP 100/68 | HR 90 | Temp 97.4°F | Wt 172.8 lb

## 2021-05-29 DIAGNOSIS — S63651A Sprain of metacarpophalangeal joint of left index finger, initial encounter: Secondary | ICD-10-CM | POA: Diagnosis not present

## 2021-05-29 NOTE — Progress Notes (Signed)
° °  Subjective:    Patient ID: [name] [name], male    DOB: 1965-12-15, 56 y.o.   MRN: [id number]  HPI He states that on Sunday while trying to open up a difficult bottle he put a lot of pressure in the palm of his hand on the left over the second MCP joint and since then has had a lot of pain and swelling in that area.   Review of Systems     Objective:   Physical Exam Exam of the left hand does show slight edema noted at the second MCP joint he is much more tender to palpation over the palmar surface of that joint.  There is also some slight swelling in that area as well.       Assessment & Plan:  Sprain of metacarpophalangeal (MCP) joint of left index finger, initial encounter I explained that this is more of a contusion to this area because of the pressure they put on it.  Recommend conservative care with heat and anti-inflammatory of choice.  Splane that he should go away on its own.

## 2021-05-29 NOTE — Progress Notes (Deleted)
Acute Office Visit  Subjective:    Patient ID: [name] [name], male    DOB: 1965-07-10, 55 y.o.   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    Other postprocedural status(V45.89)    inguinal herniorrhaphies bilateral    Sleep apnea    cpap   Testosterone deficiency 11/04/11    Past Surgical History:  Procedure Laterality Date   HERNIA REPAIR Bilateral 1971   LAPAROSCOPIC CHOLECYSTECTOMY  2014    Family History  Problem Relation Age of Onset   Arthritis Mother    Colon cancer Neg Hx    Esophageal cancer Neg Hx    Rectal cancer Neg Hx    Stomach cancer Neg Hx    Allergic rhinitis Neg Hx    Angioedema Neg Hx    Asthma Neg Hx    Immunodeficiency Neg Hx    Urticaria Neg Hx    Eczema Neg Hx    Atopy Neg Hx     Social History   Socioeconomic History   Marital status: Divorced    Spouse name: Not on file   Number of children: 0   Years of education: Not on file   Highest education level: Not on file  Occupational History   Occupation: retired/disabilty  Tobacco Use   Smoking status: Former    Types: Cigarettes    Quit date: 07/14/2010    Years since quitting: 10.8   Smokeless tobacco: Never  Vaping Use   Vaping Use: Never used  Substance and Sexual Activity   Alcohol use: No   Drug use: No   Sexual activity: Not Currently  Other Topics Concern   Not on file  Social History Narrative   Not on file   Social Determinants of Health   Financial Resource Strain: Low Risk    Difficulty of Paying Living Expenses: Not hard at all  Food Insecurity: No Food Insecurity   Worried About Charity fundraiser in the Last Year:  Never true   Ran Out of Food in the Last Year: Never true  Transportation Needs: No Transportation Needs   Lack of Transportation (Medical): No   Lack of Transportation (Non-Medical): No  Physical Activity: Insufficiently Active   Days of Exercise per Week: 7 days   Minutes of Exercise per Session: 20 min  Stress: No Stress Concern Present   Feeling of Stress : Not at all  Social Connections: Not on file  Intimate Partner Violence: Not on file    Outpatient Medications Prior to Visit  Medication Sig Dispense Refill   Multiple Vitamin (MULTIVITAMIN) tablet Take 1 tablet by mouth daily.     sildenafil (VIAGRA) 100 MG tablet Take by mouth.     clotrimazole-betamethasone (LOTRISONE) cream Apply 1 application topically daily. (Patient not taking: Reported on 05/29/2021) 30 g 0   cyclobenzaprine (FLEXERIL) 10 MG tablet Take 10 mg by mouth 2 (two) times daily as needed. (Patient not taking: Reported on 05/29/2021)     EPINEPHrine 0.3 mg/0.3 mL IJ SOAJ injection  (Patient not taking: Reported on 05/29/2021)  gabapentin (NEURONTIN) 300 MG capsule Take 300 mg by mouth 3 (three) times daily. (Patient not taking: Reported on 04/24/2021)     No facility-administered medications prior to visit.    Allergies  Allergen Reactions   Baclofen Hives   Penicillins Rash    Review of Systems     Objective:    Physical Exam  BP 100/68    Pulse 90    Temp (!) 97.4 F (36.3 C)    Wt 172 lb 12.8 oz (78.4 kg)    SpO2 99%    BMI 24.79 kg/m  Wt Readings from Last 3 Encounters:  05/29/21 172 lb 12.8 oz (78.4 kg)  04/24/21 168 lb (76.2 kg)  04/19/21 173 lb 9.6 oz (78.7 kg)    Health Maintenance Due  Topic Date Due   Hepatitis C Screening  Never done   Zoster Vaccines- Shingrix (1 of 2) Never done   TETANUS/TDAP  04/24/2020    There are no preventive care reminders to display for this patient.   Lab Results  Component Value Date   TSH 1.488 11/03/2012   Lab Results  Component Value Date    WBC 6.7 01/10/2021   HGB 14.0 01/10/2021   HCT 41.7 01/10/2021   MCV 86 01/10/2021   PLT 276 01/10/2021   Lab Results  Component Value Date   NA 142 01/10/2021   K 4.9 01/10/2021   CO2 23 01/10/2021   GLUCOSE 93 01/10/2021   BUN 15 01/10/2021   CREATININE 0.79 01/10/2021   BILITOT 0.4 01/10/2021   ALKPHOS 52 01/10/2021   AST 18 01/10/2021   ALT 15 01/10/2021   PROT 7.5 01/10/2021   ALBUMIN 4.8 01/10/2021   CALCIUM 9.8 01/10/2021   ANIONGAP 5 (L) 12/30/2012   EGFR 104 01/10/2021   GFR 109.70 03/16/2015   Lab Results  Component Value Date   CHOL 213 (H) 10/23/2014   Lab Results  Component Value Date   HDL 36 (L) 10/23/2014   Lab Results  Component Value Date   LDLCALC 143 (H) 10/23/2014   Lab Results  Component Value Date   TRIG 171 (H) 10/23/2014   Lab Results  Component Value Date   CHOLHDL 5.9 10/23/2014   Lab Results  Component Value Date   HGBA1C 5.3 11/03/2012       Assessment & Plan:   Problem List Items Addressed This Visit   None    No orders of the defined types were placed in this encounter.    Jill Alexanders, MD

## 2021-05-29 NOTE — Patient Instructions (Signed)
Heat for 20 minutes 3 times per day and 2 Aleve twice per day for the next week.  Nothing is something hurts do not do it

## 2021-12-17 ENCOUNTER — Ambulatory Visit (INDEPENDENT_AMBULATORY_CARE_PROVIDER_SITE_OTHER): Payer: Medicare Other | Admitting: Family Medicine

## 2021-12-17 ENCOUNTER — Encounter: Payer: Self-pay | Admitting: Family Medicine

## 2021-12-17 VITALS — BP 102/72 | HR 84 | Temp 97.5°F | Wt 175.6 lb

## 2021-12-17 DIAGNOSIS — M25521 Pain in right elbow: Secondary | ICD-10-CM | POA: Diagnosis not present

## 2021-12-17 DIAGNOSIS — S42401A Unspecified fracture of lower end of right humerus, initial encounter for closed fracture: Secondary | ICD-10-CM | POA: Diagnosis not present

## 2021-12-17 DIAGNOSIS — M25531 Pain in right wrist: Secondary | ICD-10-CM | POA: Diagnosis not present

## 2021-12-17 DIAGNOSIS — S62101A Fracture of unspecified carpal bone, right wrist, initial encounter for closed fracture: Secondary | ICD-10-CM | POA: Diagnosis not present

## 2021-12-17 NOTE — Progress Notes (Signed)
   Subjective:    Patient ID: [name] [name], male    DOB: [birth year], 56 y.o.   MRN: [id number]  HPI He states that he fell last Friday tripping over some steps.  He now complains of right elbow pain especially with supination and also wrist pain.   Review of Systems     Objective:   Physical Exam Exam of the right wrist does show pain on supination as well as pain over the head of the radius.  Exam of the wrist shows pain over the carpals specially with clenched fist.  No pain over snuffbox.       Assessment & Plan:  Right wrist pain - Plan: DG Wrist Complete Right  Right elbow pain - Plan: DG Elbow Complete Right Continue with Tylenol and Advil. 12/18/21 X-rays do show evidence of fracture.  Referral was made.

## 2021-12-18 ENCOUNTER — Ambulatory Visit
Admission: RE | Admit: 2021-12-18 | Discharge: 2021-12-18 | Disposition: A | Discharge status: Home / Self Care | Payer: Medicare Other | Source: Ambulatory Visit | Attending: Family Medicine | Admitting: Family Medicine

## 2021-12-18 DIAGNOSIS — S52131A Displaced fracture of neck of right radius, initial encounter for closed fracture: Secondary | ICD-10-CM | POA: Diagnosis not present

## 2021-12-18 DIAGNOSIS — M25531 Pain in right wrist: Secondary | ICD-10-CM | POA: Diagnosis not present

## 2021-12-18 NOTE — Addendum Note (Signed)
Addended by: Denita Lung on: 12/18/2021 05:17 PM   Modules accepted: Orders

## 2021-12-19 ENCOUNTER — Encounter: Payer: Self-pay | Admitting: Orthopaedic Surgery

## 2021-12-19 ENCOUNTER — Ambulatory Visit: Payer: Medicare Other | Admitting: Orthopaedic Surgery

## 2021-12-19 DIAGNOSIS — S52124A Nondisplaced fracture of head of right radius, initial encounter for closed fracture: Secondary | ICD-10-CM | POA: Diagnosis not present

## 2021-12-19 DIAGNOSIS — S52181A Other fracture of upper end of right radius, initial encounter for closed fracture: Secondary | ICD-10-CM

## 2021-12-19 DIAGNOSIS — S5290XA Unspecified fracture of unspecified forearm, initial encounter for closed fracture: Secondary | ICD-10-CM | POA: Insufficient documentation

## 2021-12-19 DIAGNOSIS — S52121A Displaced fracture of head of right radius, initial encounter for closed fracture: Secondary | ICD-10-CM | POA: Insufficient documentation

## 2021-12-19 NOTE — Progress Notes (Signed)
Office Visit Note   Patient: [name] [name]           Date of Birth: November 25, 1965           MRN: [id number] Visit Date: 12/19/2021              Requested by: Denita Lung, MD [address] [place],  [place] [address] PCP: Denita Lung, MD   Assessment & Plan: Visit Diagnoses:  1. Other closed fracture of proximal end of right radius, initial encounter   2. Closed nondisplaced fracture of head of right radius, initial encounter     Plan: Mr. Rathman was visiting his home in the [facility] area on 1 September when he slipped and fell injuring his right upper extremity.  Rather than go to a busy emergency room he waited until he came back to [place] 2 days ago and saw Dr. Redmond School.  He obtained films demonstrating a transverse fracture just distal to the right radial head and a possible avulsion fracture of the triquetrum of the right wrist.  He is referred to the office for further evaluation.  Mr. Tagg has a history of MS and does use a cane.  X-rays revealed a minimally displaced transverse fracture distal to the radial head without intra-articular extension.  I think the position is acceptable and we will place him in a long-arm splint.  He does have some tenderness over the dorsum of the wrist but no swelling.  I thought there was a little widening of the radial carpal joint and a small 1 mm avulsion chip dorsally which may or may not be new.  That will be immobilized as well in the long-arm splint.  I would like to see him back in a week and repeat films of the elbow.  I expect the period of mobilization to be at least 4 weeks or until we begin to see some callus.  He is comfortable in the sling and splint and will not need any pain meds  Follow-Up Instructions: Return in about 1 week (around 12/26/2021).   Orders:  No orders of the defined types were placed in this encounter.  No orders of the defined types were placed in this encounter.     Procedures: No  procedures performed   Clinical Data: No additional findings.   Subjective: Chief Complaint  Patient presents with   Right Wrist - New Patient (Initial Visit)   Right Elbow - New Patient (Initial Visit)   Patient presents today as a new patient for right wrist and right elbow pain. He states that on 12/13/2021 he was carrying a load of objects and tripped over some stone stairs and injured himself. He is currently ambulating with a cane at this time and taking over the counter ibuprofen for pain.   Review of Systems   Objective: Vital Signs: There were no vitals taken for this visit.  Physical Exam Constitutional:      Appearance: He is well-developed.  Eyes:     Pupils: Pupils are equal, round, and reactive to light.  Pulmonary:     Effort: Pulmonary effort is normal.  Skin:    General: Skin is warm and dry.  Neurological:     Mental Status: He is alert and oriented to person, place, and time.  Psychiatric:        Behavior: Behavior normal.     Ortho Exam awake alert and oriented x3.  Comfortable sitting.  Limited range of motion of right  elbow related to pain.  Lumbar neutral position he has a little pronation and supination but with discomfort over the radial head area.  Flexion extension is a little more with a total of about 30 degrees of motion from neutral position.  Neurologically intact.  A little ecchymosis in the dorsum of the wrist with some tenderness at the mid radiocarpal joint.  No swelling of the digits.  Motor exam intact.  Skin intact.  Specialty Comments:  No specialty comments available.  Imaging: DG Wrist Complete Right  Result Date: 12/18/2021 CLINICAL DATA:  Right wrist pain.  Fall 5 days prior. EXAM: RIGHT WRIST - COMPLETE 3+ VIEW COMPARISON:  None Available. FINDINGS: There is a small 2 mm ossicle seen just dorsal to the midcarpal joint on lateral view suspicious for a triquetral avulsion fracture. Mild triscaphe and thumb carpometacarpal joint  space narrowing and peripheral osteophytosis. No dislocation. IMPRESSION: Small ossicle dorsal to the carpals on lateral view suspicious for an acute triquetral avulsion fracture. Electronically Signed   By: Yvonne Kendall M.D.   On: 12/18/2021 15:04   DG Elbow Complete Right  Result Date: 12/18/2021 CLINICAL DATA:  Fall 5 days ago.  Pain with supination. EXAM: RIGHT ELBOW - COMPLETE 3+ VIEW COMPARISON:  None Available. FINDINGS: There is an acute transverse fracture of the distal aspect of the radial neck with approximately 2 mm ulnar/medial and 2 mm posterior displacement of the distal fracture component with respect to the proximal fracture component. No dislocation. Minimal peripheral medial elbow degenerative osteophytosis/spurring at the medial aspect of the coronoid process. There is elevation of the distal anterior humeral fat pad and an elbow joint effusion. IMPRESSION: Acute transverse fracture of the distal aspect of the radial neck with mild 2 mm medial and 2 mm posterior displacement of the distal fracture component. Electronically Signed   By: Yvonne Kendall M.D.   On: 12/18/2021 15:02     PMFS History: Patient Active Problem List   Diagnosis Date Noted   Radius fracture 12/19/2021   Right radial head fracture 12/19/2021   Right sided abdominal pain 02/04/2019   Former smoker 01/18/2019   Food allergy 10/24/2016   Other allergic rhinitis 10/24/2016   Allergic conjunctivitis 10/24/2016   History of adenomatous polyp of colon 10/23/2014   Multiple sclerosis ([place]) 11/03/2012   Hypogonadism male 10/06/2011   Past Medical History:  Diagnosis Date   Heart murmur    Multiple sclerosis ([place])    dx 2014   Neuromuscular disorder ([place]) 10/2012   "high probability of MS"   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    Other postprocedural status(V45.89)    inguinal herniorrhaphies bilateral    Sleep apnea    cpap   Testosterone deficiency 11/04/11    Family  History  Problem Relation Age of Onset   Arthritis Mother    Colon cancer Neg Hx    Esophageal cancer Neg Hx    Rectal cancer Neg Hx    Stomach cancer Neg Hx    Allergic rhinitis Neg Hx    Angioedema Neg Hx    Asthma Neg Hx    Immunodeficiency Neg Hx    Urticaria Neg Hx    Eczema Neg Hx    Atopy Neg Hx     Past Surgical History:  Procedure Laterality Date   HERNIA REPAIR Bilateral 1971   LAPAROSCOPIC CHOLECYSTECTOMY  2014   Social History   Occupational History   Occupation: retired/disabilty  Tobacco  Use   Smoking status: Former    Types: Cigarettes    Quit date: 07/14/2010    Years since quitting: 11.4   Smokeless tobacco: Never  Vaping Use   Vaping Use: Never used  Substance and Sexual Activity   Alcohol use: No   Drug use: No   Sexual activity: Not Currently

## 2021-12-26 ENCOUNTER — Encounter: Payer: Self-pay | Admitting: Physician Assistant

## 2021-12-26 ENCOUNTER — Ambulatory Visit (INDEPENDENT_AMBULATORY_CARE_PROVIDER_SITE_OTHER): Payer: Medicare Other

## 2021-12-26 ENCOUNTER — Ambulatory Visit: Payer: Medicare Other | Admitting: Physician Assistant

## 2021-12-26 DIAGNOSIS — S52121D Displaced fracture of head of right radius, subsequent encounter for closed fracture with routine healing: Secondary | ICD-10-CM

## 2021-12-26 DIAGNOSIS — S52124A Nondisplaced fracture of head of right radius, initial encounter for closed fracture: Secondary | ICD-10-CM

## 2021-12-26 NOTE — Progress Notes (Signed)
Office Visit Note   Patient: [name] [name]           Date of Birth: 14-Oct-1965           MRN: [id number] Visit Date: 12/26/2021              Requested by: Denita Lung, MD [address] [place],  [place] [address] PCP: Denita Lung, MD  Chief Complaint  Patient presents with   Right Elbow - Follow-up, Fracture   Right Wrist - Fracture, Follow-up      HPI: [name] [name] is a pleasant 56 year old gentleman who is 13 days status post falling while up in [facility].  He sustained a transverse radial head fracture.  He also had some wrist pain and there was a little widening of the radiocarpal joint and a small 1 mm avulsion chip dorsally which may or may not be new.  He was immobilized in a long-arm cast last week.  He is having quite a bit of itching with the cast and feels quite uncomfortable.  It is difficult for him as he has MS and does live by himself.  He is not requiring any pain medication.  Assessment & Plan: Visit Diagnoses:  1. Closed nondisplaced fracture of head of right radius, initial encounter   2. Closed displaced fracture of head of right radius with routine healing, subsequent encounter     Plan: Patient is doing quite well.  He does not have any tenderness to palpation in his wrist at all.  He does have MS and lives by himself.  Given this he was placed in a new splint that would allow him more motion with his hand.  He felt the splint was much more comfortable.  We will follow-up for repeat x-ray of just the elbow in 1 week  Follow-Up Instructions: No follow-ups on file.   Ortho Exam  Patient is alert, oriented, no adenopathy, well-dressed, normal affect, normal respiratory effort. Examination he has no swelling or ecchymosis about the wrist even to deep palpation is not tender.  He can oppose all his fingers and radial pulses intact.  Sensation is intact.  No swelling or ecchymosis about the elbow.  Skin is in good condition.  No skin  breakdown.  Imaging: XR Elbow Complete Right (3+View)  Result Date: 12/26/2021 Three-view radiographs of the right elbow were obtained today.  Well-maintained alignment.  He has a transverse fracture of the radial head which remains reduced in good position.  No other acute osseous changes x-rays were compared to previous a week ago  No images are attached to the encounter.  Labs: Lab Results  Component Value Date   HGBA1C 5.3 11/03/2012     Lab Results  Component Value Date   ALBUMIN 4.8 01/10/2021   ALBUMIN 4.2 10/23/2014   ALBUMIN 4.6 10/21/2013    No results found for: "MG" No results found for: "VD25OH"  No results found for: "PREALBUMIN"    Latest Ref Rng & Units 01/10/2021    3:55 PM 10/23/2014   12:01 AM 10/21/2013   10:40 AM  CBC EXTENDED  WBC 3.4 - 10.8 x10E3/uL 6.7  8.5  10.0   RBC 4.14 - 5.80 x10E6/uL 4.87  4.36  4.54   Hemoglobin 13.0 - 17.7 g/dL 14.0  12.3  13.1   HCT 37.5 - 51.0 % 41.7  37.5  38.3   Platelets 150 - 450 x10E3/uL 276  270  297   NEUT# 1.4 - 7.0 x10E3/uL  4.0  3.4  4.9   Lymph# 0.7 - 3.1 x10E3/uL 1.9  3.8  3.7      There is no height or weight on file to calculate BMI.  Orders:  Orders Placed This Encounter  Procedures   XR Elbow Complete Right (3+View)   XR Wrist 2 Views Right   No orders of the defined types were placed in this encounter.    Procedures: No procedures performed  Clinical Data: No additional findings.  ROS:  All other systems negative, except as noted in the HPI. Review of Systems  Objective: Vital Signs: There were no vitals taken for this visit.  Specialty Comments:  No specialty comments available.  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    Other postprocedural status(V45.89)    inguinal herniorrhaphies bilateral    Sleep apnea    cpap   Testosterone deficiency 11/04/11    Family History  Problem Relation Age of Onset   Arthritis Mother    Colon cancer Neg Hx    Esophageal cancer Neg Hx    Rectal cancer Neg Hx    Stomach cancer Neg Hx    Allergic rhinitis Neg Hx    Angioedema Neg Hx    Asthma Neg Hx    Immunodeficiency Neg Hx    Urticaria Neg Hx    Eczema Neg Hx    Atopy Neg Hx     Past Surgical History:  Procedure Laterality Date   HERNIA REPAIR Bilateral 1971   LAPAROSCOPIC CHOLECYSTECTOMY  2014   Social History   Occupational History   Occupation: retired/disabilty  Tobacco Use   Smoking status: Former    Types: Cigarettes    Quit date: 07/14/2010    Years since quitting: 11.4   Smokeless tobacco: Never  Vaping Use   Vaping Use: Never used  Substance and Sexual Activity   Alcohol use: No   Drug use: No   Sexual activity: Not Currently

## 2022-01-07 ENCOUNTER — Ambulatory Visit: Payer: Self-pay

## 2022-01-07 ENCOUNTER — Ambulatory Visit (INDEPENDENT_AMBULATORY_CARE_PROVIDER_SITE_OTHER): Payer: Medicare Other

## 2022-01-07 ENCOUNTER — Ambulatory Visit: Payer: Medicare Other | Admitting: Orthopaedic Surgery

## 2022-01-07 ENCOUNTER — Encounter: Payer: Self-pay | Admitting: Orthopaedic Surgery

## 2022-01-07 DIAGNOSIS — S52121D Displaced fracture of head of right radius, subsequent encounter for closed fracture with routine healing: Secondary | ICD-10-CM

## 2022-01-07 DIAGNOSIS — S52124D Nondisplaced fracture of head of right radius, subsequent encounter for closed fracture with routine healing: Secondary | ICD-10-CM

## 2022-01-07 NOTE — Progress Notes (Signed)
Office Visit Note   Patient: [name] [name]           Date of Birth: May 11, 1965           MRN: [id number] Visit Date: 01/07/2022              Requested by: Denita Lung, MD [address] [place],  [place] [address] PCP: Denita Lung, MD   Assessment & Plan: Visit Diagnoses:  1. Closed displaced fracture of head of right radius with routine healing, subsequent encounter   2. Closed nondisplaced fracture of head of right radius with routine healing, subsequent encounter     Plan: [name] is now 4 weeks status post fall while he was up in [place].  Fell onto his right arm.  He sustained a transverse nondisplaced radial head fracture and questionably a small avulsion fracture in his wrist.  He has been immobilized in a splint.  He is here for routine follow-up.  He does have a history of multiple sclerosis so has been a bit challenging for him to do activities of daily living.  His x-rays show good callus around the radial neck fracture.  He is able to go to full extension on his elbow he has no paresthesias in his fingers he has a good radial pulse.  He is able to fully pronate his hand.  He does have difficulty still with supination.  We have told him he may discontinue the splint.  Should still avoid torquing motions with his hand as well as lifting heavy objects though this should be mostly limited by pain.  Would like to see him back for final check in 2 weeks does not need x-rays  Follow-Up Instructions: Return in about 2 weeks (around 01/21/2022).   Orders:  Orders Placed This Encounter  Procedures   XR Wrist Complete Right   XR Elbow Complete Right (3+View)   No orders of the defined types were placed in this encounter.     Procedures: No procedures performed   Clinical Data: No additional findings.   Subjective: Chief Complaint  Patient presents with   Right Elbow - Fracture, Follow-up   Right Wrist - Pain    HPI  Review of Systems  All other  systems reviewed and are negative.    Objective: Vital Signs: There were no vitals taken for this visit.  Physical Exam Constitutional:      Appearance: Normal appearance.  Pulmonary:     Effort: Pulmonary effort is normal.  Skin:    General: Skin is warm and dry.  Neurological:     Mental Status: He is alert.     Ortho Exam Examination of his right arm skin is in good condition his arm is warm he has good capillary refill in all of his digits easily palpable radial pulse.  He is able to move all of his fingers with ease.  He has good dorsiflexion and extension of the wrist.  No real tenderness to palpation over the wrist joint.  No ecchymosis no swelling.  He does have full extension of his elbow.  He is able to pronate but does have some pain with supinating and is stiff.  Still some tenderness to palpation over the radial head. Specialty Comments:  No specialty comments available.  Imaging: XR Elbow Complete Right (3+View)  Result Date: 01/07/2022 Three-view radiographs of his right elbow demonstrate well-maintained alignment through the elbow joint.  Radial neck fracture remains well reduced and has evidence of good  bony callus no other appreciated osseous injuries no fat pad sign  XR Wrist Complete Right  Result Date: 01/07/2022 3 views of his right wrist are obtained today.  Well-maintained alignment through the carpal bones well-maintained alignment through the radiocarpal joint and ulna.  On dorsal x-ray there is a small avulsion fracture difficult to determine age but it is in good position and not displaced    PMFS History: Patient Active Problem List   Diagnosis Date Noted   Radius fracture 12/19/2021   Right radial head fracture 12/19/2021   Right sided abdominal pain 02/04/2019   Former smoker 01/18/2019   Food allergy 10/24/2016   Other allergic rhinitis 10/24/2016   Allergic conjunctivitis 10/24/2016   History of adenomatous polyp of colon 10/23/2014    Multiple sclerosis ([place]) 11/03/2012   Hypogonadism male 10/06/2011   Past Medical History:  Diagnosis Date   Heart murmur    Multiple sclerosis ([place])    dx 2014   Neuromuscular disorder ([place]) 10/2012   "high probability of MS"   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    Other postprocedural status(V45.89)    inguinal herniorrhaphies bilateral    Sleep apnea    cpap   Testosterone deficiency 11/04/11    Family History  Problem Relation Age of Onset   Arthritis Mother    Colon cancer Neg Hx    Esophageal cancer Neg Hx    Rectal cancer Neg Hx    Stomach cancer Neg Hx    Allergic rhinitis Neg Hx    Angioedema Neg Hx    Asthma Neg Hx    Immunodeficiency Neg Hx    Urticaria Neg Hx    Eczema Neg Hx    Atopy Neg Hx     Past Surgical History:  Procedure Laterality Date   HERNIA REPAIR Bilateral 1971   LAPAROSCOPIC CHOLECYSTECTOMY  2014   Social History   Occupational History   Occupation: retired/disabilty  Tobacco Use   Smoking status: Former    Types: Cigarettes    Quit date: 07/14/2010    Years since quitting: 11.4   Smokeless tobacco: Never  Vaping Use   Vaping Use: Never used  Substance and Sexual Activity   Alcohol use: No   Drug use: No   Sexual activity: Not Currently

## 2022-01-21 ENCOUNTER — Ambulatory Visit (INDEPENDENT_AMBULATORY_CARE_PROVIDER_SITE_OTHER): Payer: Medicare Other | Admitting: Orthopaedic Surgery

## 2022-01-21 ENCOUNTER — Encounter: Payer: Self-pay | Admitting: Internal Medicine

## 2022-01-21 ENCOUNTER — Encounter: Payer: Self-pay | Admitting: Orthopaedic Surgery

## 2022-01-21 ENCOUNTER — Ambulatory Visit (INDEPENDENT_AMBULATORY_CARE_PROVIDER_SITE_OTHER): Payer: Medicare Other

## 2022-01-21 DIAGNOSIS — M25531 Pain in right wrist: Secondary | ICD-10-CM | POA: Diagnosis not present

## 2022-01-21 DIAGNOSIS — S52101D Unspecified fracture of upper end of right radius, subsequent encounter for closed fracture with routine healing: Secondary | ICD-10-CM

## 2022-01-21 NOTE — Progress Notes (Signed)
Office Visit Note   Patient: [name] [name]           Date of Birth: [birth year]           MRN: [id number] Visit Date: 01/21/2022              Requested by: Denita Lung, MD [place],  [place] [address] PCP: Denita Lung, MD   Assessment & Plan: Visit Diagnoses:  1. Pain in right wrist   2. Closed fracture of proximal end of right radius with routine healing, unspecified fracture morphology, subsequent encounter     Plan: [name] comes in today for reevaluation he is now 6 weeks status post right radial head fracture after a fall.  We last saw him 2 weeks ago.  He is not currently wearing any splint or brace.  He does feel like he is slowly getting better.  He was concerned last week because he had what he describes as a lightening bolt sensation on the volar aspect of his arm between the elbow and the wrist.  Originally this area was numb to him.  He still has some tenderness on the radial side of his wrist.  X-rays today of his wrist were reassuring.  He has what he describes as tingling and sensitivity along the volar aspect of the forearm from the elbow to the wrist in the distribution of the radial nerve.  He's motor function is intact with good extension of his thumb and good extension and flexion of his wrist.  Grip strength is intact.  Significant improvement with now with active full pronation and near full active supination and has full passive supination.  We will have him follow-up in 1 month.  Should continue to work on range of motion  Follow-Up Instructions: No follow-ups on file.   Orders:  Orders Placed This Encounter  Procedures   XR Wrist Complete Right   No orders of the defined types were placed in this encounter.     Procedures: No procedures performed   Clinical Data: No additional findings.   Subjective: Chief Complaint  Patient presents with   Right Wrist - Follow-up   Right Elbow - Follow-up    HPI [name] [name] is a  pleasant 56 year old gentleman who is now 6 weeks status falling while at his house in the [place].  He sustained a radial head fracture and had some right wrist pain he reports he is doing better but last week had an episode of lightening type pain in his volar forearm  Review of Systems  All other systems reviewed and are negative.    Objective: Vital Signs: There were no vitals taken for this visit.  Physical Exam Constitutional:      Appearance: Normal appearance.  Skin:    General: Skin is warm and dry.  Neurological:     Mental Status: He is alert.     Ortho Exam Examination of his right elbow he has full extension.  He has full active pronation and almost full active supination.  He has good strength with flexion and extension of his wrist.  He has good extension of his thumb.  Mild tenderness to palpation over the wrist.  Is able to oppose all his fingers sensation is intact in his hand he does have some altered sensation on the volar surface of his forearm over the radial nerve distribution.  No tenderness to palpation over the fracture of the radial head Specialty Comments:  No specialty  comments available.  Imaging: XR Wrist Complete Right  Result Date: 01/21/2022 Complete radiographs of his right wrist were taken today.  He has well-maintained alignment and integrity of the radiocarpal joint.  On 1 view he has a small bony avulsion fragment which appears to be old.  Scaphoid lunate interval is intact no obvious fractures of the radius or ulna    PMFS History: Patient Active Problem List   Diagnosis Date Noted   Radius fracture 12/19/2021   Right radial head fracture 12/19/2021   Right sided abdominal pain 02/04/2019   Former smoker 01/18/2019   Food allergy 10/24/2016   Other allergic rhinitis 10/24/2016   Allergic conjunctivitis 10/24/2016   History of adenomatous polyp of colon 10/23/2014   Multiple sclerosis ([place]) 11/03/2012   Hypogonadism male 10/06/2011    Past Medical History:  Diagnosis Date   Heart murmur    Multiple sclerosis ([place])    dx 2014   Neuromuscular disorder ([place]) 10/2012   "high probability of MS"   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    Other postprocedural status(V45.89)    inguinal herniorrhaphies bilateral    Sleep apnea    cpap   Testosterone deficiency 11/04/11    Family History  Problem Relation Age of Onset   Arthritis Mother    Colon cancer Neg Hx    Esophageal cancer Neg Hx    Rectal cancer Neg Hx    Stomach cancer Neg Hx    Allergic rhinitis Neg Hx    Angioedema Neg Hx    Asthma Neg Hx    Immunodeficiency Neg Hx    Urticaria Neg Hx    Eczema Neg Hx    Atopy Neg Hx     Past Surgical History:  Procedure Laterality Date   HERNIA REPAIR Bilateral 1971   LAPAROSCOPIC CHOLECYSTECTOMY  2014   Social History   Occupational History   Occupation: retired/disabilty  Tobacco Use   Smoking status: Former    Types: Cigarettes    Quit date: 07/14/2010    Years since quitting: 11.5   Smokeless tobacco: Never  Vaping Use   Vaping Use: Never used  Substance and Sexual Activity   Alcohol use: No   Drug use: No   Sexual activity: Not Currently

## 2022-02-03 ENCOUNTER — Encounter: Payer: Self-pay | Admitting: Internal Medicine

## 2022-02-25 ENCOUNTER — Encounter: Payer: Self-pay | Admitting: Orthopaedic Surgery

## 2022-02-25 ENCOUNTER — Ambulatory Visit: Payer: Medicare Other | Admitting: Orthopaedic Surgery

## 2022-02-25 DIAGNOSIS — S52101D Unspecified fracture of upper end of right radius, subsequent encounter for closed fracture with routine healing: Secondary | ICD-10-CM

## 2022-02-25 NOTE — Progress Notes (Signed)
Office Visit Note   Patient: [name] [name]           Date of Birth: October 01, 1965           MRN: [id number] Visit Date: 02/25/2022              Requested by: Denita Lung, MD [place],  [place] [address] PCP: Denita Lung, MD   Assessment & Plan: Visit Diagnoses:  1. Closed fracture of proximal end of right radius with routine healing, unspecified fracture morphology, subsequent encounter     Plan: [name] is status post right wrist injury in which she sustained a nondisplaced fracture of the tip of the ulnar styloid and the distal radius.  He does have a history of MS and uses a cane and does have somewhat of an awkward gait but relates in terms of his right wrist he is doing quite well.  He does not have any issues with pronation and supination and most of the sensory loss that he had which is not necessarily explained on a dermatomal pattern has resolved.  He still has a little area of along the volar aspect of his forearm then he has some limited sensibility but normal along the proximal two thirds and the course is normal into his hand.  He is doing very well.  There is no tenderness about the fracture or swelling or any deformity we will plan to see him back as needed.  I think the MS played a part in some of the unusual feelings in his hand and forearm  Follow-Up Instructions: Return if symptoms worsen or fail to improve.   Orders:  No orders of the defined types were placed in this encounter.  No orders of the defined types were placed in this encounter.     Procedures: No procedures performed   Clinical Data: No additional findings.   Subjective: Chief Complaint  Patient presents with   Right Wrist - Pain  Returns for reevaluation of injury to his right wrist as previously outlined.  Doing very well and does not have any present complaints other than a little bit of decreased sensibility in the midportion of his forearm but not in a  dermatomal pattern  HPI  Review of Systems   Objective: Vital Signs: There were no vitals taken for this visit.  Physical Exam  Ortho Exam awake alert and oriented x3.  Comfortable sitting.  Walks with the use of a cane has somewhat of an awkward gait based on his history of MS.  Right wrist with full pronation and supination and flexion extension compared to his left wrist.  There is no swelling ecchymosis or redness.  No tenderness over the distal radius or ulna.  He is got good grip and good release and excellent capillary refill.  He did have some decreased sensibility along the forearm which has resolved except for 1 area in the mid forearm over several inches where he has limited sensibility but normal proximal and distal to that area.  Appears to be resolving with no Tinel's  Specialty Comments:  No specialty comments available.  Imaging: No results found.   PMFS History: Patient Active Problem List   Diagnosis Date Noted   Radius fracture 12/19/2021   Right radial head fracture 12/19/2021   Right sided abdominal pain 02/04/2019   Former smoker 01/18/2019   Food allergy 10/24/2016   Other allergic rhinitis 10/24/2016   Allergic conjunctivitis 10/24/2016   History of adenomatous  polyp of colon 10/23/2014   Multiple sclerosis ([facility]) 11/03/2012   Hypogonadism male 10/06/2011   Past Medical History:  Diagnosis Date   Heart murmur    Multiple sclerosis ([facility])    dx 2014   Neuromuscular disorder ([facility]) 10/2012   "high probability of MS"   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    Other postprocedural status(V45.89)    inguinal herniorrhaphies bilateral    Sleep apnea    cpap   Testosterone deficiency 11/04/11    Family History  Problem Relation Age of Onset   Arthritis Mother    Colon cancer Neg Hx    Esophageal cancer Neg Hx    Rectal cancer Neg Hx    Stomach cancer Neg Hx    Allergic rhinitis Neg Hx    Angioedema Neg Hx    Asthma  Neg Hx    Immunodeficiency Neg Hx    Urticaria Neg Hx    Eczema Neg Hx    Atopy Neg Hx     Past Surgical History:  Procedure Laterality Date   HERNIA REPAIR Bilateral 1971   LAPAROSCOPIC CHOLECYSTECTOMY  2014   Social History   Occupational History   Occupation: retired/disabilty  Tobacco Use   Smoking status: Former    Types: Cigarettes    Quit date: 07/14/2010    Years since quitting: 11.6   Smokeless tobacco: Never  Vaping Use   Vaping Use: Never used  Substance and Sexual Activity   Alcohol use: No   Drug use: No   Sexual activity: Not Currently     Garald Balding, MD   Note - This record has been created using [employer].  Chart creation errors have been sought, but may not always  have been located. Such creation errors do not reflect on  the standard of medical care.

## 2022-02-28 ENCOUNTER — Ambulatory Visit: Payer: Medicare Other

## 2022-03-21 ENCOUNTER — Ambulatory Visit: Payer: Medicare Other

## 2023-01-30 ENCOUNTER — Ambulatory Visit (INDEPENDENT_AMBULATORY_CARE_PROVIDER_SITE_OTHER): Payer: Medicare Other | Admitting: Medical

## 2023-01-30 VITALS — BP 110/64 | HR 63 | Temp 97.3°F | Wt 167.6 lb

## 2023-01-30 DIAGNOSIS — R21 Rash and other nonspecific skin eruption: Secondary | ICD-10-CM | POA: Diagnosis not present

## 2023-01-30 DIAGNOSIS — L259 Unspecified contact dermatitis, unspecified cause: Secondary | ICD-10-CM

## 2023-01-30 MED ORDER — HYDROXYZINE HCL 10 MG PO TABS: 10.0000 mg | ORAL_TABLET | Freq: Three times a day (TID) | ORAL | 0 refills | Status: AC | PRN

## 2023-01-30 NOTE — Progress Notes (Signed)
Subjective:  [name] [name] is a 57 y.o. male who presents for Chief Complaint  Patient presents with   Rash    Rash on right wrist. Itches only if hot water hits it     Here for rash.  Just started yesterday.  Was outside messing with some boards at his outbuilding but doesnt think he contacted poison ivy.   Started having some itching of right wrist later, and then today has a small rash itchy on right forearm.  In the past he reacts pretty bad to poison ivy.  He was looking online about other possible rash and he was worried about shingles.  No prior shingles.  No pain or warmth or burning sensation  Otherwise in normal state of health.  No other aggravating or relieving factors.    No other c/o.    The following portions of the patient's history were reviewed and updated as appropriate: allergies, current medications, past family history, past medical history, past social history, past surgical history and problem list.  ROS Otherwise as in subjective above  Objective: BP 110/64   Pulse 63   Temp (!) 97.3 F (36.3 C)   Wt 167 lb 9.6 oz (76 kg)   BMI 24.05 kg/m   General appearance: alert, no distress, well developed, well nourished Skin: Right distal forearm with a few small papular lesions and 2 little clusters approximately 2 mm diameter somewhat salmon-colored.  No erythematous base.  No linear distribution of rash to suggest shingles    Assessment: Encounter Diagnoses  Name Primary?   Contact dermatitis, unspecified contact dermatitis type, unspecified trigger Yes   Rash      Plan: We discussed the symptoms and rash.  Most likely contact dermatitis.  We discussed shingles, normal presentation of shingles and what to look for but currently no obvious signs this is shingles.    He has plenty of Lotrisone cream at home.  Since this has such a strong steroid he will use that cream for the next 3 to 4 days for this rash along with oral hydroxyzine below for itching  and rash.  If not resolved within the next 3 to 4 days then recheck.  If new symptoms as we discussed suggestive of shingles call the after-hours line  Zaydrian "Jorja Loa" was seen today for rash.  Diagnoses and all orders for this visit:  Contact dermatitis, unspecified contact dermatitis type, unspecified trigger  Rash  Other orders -     hydrOXYzine (ATARAX) 10 MG tablet; Take 1 tablet (10 mg total) by mouth 3 (three) times daily as needed.    Follow up: prn

## 2023-06-09 ENCOUNTER — Encounter: Payer: Self-pay | Admitting: Internal Medicine

## 2023-09-29 ENCOUNTER — Ambulatory Visit

## 2023-09-29 VITALS — BP 106/70 | HR 99 | Temp 98.2°F | Ht 70.5 in | Wt 166.4 lb

## 2023-09-29 DIAGNOSIS — Z0000 Encounter for general adult medical examination without abnormal findings: Secondary | ICD-10-CM | POA: Diagnosis not present

## 2023-09-29 NOTE — Patient Instructions (Signed)
 [name] [name] , Thank you for taking time out of your busy schedule to complete your Annual Wellness Visit with me. I enjoyed our conversation and look forward to speaking with you again next year. I, as well as your care team,  appreciate your ongoing commitment to your health goals. Please review the following plan we discussed and let me know if I can assist you in the future. Your Game plan/ To Do List    Referrals: If you haven't heard from the office you've been referred to, please reach out to them at the phone provided.  N/a Follow up Visits: Next Medicare AWV with our clinical staff: 10/04/2024 at 9:30   Have you seen your provider in the last 6 months (3 months if uncontrolled diabetes)? No Next Office Visit with your provider: 10/07/2023 at 11:45  Clinician Recommendations:  Aim for 30 minutes of exercise or brisk walking, 6-8 glasses of water, and 5 servings of fruits and vegetables each day.       This is a list of the screening recommended for you and due dates:  Health Maintenance  Topic Date Due   COVID-19 Vaccine (1) Never done   HIV Screening  Never done   Hepatitis C Screening  Never done   Zoster (Shingles) Vaccine (1 of 2) Never done   DTaP/Tdap/Td vaccine (2 - Td or Tdap) 04/24/2020   Flu Shot  11/13/2023   Colon Cancer Screening  04/24/2024   Medicare Annual Wellness Visit  09/28/2024   HPV Vaccine  Aged Out   Meningitis B Vaccine  Aged Out    Advanced directives: (Declined) Advance directive discussed with you today. Even though you declined this today, please call our office should you change your mind, and we can give you the proper paperwork for you to fill out. Advance Care Planning is important because it:  [x]  Makes sure you receive the medical care that is consistent with your values, goals, and preferences  [x]  It provides guidance to your family and loved ones and reduces their decisional burden about whether or not they are making the right decisions  based on your wishes.  Follow the link provided in your after visit summary or read over the paperwork we have mailed to you to help you started getting your Advance Directives in place. If you need assistance in completing these, please reach out to us  so that we can help you!  See attachments for Preventive Care and Fall Prevention Tips.

## 2023-09-29 NOTE — Progress Notes (Signed)
 Subjective:   [name] [name] is a 58 y.o. who presents for a Medicare Wellness preventive visit.  As a reminder, Annual Wellness Visits don't include a physical exam, and some assessments may be limited, especially if this visit is performed virtually. We may recommend an in-person follow-up visit with your provider if needed.  Visit Complete: In person    Persons Participating in Visit: Patient.  AWV Questionnaire: No: Patient Medicare AWV questionnaire was not completed prior to this visit.  Cardiac Risk Factors include: advanced age (>47men, >40 women);male gender     Objective:    Today's Vitals   09/29/23 0937 09/29/23 0938  BP: 106/70   Pulse: 99   Temp: 98.2 F (36.8 C)   TempSrc: Oral   SpO2: 96%   Weight: 166 lb 6.4 oz (75.5 kg)   Height: 5' 10.5 (1.791 m)   PainSc:  6    Body mass index is 23.54 kg/m.     09/29/2023    9:45 AM 02/22/2021    1:47 PM 07/16/2018   11:14 AM 06/26/2015    1:25 PM 01/05/2014    9:13 AM 12/22/2013    1:51 PM 11/03/2012    4:10 PM  Advanced Directives  Does Patient Have a Medical Advance Directive? No No No Yes  No  No  Patient does not have advance directive   Type of Insurance risk surveyor Power of [place];Living will      Copy of Healthcare Power of Attorney in Chart?    No - copy requested      Would patient like information on creating a medical advance directive? No - Patient declined Yes (MAU/Ambulatory/Procedural Areas - Information given) Yes (MAU/Ambulatory/Procedural Areas - Information given)          Data saved with a previous flowsheet row definition    Current Medications (verified) Outpatient Encounter Medications as of 09/29/2023  Medication Sig   hydrOXYzine  (ATARAX ) 10 MG tablet Take 1 tablet (10 mg total) by mouth 3 (three) times daily as needed.   clotrimazole -betamethasone  (LOTRISONE ) cream Apply 1 application topically daily.   No facility-administered encounter medications on file as of  09/29/2023.    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    Other postprocedural status(V45.89)    inguinal herniorrhaphies bilateral    Sleep apnea    cpap   Testosterone  deficiency 11/04/11   Past Surgical History:  Procedure Laterality Date   HERNIA REPAIR Bilateral 1971   LAPAROSCOPIC CHOLECYSTECTOMY  2014   Family History  Problem Relation Age of Onset   Arthritis Mother    Colon cancer Neg Hx    Esophageal cancer Neg Hx    Rectal cancer Neg Hx    Stomach cancer Neg Hx    Allergic rhinitis Neg Hx    Angioedema Neg Hx    Asthma Neg Hx    Immunodeficiency Neg Hx    Urticaria Neg Hx    Eczema Neg Hx    Atopy Neg Hx    Social History   Socioeconomic History   Marital status: Divorced    Spouse name: Not on file   Number of children: 0   Years of education: Not on file  Highest education level: Not on file  Occupational History   Occupation: retired/disabilty  Tobacco Use   Smoking status: Former    Current packs/day: 0.00    Types: Cigarettes    Quit date: 07/14/2010    Years since quitting: 13.2   Smokeless tobacco: Never  Vaping Use   Vaping status: Never Used  Substance and Sexual Activity   Alcohol use: No   Drug use: No   Sexual activity: Not Currently  Other Topics Concern   Not on file  Social History Narrative   Not on file   Social Drivers of Health   Financial Resource Strain: Low Risk  (09/29/2023)   Overall Financial Resource Strain (CARDIA)    Difficulty of Paying Living Expenses: Not hard at all  Food Insecurity: No Food Insecurity (09/29/2023)   Hunger Vital Sign    Worried About Running Out of Food in the Last Year: Never true    Ran Out of Food in the Last Year: Never true   Transportation Needs: No Transportation Needs (09/29/2023)   PRAPARE - Administrator, Civil Service (Medical): No    Lack of Transportation (Non-Medical): No  Physical Activity: Inactive (09/29/2023)   Exercise Vital Sign    Days of Exercise per Week: 0 days    Minutes of Exercise per Session: 0 min  Stress: No Stress Concern Present (09/29/2023)   [employer] of Occupational Health - Occupational Stress Questionnaire    Feeling of Stress: Not at all  Social Connections: Socially Isolated (09/29/2023)   Social Connection and Isolation Panel    Frequency of Communication with Friends and Family: More than three times a week    Frequency of Social Gatherings with Friends and Family: Once a week    Attends Religious Services: Never    Database administrator or Organizations: No    Attends Engineer, structural: Never    Marital Status: Divorced    Tobacco Counseling Counseling given: Not Answered    Clinical Intake:  Pre-visit preparation completed: Yes  Pain : 0-10 Pain Score: 6  Pain Type: Chronic pain Pain Location: Hip Pain Orientation: Right Pain Descriptors / Indicators: Aching Pain Onset: More than a month ago Pain Frequency: Constant     Nutritional Risks: None Diabetes: No  Lab Results  Component Value Date   HGBA1C 5.3 11/03/2012     How often do you need to have someone help you when you read instructions, pamphlets, or other written materials from your doctor or pharmacy?: 1 - Never  Interpreter Needed?: No  Information entered by :: NAllen LPN   Activities of Daily Living     09/29/2023    9:40 AM  In your present state of health, do you have any difficulty performing the following activities:  Hearing? 0  Vision? 0  Difficulty concentrating or making decisions? 0  Walking or climbing stairs? 1  Comment due to hip  Dressing or bathing? 0  Doing errands, shopping? 0  Preparing Food and eating ? N  Using the Toilet? N   In the past six months, have you accidently leaked urine? N  Do you have problems with loss of bowel control? N  Managing your Medications? N  Managing your Finances? N  Housekeeping or managing your Housekeeping? N    Patient Care Team: Watson Hacking, MD as PCP - General (Family Medicine)  I have updated your Care Teams any recent Medical Services you may have received from other  providers in the past year.     Assessment:   This is a routine wellness examination for [name] [name].  Hearing/Vision screen Hearing Screening - Comments:: Denies hearing issues Vision Screening - Comments:: No regular eye exams,    Goals Addressed             This Visit's Progress    Patient Stated       09/29/2023, get hip taken care of       Depression Screen     09/29/2023    9:47 AM 02/22/2021    1:49 PM 07/16/2018   10:15 AM 06/26/2015   10:39 AM 10/21/2013    9:40 AM  PHQ 2/9 Scores  PHQ - 2 Score 0 0 0 0 2  PHQ- 9 Score 0        Fall Risk     09/29/2023    9:46 AM 02/22/2021    1:48 PM 07/16/2018   10:15 AM 06/26/2015   10:39 AM  Fall Risk   Falls in the past year? 1 0 0  No   Comment tripped     Number falls in past yr: 0     Injury with Fall? 0     Risk for fall due to : Medication side effect;Impaired mobility;Impaired balance/gait Impaired mobility;Impaired balance/gait    Follow up Falls evaluation completed;Falls prevention discussed Falls evaluation completed;Education provided;Falls prevention discussed        Data saved with a previous flowsheet row definition    MEDICARE RISK AT HOME:  Medicare Risk at Home Any stairs in or around the home?: Yes If so, are there any without handrails?: No Home free of loose throw rugs in walkways, pet beds, electrical cords, etc?: Yes Adequate lighting in your home to reduce risk of falls?: Yes Life alert?: No Use of a cane, walker or w/c?: Yes Grab bars in the bathroom?: No Shower chair or bench in shower?: No Elevated toilet  seat or a handicapped toilet?: No  TIMED UP AND GO:  Was the test performed?  Yes  Length of time to ambulate 10 feet: 7 sec Gait slow and steady with assistive device  Cognitive Function: 6CIT completed        09/29/2023    9:48 AM 02/22/2021    1:51 PM  6CIT Screen  What Year? 0 points 0 points  What month? 0 points 0 points  What time? 0 points 0 points  Count back from 20 0 points 0 points  Months in reverse 0 points 0 points  Repeat phrase 0 points 2 points  Total Score 0 points 2 points    Immunizations Immunization History  Administered Date(s) Administered   Influenza,inj,Quad PF,6+ Mos 12/15/2018   Tdap 04/24/2010    Screening Tests Health Maintenance  Topic Date Due   COVID-19 Vaccine (1) Never done   HIV Screening  Never done   Hepatitis C Screening  Never done   Zoster Vaccines- Shingrix (1 of 2) Never done   DTaP/Tdap/Td (2 - Td or Tdap) 04/24/2020   INFLUENZA VACCINE  11/13/2023   Colonoscopy  04/24/2024   Medicare Annual Wellness (AWV)  09/28/2024   HPV VACCINES  Aged Out   Meningococcal B Vaccine  Aged Out    Health Maintenance  Health Maintenance Due  Topic Date Due   COVID-19 Vaccine (1) Never done   HIV Screening  Never done   Hepatitis C Screening  Never done   Zoster Vaccines- Shingrix (1 of 2) Never done  DTaP/Tdap/Td (2 - Td or Tdap) 04/24/2020   Health Maintenance Items Addressed: Declines vaccines. Due for Hep C and HIV screenings  Additional Screening:  Vision Screening: Recommended annual ophthalmology exams for early detection of glaucoma and other disorders of the eye. Would you like a referral to an eye doctor? No    Dental Screening: Recommended annual dental exams for proper oral hygiene  Community Resource Referral / Chronic Care Management: CRR required this visit?  No   CCM required this visit?  No   Plan:    I have personally reviewed and noted the following in the patient's chart:   Medical and social  history Use of alcohol, tobacco or illicit drugs  Current medications and supplements including opioid prescriptions. Patient is not currently taking opioid prescriptions. Functional ability and status Nutritional status Physical activity Advanced directives List of other physicians Hospitalizations, surgeries, and ER visits in previous 12 months Vitals Screenings to include cognitive, depression, and falls Referrals and appointments  In addition, I have reviewed and discussed with patient certain preventive protocols, quality metrics, and best practice recommendations. A written personalized care plan for preventive services as well as general preventive health recommendations were provided to patient.   Areatha Beecham, LPN   1/61/0960   After Visit Summary: (In Person-Printed) AVS printed and given to the patient  Notes: Nothing significant to report at this time.

## 2023-10-07 ENCOUNTER — Ambulatory Visit: Admitting: Family Medicine

## 2023-10-07 VITALS — BP 116/70 | HR 73 | Wt 166.2 lb

## 2023-10-07 DIAGNOSIS — M7061 Trochanteric bursitis, right hip: Secondary | ICD-10-CM

## 2023-10-07 MED ORDER — LIDOCAINE HCL 1 % IJ SOLN
10.0000 mL | Freq: Once | INTRAMUSCULAR | Status: AC
  Administered 2023-10-07: 10 mL via INTRADERMAL

## 2023-10-07 MED ORDER — TRIAMCINOLONE ACETONIDE 40 MG/ML IJ SUSP
40.0000 mg | Freq: Once | INTRAMUSCULAR | Status: AC
  Administered 2023-10-07: 40 mg via INTRAMUSCULAR

## 2023-10-07 NOTE — Progress Notes (Signed)
   Subjective:    Patient ID: [name] [name], male    DOB: 10-Jan-1966, 58 y.o.   MRN: [id number]  HPI He is here for evaluation of right hip discomfort.  He is states that he injured this thing several years ago and has had intermittent difficulty with this.  He has not seen anybody for evaluation of this due to various other issues interfering.   Review of Systems     Objective:    Physical Exam Full motion of the hip without pain.  Tenderness to palpation over the right greater trochanter.  No tenderness over the LS spine.  Slight tenderness over the SI joint on the right.       Assessment & Plan:  Trochanteric bursitis of right hip I discussed the treatment of trochanteric bursitis with him and think that the shot would be valuable.  The point of maximum pain was identified which was slightly posterior to the greater trochanter.  3 cc of Xylocaine and 40 mg of Kenalog was injected into that area.  He is not sure if he has noticed any difference in discomfort.  Explained that I went over the area of maximum pain and tenderness and might need to reevaluate this and possibly do x-rays at that point down the road if he continues to have trouble.

## 2023-10-07 NOTE — Addendum Note (Signed)
 Addended by: LATTIE CARLO BROCKS on: 10/07/2023 02:06 PM   Modules accepted: Orders

## 2023-10-28 ENCOUNTER — Encounter: Payer: Self-pay | Admitting: Family Medicine

## 2023-10-28 ENCOUNTER — Ambulatory Visit: Admitting: Family Medicine

## 2023-10-28 VITALS — BP 120/80 | HR 69 | Ht 69.0 in | Wt 161.2 lb

## 2023-10-28 DIAGNOSIS — Z13220 Encounter for screening for lipoid disorders: Secondary | ICD-10-CM

## 2023-10-28 DIAGNOSIS — R109 Unspecified abdominal pain: Secondary | ICD-10-CM

## 2023-10-28 DIAGNOSIS — Z23 Encounter for immunization: Secondary | ICD-10-CM

## 2023-10-28 DIAGNOSIS — G35 Multiple sclerosis: Secondary | ICD-10-CM | POA: Diagnosis not present

## 2023-10-28 DIAGNOSIS — Q828 Other specified congenital malformations of skin: Secondary | ICD-10-CM | POA: Diagnosis not present

## 2023-10-28 DIAGNOSIS — M25551 Pain in right hip: Secondary | ICD-10-CM

## 2023-10-28 DIAGNOSIS — Z2821 Immunization not carried out because of patient refusal: Secondary | ICD-10-CM

## 2023-10-28 DIAGNOSIS — E291 Testicular hypofunction: Secondary | ICD-10-CM

## 2023-10-28 DIAGNOSIS — Z1159 Encounter for screening for other viral diseases: Secondary | ICD-10-CM | POA: Diagnosis not present

## 2023-10-28 DIAGNOSIS — J3089 Other allergic rhinitis: Secondary | ICD-10-CM

## 2023-10-28 DIAGNOSIS — Z860101 Personal history of adenomatous and serrated colon polyps: Secondary | ICD-10-CM | POA: Diagnosis not present

## 2023-10-28 DIAGNOSIS — Z0000 Encounter for general adult medical examination without abnormal findings: Secondary | ICD-10-CM

## 2023-10-28 DIAGNOSIS — Z136 Encounter for screening for cardiovascular disorders: Secondary | ICD-10-CM | POA: Diagnosis not present

## 2023-10-28 DIAGNOSIS — M8580 Other specified disorders of bone density and structure, unspecified site: Secondary | ICD-10-CM

## 2023-10-28 NOTE — Progress Notes (Signed)
 Complete physical exam  Patient: [name] [name]   DOB: 1965/07/11   58 y.o. Male  MRN: [id number]  Subjective:    Chief Complaint  Patient presents with   Annual Exam    Cpe. Fasting. Follow up on leg. Injection only lasted 3 days. Wants to talk about PT    [name] [name] is a 58 y.o. male who presents today for a complete physical exam.  He reports consuming a general diet. The patient does not participate in regular exercise at present. He generally feels well. He reports sleeping well.  He had an injection recently and it did give him about 4 days worth of benefit.  He states that he is now having pain but in a different area as the shot did help with the initial pain.  He relates this to a fall that he had 5 years ago and continued difficulty with discomfort in that same area.  He has not seen his specialist for the MS in quite some time.  He does have a history of adenomatous colonic polyp and is scheduled for routine follow-up concerning that.  Allergies seem to be under good control.  He also has a history of hypogonadism which does need to be followed up on.  He does complain of some difficulty with tearing especially on the right and plans to follow-up with ophthalmology concerning that.  He also has some skin tags in his axilla that he is concerned about.  Most recent fall risk assessment:    10/28/2023    3:19 PM  Fall Risk   Falls in the past year? 0  Number falls in past yr: 0  Injury with Fall? 0  Risk for fall due to : No Fall Risks  Follow up Falls evaluation completed     Most recent depression screenings:    10/28/2023    3:19 PM 09/29/2023    9:47 AM  PHQ 2/9 Scores  PHQ - 2 Score 0 0  PHQ- 9 Score  0    Vision:Not within last year  and Dental: No current dental problems and No regular dental care     Immunization History  Administered Date(s) Administered   Influenza,inj,Quad PF,6+ Mos 12/15/2018   Tdap 04/24/2010, 10/28/2023    Health Maintenance   Topic Date Due   COVID-19 Vaccine (1) Never done   HIV Screening  Never done   Hepatitis C Screening  Never done   Zoster Vaccines- Shingrix (1 of 2) Never done   INFLUENZA VACCINE  11/13/2023   Colonoscopy  04/24/2024   Medicare Annual Wellness (AWV)  09/28/2024   DTaP/Tdap/Td (3 - Td or Tdap) 10/27/2033   Hepatitis B Vaccines  Aged Out   HPV VACCINES  Aged Out   Meningococcal B Vaccine  Aged Out    Patient Care Team: Joyce Norleen BROCKS, MD as PCP - General (Family Medicine)   Outpatient Medications Prior to Visit  Medication Sig   clotrimazole -betamethasone  (LOTRISONE ) cream Apply 1 application topically daily.   hydrOXYzine  (ATARAX ) 10 MG tablet Take 1 tablet (10 mg total) by mouth 3 (three) times daily as needed. (Patient not taking: Reported on 10/28/2023)   No facility-administered medications prior to visit.    Review of Systems  All other systems reviewed and are negative.   Family and social history as well as health maintenance and immunizations was reviewed.     Objective:    BP 120/80   Pulse 69   Ht 5' 9 (1.753 m)  Wt 161 lb 3.2 oz (73.1 kg)   SpO2 98%   BMI 23.81 kg/m    Physical Exam  Alert and in no distress. Tympanic membranes and canals are normal. Pharyngeal area is normal. Neck is supple without adenopathy or thyromegaly. Cardiac exam shows a regular sinus rhythm without murmurs or gallops. Lungs are clear to auscultation.  Abdominal exam shows no masses or tenderness.  Exam of the the hip shows excellent range of motion.  No tenderness to palpation in the lumbar spine area with relatively normal lumbar curve and motion.  DTRs are 2-3+.  Several skin tags noted in both axilla.      Assessment & Plan:    Routine general medical examination at a health care facility  Other allergic rhinitis - Plan: CBC with Differential, Comprehensive metabolic panel, CANCELED: CBC with Differential/Platelet, CANCELED: Comprehensive metabolic panel with  GFR  Multiple sclerosis (HCC)  Hypogonadism male - Plan: Testosterone , CANCELED: Testosterone   History of adenomatous polyp of colon  Need for hepatitis C screening test - Plan: Hepatitis C antibody, CANCELED: Hepatitis C antibody  Need for Tdap vaccination - Plan: Tdap vaccine greater than or equal to 7yo IM  Screening for lipid disorders - Plan: Lipid panel, CANCELED: Lipid panel  Immunization refused  Right hip pain - Plan: DG Hip Unilat W OR W/O Pelvis 1V Right  Accessory skin tags  No therapy for the skin tags.  Encouraged him to call his neurologist for follow-up concerning his MS.  Will do routine blood screening on him.  He wanted to hold off on getting the shingles and pneumonia shots.  Follow-up after x-rays for possible referral to orthopedics. No follow-ups on file.      Norleen Jobs, MD

## 2023-10-29 ENCOUNTER — Ambulatory Visit: Payer: Self-pay | Admitting: Family Medicine

## 2023-10-29 DIAGNOSIS — R102 Pelvic and perineal pain: Secondary | ICD-10-CM

## 2023-10-29 LAB — CBC WITH DIFFERENTIAL/PLATELET
Basophils Absolute: 0.1 x10E3/uL (ref 0.0–0.2)
Basophils Absolute: 0 x10E3/uL (ref 0.0–0.2)
Basos: 1 %
Basos: 1 %
EOS (ABSOLUTE): 0.1 x10E3/uL (ref 0.0–0.4)
EOS (ABSOLUTE): 0.2 x10E3/uL (ref 0.0–0.4)
Eos: 2 %
Eos: 2 %
Hematocrit: 43.5 % (ref 37.5–51.0)
Hematocrit: 45.4 % (ref 37.5–51.0)
Hemoglobin: 13.9 g/dL (ref 13.0–17.7)
Hemoglobin: 14.6 g/dL (ref 13.0–17.7)
Immature Grans (Abs): 0 x10E3/uL (ref 0.0–0.1)
Immature Grans (Abs): 0 x10E3/uL (ref 0.0–0.1)
Immature Granulocytes: 0 %
Immature Granulocytes: 0 %
Lymphocytes Absolute: 2.3 x10E3/uL (ref 0.7–3.1)
Lymphocytes Absolute: 1.7 x10E3/uL (ref 0.7–3.1)
Lymphs: 33 %
Lymphs: 21 %
MCH: 29 pg (ref 26.6–33.0)
MCH: 29.9 pg (ref 26.6–33.0)
MCHC: 32 g/dL (ref 31.5–35.7)
MCHC: 32.2 g/dL (ref 31.5–35.7)
MCV: 91 fL (ref 79–97)
MCV: 93 fL (ref 79–97)
Monocytes Absolute: 0.7 x10E3/uL (ref 0.1–0.9)
Monocytes Absolute: 0.6 x10E3/uL (ref 0.1–0.9)
Monocytes: 10 %
Monocytes: 8 %
Neutrophils Absolute: 3.7 x10E3/uL (ref 1.4–7.0)
Neutrophils Absolute: 5.5 x10E3/uL (ref 1.4–7.0)
Neutrophils: 54 %
Neutrophils: 68 %
Platelets: 226 x10E3/uL (ref 150–450)
Platelets: 291 x10E3/uL (ref 150–450)
RBC: 4.8 x10E6/uL (ref 4.14–5.80)
RBC: 4.89 x10E6/uL (ref 4.14–5.80)
RDW: 13.1 % (ref 11.6–15.4)
RDW: 13 % (ref 11.6–15.4)
WBC: 6.8 x10E3/uL (ref 3.4–10.8)
WBC: 8.1 x10E3/uL (ref 3.4–10.8)

## 2023-10-29 LAB — COMPREHENSIVE METABOLIC PANEL WITH GFR
ALT: 14 IU/L (ref 0–44)
ALT: 14 IU/L (ref 0–44)
AST: 17 IU/L (ref 0–40)
AST: 17 IU/L (ref 0–40)
Albumin: 4.5 g/dL (ref 3.8–4.9)
Albumin: 4.5 g/dL (ref 3.8–4.9)
Alkaline Phosphatase: 52 IU/L (ref 44–121)
Alkaline Phosphatase: 52 IU/L (ref 44–121)
BUN/Creatinine Ratio: 29 — ABNORMAL HIGH (ref 10–24)
BUN/Creatinine Ratio: 29 — ABNORMAL HIGH (ref 10–24)
BUN: 24 mg/dL (ref 8–27)
BUN: 23 mg/dL (ref 8–27)
Bilirubin Total: 0.3 mg/dL (ref 0.0–1.2)
Bilirubin Total: 0.3 mg/dL (ref 0.0–1.2)
CO2: 20 mmol/L (ref 20–29)
CO2: 19 mmol/L — ABNORMAL LOW (ref 20–29)
Calcium: 9.4 mg/dL (ref 8.6–10.2)
Calcium: 9.3 mg/dL (ref 8.6–10.2)
Chloride: 106 mmol/L (ref 96–106)
Chloride: 106 mmol/L (ref 96–106)
Creatinine, Ser: 0.83 mg/dL (ref 0.76–1.27)
Creatinine, Ser: 0.8 mg/dL (ref 0.76–1.27)
Globulin, Total: 2.8 g/dL (ref 1.5–4.5)
Globulin, Total: 2.8 g/dL (ref 1.5–4.5)
Glucose: 91 mg/dL (ref 70–99)
Glucose: 92 mg/dL (ref 70–99)
Potassium: 4.1 mmol/L (ref 3.5–5.2)
Potassium: 4.1 mmol/L (ref 3.5–5.2)
Sodium: 142 mmol/L (ref 134–144)
Sodium: 141 mmol/L (ref 134–144)
Total Protein: 7.3 g/dL (ref 6.0–8.5)
Total Protein: 7.3 g/dL (ref 6.0–8.5)
eGFR: 100 mL/min/1.73 (ref >59)
eGFR: 101 mL/min/1.73 (ref >59)

## 2023-10-29 LAB — SPECIMEN STATUS REPORT

## 2023-10-29 LAB — LIPID PANEL
Chol/HDL Ratio: 3.9 ratio (ref 0.0–5.0)
Chol/HDL Ratio: 3.6 ratio (ref 0.0–5.0)
Cholesterol, Total: 202 mg/dL — ABNORMAL HIGH (ref 100–199)
Cholesterol, Total: 208 mg/dL — ABNORMAL HIGH (ref 100–199)
HDL: 52 mg/dL (ref >39)
HDL: 57 mg/dL (ref >39)
LDL Chol Calc (NIH): 133 mg/dL — ABNORMAL HIGH (ref 0–99)
LDL Chol Calc (NIH): 134 mg/dL — ABNORMAL HIGH (ref 0–99)
Triglycerides: 96 mg/dL (ref 0–149)
Triglycerides: 94 mg/dL (ref 0–149)
VLDL Cholesterol Cal: 17 mg/dL (ref 5–40)
VLDL Cholesterol Cal: 17 mg/dL (ref 5–40)

## 2023-10-29 LAB — TESTOSTERONE
Testosterone: 256 ng/dL — ABNORMAL LOW (ref 264–916)
Testosterone: 233 ng/dL — ABNORMAL LOW (ref 264–916)

## 2023-10-29 LAB — HEPATITIS C ANTIBODY
Hep C Virus Ab: NONREACTIVE
Hep C Virus Ab: NONREACTIVE

## 2023-11-02 ENCOUNTER — Ambulatory Visit
Admission: RE | Admit: 2023-11-02 | Discharge: 2023-11-02 | Disposition: A | Discharge status: Home / Self Care | Source: Ambulatory Visit | Attending: Family Medicine | Admitting: Family Medicine

## 2023-11-02 DIAGNOSIS — M25551 Pain in right hip: Secondary | ICD-10-CM | POA: Diagnosis not present

## 2023-11-09 NOTE — Addendum Note (Signed)
 Addended by: JOYCE NORLEEN BROCKS on: 11/09/2023 03:14 AM   Modules accepted: Orders

## 2023-11-13 NOTE — Addendum Note (Signed)
 Addended by: VICCI HUSBAND A on: 11/13/2023 03:47 PM   Modules accepted: Orders

## 2023-11-30 ENCOUNTER — Ambulatory Visit (INDEPENDENT_AMBULATORY_CARE_PROVIDER_SITE_OTHER): Admitting: Sports Medicine

## 2023-11-30 DIAGNOSIS — M67951 Unspecified disorder of synovium and tendon, right thigh: Secondary | ICD-10-CM

## 2023-11-30 DIAGNOSIS — G8929 Other chronic pain: Secondary | ICD-10-CM

## 2023-11-30 DIAGNOSIS — R29898 Other symptoms and signs involving the musculoskeletal system: Secondary | ICD-10-CM | POA: Diagnosis not present

## 2023-11-30 DIAGNOSIS — M21371 Foot drop, right foot: Secondary | ICD-10-CM

## 2023-11-30 DIAGNOSIS — M25551 Pain in right hip: Secondary | ICD-10-CM | POA: Diagnosis not present

## 2023-11-30 DIAGNOSIS — M7989 Other specified soft tissue disorders: Secondary | ICD-10-CM | POA: Diagnosis not present

## 2023-11-30 NOTE — Progress Notes (Signed)
 Patient says that he has right hip pain that seems to wrap around the entire hip and entire right buttock. He did have a bad fall about 5 years ago that left him very bruised and seems to be related to the pain he is having now. He does have MS and has been told in the past that this pain may be related to that, although he does feel that this is different pain. He says that the MS seems to impact the right lower leg and right foot the most, and that he does have a foot drop. He denies any popping, clicking, or pulling in the hip. He does not take any medication for this pain, and has also stopped his MS medication that he was on previously.

## 2023-11-30 NOTE — Progress Notes (Signed)
 [name] [name] - 58 y.o. male MRN [id number]  Date of birth: 1966-01-22  Office Visit Note: Visit Date: 11/30/2023 PCP: Joyce Norleen BROCKS, MD Referred by: Joyce Norleen BROCKS, MD  Subjective: Chief Complaint  Patient presents with   Right Hip - Pain   HPI: [name] [name] is a pleasant 58 y.o. male who presents today for acute on chronic right hip pain.  He has had chronic right hip as well as thigh/buttock pain for the last few years.  He does feel this is related to a bad fall that he had about 5 years ago that left him significantly bruised over the entirety of the lateral leg and thigh.  He did see his PCP at this time who reassured him that it would take time for the body to reabsorb his extensive bruising and soreness.  His pain has waxed and waned since then but is never quite been the same and does feel like this affects how he walks.  He does have concomitant multiple sclerosis and some physicians chalked up his pain to being from this.  He has a chronic right foot drop associated with this but does feel his gait is different in spite of this.  His pain is localized over the lateral aspect of the hip that goes down the thigh but also feels pain in the posterior and anterior hip as well.  He is not taking any medication for this currently.  Did have palpation guided greater trochanteric injection on 10/07/2023 without relief per patient.  Pertinent ROS were reviewed with the patient and found to be negative unless otherwise specified above in HPI.   Assessment & Plan: Visit Diagnoses:  1. Chronic right hip pain   2. Weakness of right hip   3. Tendinopathy of right gluteal region   4. Soft tissue calcification   5. Right foot drop    Plan: Impression is chronic right hip and thigh pain which is likely multifactorial.  He does have evidence of greater trochanteric pain syndrome with gluteal insufficiency.  Given this, I would like to get him started in formalized physical therapy to work  on his hip abductors and pelvic stability musculature.  He does have some proximal muscle weakness from his MS that is also associated.  His x-rays do show some calcification likely in the soft tissue versus hip joint on his lateral x-rays.  Given his overall history from previous traumatic fall, I am concerned for possible myositis ossificans.  We will move forward with MRI to further evaluate this as well as the quality of his gluteal tendons given his ongoing pain, to help guide treatment.  We will follow-up 1 week after MRI to review and discuss next steps.  Okay for over-the-counter anti-inflammatories only as needed.  Additional treatment considerations: Extracorporeal shockwave therapy  Follow-up: Return for f/u 1-week after MRI .   Meds & Orders: No orders of the defined types were placed in this encounter.   Orders Placed This Encounter  Procedures   MR Hip Right w/o contrast   Ambulatory referral to Physical Therapy     Procedures: No procedures performed      Clinical History: No specialty comments available.  He reports that he quit smoking about 13 years ago. His smoking use included cigarettes. He has never used smokeless tobacco. No results for input(s): HGBA1C, LABURIC in the last 8760 hours.  Objective:    Physical Exam  Gen: Well-appearing, in no acute distress; non-toxic CV: Well-perfused. Warm.  Resp:  Breathing unlabored on room air; no wheezing. Psych: Fluid speech in conversation; appropriate affect; normal thought process  Ortho Exam - Hips: There is a degree of proximal muscle atrophy bilaterally.  There is positive TTP over the greater trochanteric region and just posterior to this near the mid body of the gluteal tendons and near the insertion.  There is weakness with hip abduction bilaterally although the right is more significant than left.  No significant swelling noted.  - Gait analysis: There is evidence of chronic right foot drop, the patient does  swing the leg externally to help compensate for the hip/foot drop and Trendelenburg gait.  Imaging:  *I did independently review and interpret the right hip x-rays, 2 views from 11/02/2023.  There is only minimal arthritic change within the hip joint.  There is calcification and spurring off the superior aspect of bilateral greater trochanteric regions.  On the lateral film of the right hip there is some scattered calcification inferior to the femoral head and neck juncture, this does not appear to emanate from the hip joint, could be evidence of myositis ossificans given his history but would be better evaluated with MRI.  DG HIP UNILAT WITH PELVIS 2-3 VIEWS RIGHT CLINICAL DATA:  Chronic right hip pain.  EXAM: DG HIP (WITH OR WITHOUT PELVIS) 2-3V RIGHT  COMPARISON:  None Available.  FINDINGS: Osseous structures are osteopenic. There is no evidence of hip fracture or dislocation. There is no evidence of arthropathy or other focal bone abnormality.  IMPRESSION: Osteopenia. No acute osseous abnormalities.  Electronically Signed   By: Fonda Field M.D.   On: 11/08/2023 11:48    Past Medical/Family/Surgical/Social History: Medications & Allergies reviewed per EMR, new medications updated. 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    Other postprocedural status(V45.89)    inguinal herniorrhaphies bilateral    Sleep apnea    cpap   Testosterone   deficiency 11/04/11   Family History  Problem Relation Age of Onset   Arthritis Mother    Colon cancer Neg Hx    Esophageal cancer Neg Hx    Rectal cancer Neg Hx    Stomach cancer Neg Hx    Allergic rhinitis Neg Hx    Angioedema Neg Hx    Asthma Neg Hx    Immunodeficiency Neg Hx    Urticaria Neg Hx    Eczema Neg Hx    Atopy Neg Hx    Past Surgical History:  Procedure Laterality Date   HERNIA REPAIR Bilateral 1971   LAPAROSCOPIC CHOLECYSTECTOMY  2014   Social History   Occupational History   Occupation: retired/disabilty  Tobacco Use   Smoking status: Former    Current packs/day: 0.00    Types: Cigarettes    Quit date: 07/14/2010    Years since quitting: 13.3   Smokeless tobacco: Never  Vaping Use   Vaping status: Never Used  Substance and Sexual Activity  Alcohol use: No   Drug use: No   Sexual activity: Not Currently

## 2023-12-02 ENCOUNTER — Ambulatory Visit
Admission: RE | Admit: 2023-12-02 | Discharge: 2023-12-02 | Disposition: A | Discharge status: Home / Self Care | Source: Ambulatory Visit | Attending: Sports Medicine | Admitting: Sports Medicine

## 2023-12-02 DIAGNOSIS — M7989 Other specified soft tissue disorders: Secondary | ICD-10-CM

## 2023-12-02 DIAGNOSIS — M1611 Unilateral primary osteoarthritis, right hip: Secondary | ICD-10-CM | POA: Diagnosis not present

## 2023-12-02 DIAGNOSIS — M67951 Unspecified disorder of synovium and tendon, right thigh: Secondary | ICD-10-CM

## 2023-12-02 DIAGNOSIS — M7601 Gluteal tendinitis, right hip: Secondary | ICD-10-CM | POA: Diagnosis not present

## 2023-12-02 DIAGNOSIS — G8929 Other chronic pain: Secondary | ICD-10-CM

## 2023-12-02 DIAGNOSIS — R29898 Other symptoms and signs involving the musculoskeletal system: Secondary | ICD-10-CM

## 2023-12-16 NOTE — Telephone Encounter (Signed)
 Called patient and scheduled 30-min MRI review tomorrow, 12/17/2023 at 8:15am.

## 2023-12-17 ENCOUNTER — Encounter: Payer: Self-pay | Admitting: Sports Medicine

## 2023-12-17 ENCOUNTER — Ambulatory Visit: Admitting: Sports Medicine

## 2023-12-17 DIAGNOSIS — G35 Multiple sclerosis: Secondary | ICD-10-CM

## 2023-12-17 DIAGNOSIS — M67951 Unspecified disorder of synovium and tendon, right thigh: Secondary | ICD-10-CM

## 2023-12-17 DIAGNOSIS — M7989 Other specified soft tissue disorders: Secondary | ICD-10-CM | POA: Diagnosis not present

## 2023-12-17 DIAGNOSIS — M25551 Pain in right hip: Secondary | ICD-10-CM | POA: Diagnosis not present

## 2023-12-17 DIAGNOSIS — S76211A Strain of adductor muscle, fascia and tendon of right thigh, initial encounter: Secondary | ICD-10-CM

## 2023-12-17 DIAGNOSIS — G8929 Other chronic pain: Secondary | ICD-10-CM | POA: Diagnosis not present

## 2023-12-17 NOTE — Progress Notes (Signed)
 [name] [name] - 58 y.o. male MRN [id number]  Date of birth: 1965/10/13  Office Visit Note: Visit Date: 12/17/2023 PCP: [name] Norleen BROCKS, MD Referred by: [name] Norleen BROCKS, MD  Subjective: Chief Complaint  Patient presents with   Right Hip - Follow-up   HPI: [name] [name] is a pleasant 58 y.o. male who presents today for follow-up of chronic right hip pain.  [name] states the hip is feeling about the same.  Continues with pain over the lateral aspect of the hip.  He also gets pain within the adductor region but not really the anterior or deep hip.  Occasionally he feels some discomfort on the posterior hip/buttock region.  He does have his upcoming physical therapy appointment on Monday.  He states his pain feels more muscular than anything.  He denies any clicking catching or grinding within the joint.  Pertinent ROS were reviewed with the patient and found to be negative unless otherwise specified above in HPI.   Assessment & Plan: Visit Diagnoses:  1. Tendinopathy of right gluteal region   2. Chronic right hip pain   3. Multiple sclerosis (HCC)   4. Strain of adductor magnus muscle of right lower extremity, initial encounter   5. Soft tissue calcification    Plan: Impression is chronic right hip pain and weakness which is multifactorial in nature. We did review MRI which shows insertional tendinopathy of the right gluteus medius which he does have associated pain and hip abduction insufficiency on exam.  I do believe this is the main component of his pain, as well as some soft tissue calcification from resolving hematoma from his prior fall.  He does have some edema within the adductor region which is unlikely from injury but more so from muscular change surrounding his MS. we did target both of these areas however with extracorporeal shockwave therapy, patient tolerated well. I would like to perform 2 additional treatments and see what sort cumulative benefit he has going forward.  We will  pare this with formalized physical therapy to work on hip stabilization and strengthening about the hip joint and abduction.  He is agreeable to this plan.  MRI does reveal a degenerative labral tear with mild hip OA, but his exam and symptomatology does not suggest this is the main driver of his pain.  Follow-up: Return for make 2 appts about 1-week apart (R-hip).   Meds & Orders: No orders of the defined types were placed in this encounter.  No orders of the defined types were placed in this encounter.    Procedures: Procedure: ECSWT Indications: Adductor strain, GTPS   Procedure Details Consent: Risks of procedure as well as the alternatives and risks of each were explained to the patient.  Verbal consent for procedure obtained. Time Out: Verified patient identification, verified procedure, site was marked, verified correct patient position. The area was cleaned with alcohol swab.     The right adductor region was targeted for Extracorporeal shockwave therapy.    Preset: Muscular injury Power Level: 110 mJ Frequency: 12 Hz Impulse/cycles: 1500 Head size: Regular  The right greater trochanteric region/gluteal tendons was targeted for Extracorporeal shockwave therapy.    Preset: GTPS Power Level: 110 mJ Frequency: 12 Hz Impulse/cycles: 2500 Head size: Regular   Patient tolerated procedure well without immediate complications.       Clinical History: No specialty comments available.  He reports that he quit smoking about 13 years ago. His smoking use included cigarettes. He has never used smokeless tobacco.  No results for input(s): HGBA1C, LABURIC in the last 8760 hours.  Objective:    Physical Exam  Gen: Well-appearing, in no acute distress; non-toxic CV: Well-perfused. Warm.  Resp: Breathing unlabored on room air; no wheezing. Psych: Fluid speech in conversation; appropriate affect; normal thought process  Ortho Exam - Right hip: + TTP over the greater  trochanteric region and the insertion of the gluteal tendons on the posterior GT.  There is a degree of proximal muscle atrophy bilaterally with weakness of hip flexion and hip abduction bilaterally.  There is fluid range of motion with internal and external logroll.  Negative FADIR test.  Imaging:  *I did independent review of the MRI both individually and with the patient in the room today.  MR Hip Right w/o contrast CLINICAL DATA:  Chronic right hip pain  EXAM: MR OF THE RIGHT HIP WITHOUT CONTRAST  TECHNIQUE: Multiplanar, multisequence MR imaging was performed. No intravenous contrast was administered.  COMPARISON:  X-ray 11/02/2023  FINDINGS: Bones: No acute fracture. No dislocation. No femoral head avascular necrosis. Bony pelvis intact without diastasis. SI joints and pubic symphysis within normal limits. No bone marrow edema. No marrow replacing bone lesion.  Articular cartilage and labrum  Articular cartilage: Mild chondral thinning without focal cartilage defect. No subchondral marrow signal changes.  Labrum: Anterosuperior labrum is torn (series 12, image 10). No paralabral cyst.  Joint or bursal effusion  Joint effusion:  None.  Bursae: No abnormal bursal fluid collection.  Muscles and tendons  Muscles and tendons: Mild tendinosis of the bilateral gluteus medius tendons. The hamstring, iliopsoas, rectus femoris, and adductor tendons appear intact without tear or significant tendinosis. Mild intramuscular edema within the right adductor magnus and brevis muscles at their origin on the right ischiopubic ramus. Otherwise normal muscle bulk and signal intensity.  Other findings  Miscellaneous: No soft tissue edema or fluid collection. No inguinal lymphadenopathy.  IMPRESSION: 1. Mild osteoarthritis of the right hip with anterosuperior labral tear. 2. Mild tendinosis of the bilateral gluteus medius tendons. 3. Mild intramuscular edema within the right  adductor magnus and brevis muscles at their origin on the right ischiopubic ramus, suggestive of a low-grade muscle strain.  Electronically Signed   By: Mabel Converse D.O.   On: 12/16/2023 10:04   Past Medical/Family/Surgical/Social History: Medications & Allergies reviewed per EMR, new medications updated. 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    Other postprocedural status(V45.89)    inguinal herniorrhaphies bilateral    Sleep apnea    cpap   Testosterone  deficiency 11/04/11   Family History  Problem Relation Age of Onset   Arthritis Mother    Colon cancer Neg Hx    Esophageal cancer Neg Hx    Rectal cancer Neg Hx    Stomach cancer Neg Hx    Allergic rhinitis Neg Hx    Angioedema Neg Hx    Asthma Neg Hx  Immunodeficiency Neg Hx    Urticaria Neg Hx    Eczema Neg Hx    Atopy Neg Hx    Past Surgical History:  Procedure Laterality Date   HERNIA REPAIR Bilateral 1971   LAPAROSCOPIC CHOLECYSTECTOMY  2014   Social History   Occupational History   Occupation: retired/disabilty  Tobacco Use   Smoking status: Former    Current packs/day: 0.00    Types: Cigarettes    Quit date: 07/14/2010    Years since quitting: 13.4   Smokeless tobacco: Never  Vaping Use   Vaping status: Never Used  Substance and Sexual Activity   Alcohol use: No   Drug use: No   Sexual activity: Not Currently   I spent 35 minutes in the care of the patient  today including face-to-face time, preparation to see the patient, as well as independent review and interpretation of the MRI with the patient in the room today, discussion on extracorporeal shockwave therapy and indications, guidance on PT/HEP for the above diagnoses.   Lonell Sprang, DO Primary Care Sports Medicine Physician  [facility] - Orthopedics  This note was dictated using Dragon naturally speaking software and may contain errors in syntax, spelling, or content which have not been identified prior to signing this note.

## 2023-12-17 NOTE — Progress Notes (Signed)
 Patient says that his pain and symptoms are the same as last visit. He has his first physical therapy appointment scheduled for Monday, but is wondering if he should move forward with that based on his MRI results.

## 2023-12-18 NOTE — Therapy (Signed)
 OUTPATIENT PHYSICAL THERAPY LOWER EXTREMITY EVALUATION   Patient Name: [name] [name] MRN: [id number] DOB:[birth year], 58 y.o., male Today's Date: 12/21/2023  END OF SESSION:  PT End of Session - 12/21/23 1102     Visit Number 1    Number of Visits 16    Date for PT Re-Evaluation 02/15/24    Authorization Type [employer] MEDICARE $20 COPAY    Progress Note Due on Visit 10    PT Start Time 1104    PT Stop Time 1150    PT Time Calculation (min) 46 min    Activity Tolerance Patient tolerated treatment well    Behavior During Therapy [facility] for tasks assessed/performed          Past Medical History:  Diagnosis Date   Heart murmur    Multiple sclerosis (HCC)    dx 2014   Neuromuscular disorder (HCC) 10/2012   high probability of MS   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    Other postprocedural status(V45.89)    inguinal herniorrhaphies bilateral    Sleep apnea    cpap   Testosterone  deficiency 11/04/11   Past Surgical History:  Procedure Laterality Date   HERNIA REPAIR Bilateral 1971   LAPAROSCOPIC CHOLECYSTECTOMY  2014   Patient Active Problem List   Diagnosis Date Noted   Right radial head fracture 12/19/2021   Right sided abdominal pain 02/04/2019   Former smoker 01/18/2019   Food allergy  10/24/2016   Other allergic rhinitis 10/24/2016   History of adenomatous polyp of colon 10/23/2014   Multiple sclerosis (HCC) 11/03/2012   Hypogonadism male 10/06/2011    PCP: Norleen JAYSON Jobs, MD   REFERRING PROVIDER: Lonell Sprang, DO  REFERRING DIAG: [phone] (ICD-10-CM) - Chronic right hip pain R29.898 (ICD-10-CM) - Weakness of right hip M67.951 (ICD-10-CM) - Tendinopathy of right gluteal region  THERAPY DIAG:  Pain in right hip - Plan: PT plan of care cert/re-cert  Stiffness of right hip, not elsewhere classified - Plan: PT plan of care cert/re-cert  Muscle weakness (generalized) - Plan: PT plan of care cert/re-cert  Other  abnormalities of gait and mobility - Plan: PT plan of care cert/re-cert  Unsteadiness on feet - Plan: PT plan of care cert/re-cert  Rationale for Evaluation and Treatment: Rehabilitation  ONSET DATE: 2 years it was worse then finally went to Dr. Sprang 5 months ago  SUBJECTIVE:   SUBJECTIVE STATEMENT: Because of what has been going on, this leg is weak.   PERTINENT HISTORY: Patient experienced a fall 5 years ago where he had a fall on ice and landed on lateral Rt LE. The fall led to extensive bruising on entire lateral thigh as well as pain with no change over several months. Patient and PCP thought it was an effect from MS (diagnoses in 2014). Patient has received injection for bursitis from PCP within first year with no change in symptoms and is currently undergoing extracorporeal shockwave therapy through Dr. Sprang which is largely improving symptoms. Patient has had no other past surgeries or injuries to Rt LE/hip or surrounding areas.  PAIN:  Are you having pain? Yes: NPRS scale: 1/10 at rest, 12/10 with hard STM  Pain location: Rt lateral hip, adductors/groin Pain description: dull Aggravating factors: deep tissue work  Relieving factors: shock therapy, mobility work   PRECAUTIONS: None  RED FLAGS: None   WEIGHT BEARING RESTRICTIONS: No  FALLS:  Has patient fallen in last 6 months? No  LIVING ENVIRONMENT: Lives with: dog  Lives in: House/apartment Stairs: Yes: External: 2 steps; on right going up Has following equipment at home: Single point cane  OCCUPATION: disability, financial trading  PLOF: Independent and uses single point cane   PATIENT GOALS: to be normal again  NEXT MD VISIT: this Friday and following Friday for shock therapy   OBJECTIVE:  Note: Objective measures were completed at Evaluation unless otherwise noted.  DIAGNOSTIC FINDINGS:  IMPRESSION: Osteopenia. No acute osseous abnormalities.  IMPRESSION: 1. Mild osteoarthritis of the right  hip with anterosuperior labral tear. 2. Mild tendinosis of the bilateral gluteus medius tendons. 3. Mild intramuscular edema within the right adductor magnus and brevis muscles at their origin on the right ischiopubic ramus, suggestive of a low-grade muscle strain.  PATIENT SURVEYS:  PSFS: THE PATIENT SPECIFIC FUNCTIONAL SCALE  Place score of 0-10 (0 = unable to perform activity and 10 = able to perform activity at the same level as before injury or problem)  Activity Date: 12/21/2023    Walking  7    2. Stairs  5    3.     4.      Total Score 6      Total Score = Sum of activity scores/number of activities  Minimally Detectable Change: 3 points (for single activity); 2 points (for average score)  [facility] (nd). The Patient Specific Functional Scale . Retrieved from [email or web address]   COGNITION: Overall cognitive status: Within functional limits for tasks assessed     SENSATION: Light touch: WFL  EDEMA:  Not assessed on eval  MUSCLE LENGTH: Not formally assessed, though hamstring tightness noted with SLR  POSTURE: rounded shoulders, forward head, and increased thoracic kyphosis  PALPATION: Not TTP in Rt glutes or lateral thigh   LOWER EXTREMITY ROM:  ROM Right Eval 12/21/2023 Left Eval 12/21/2023  Hip flexion 2+/5 3+/5  Hip extension 2+/5 3-/5  Hip abduction 3-/5 3-/5 with compensation into hip flexion  Hip adduction    Hip internal rotation    Hip external rotation    Knee flexion 4-/5 4/5  Knee extension 5/5 5/5  Ankle dorsiflexion 3+/5 4-/5  Ankle plantarflexion    Ankle inversion    Ankle eversion     (Blank rows = not tested)  LOWER EXTREMITY MMT:  MMT Right Eval 12/21/2023 Left Eval 12/21/2023  Hip flexion [facility] WFL  Hip extension    Hip abduction    Hip adduction    Hip internal rotation    Hip external rotation    Knee flexion    Knee extension    Ankle dorsiflexion     Ankle plantarflexion    Ankle inversion    Ankle eversion     (Blank rows = not tested)  LOWER EXTREMITY SPECIAL TESTS:  Hip special tests: Belvie (FABER) test: negative and FADIR negative though patient notes muscle tightness in lateral glutes with FABER  FUNCTIONAL TESTS:  5 times sit to stand: 16.34s using bilat UEs and uncontrolled   GAIT: Distance walked: not formally assessed  Assistive device utilized: Single point cane Level of assistance: supervision  Comments: wide BOS, decreased knee flexion throughout bilaterally; will require further in depth assessment  TREATMENT DATE:  12/21/2023 TherEx:  HEP handout provided with patient performing one set of each exercise for appropriate form. Verbal and tactile cues required.   Self-Care:  PT discussed POC, osteopenia and importance of weightbearing and strengthening activities, muscle attachments, imagine results    PATIENT EDUCATION:  Education details: HEP, POC, osteopenia Person educated: Patient Education method: Explanation, Demonstration, Tactile cues, Verbal cues, and Handouts Education comprehension: verbalized understanding, returned demonstration, verbal cues required, tactile cues required, and needs further education  HOME EXERCISE PROGRAM: Access Code: [email or web address] URL: [email or web address] Date: 12/21/2023 Prepared by: Susannah Daring  Exercises - Supine Bridge  - 1 x daily - 7 x weekly - 2 sets - 10 reps - 3s hold - Standing Hip Abduction with Counter Support  - 1 x daily - 7 x weekly - 2 sets - 10 reps - Standing Hip Extension with Counter Support  - 1 x daily - 7 x weekly - 2 sets - 10 reps - Mini Squat with Counter Support  - 1 x daily - 7 x weekly - 2 sets - 10 reps - Seated Hamstring Stretch  - 1 x daily - 7 x weekly - 2 sets - 30s hold  ASSESSMENT:  CLINICAL  IMPRESSION: Patient is a 58 y.o. M who was seen today for physical therapy evaluation and treatment for chronic Rt hip pain with functional mobility deficits, generalized strength deficits, pain/discomfort, abnormal gait patterns, and coordination deficits. Patient is limited due to diagnosis of MS (affecting Rt foot and ankle more than other areas) as well as generalized deconditioning. Patient will benefit from skilled PT to address above noted deficits.   OBJECTIVE IMPAIRMENTS: Abnormal gait, decreased activity tolerance, decreased balance, decreased coordination, decreased endurance, decreased mobility, difficulty walking, decreased ROM, decreased strength, impaired flexibility, improper body mechanics, postural dysfunction, and pain.   ACTIVITY LIMITATIONS: lifting, standing, squatting, stairs, and transfers  PARTICIPATION LIMITATIONS: community activity and occupation  PERSONAL FACTORS: Fitness, Past/current experiences, Time since onset of injury/illness/exacerbation, and 3+ comorbidities: multiple sclerosis, heart murmur, sleep apnea are also affecting patient's functional outcome.   REHAB POTENTIAL: Good  CLINICAL DECISION MAKING: Evolving/moderate complexity  EVALUATION COMPLEXITY: Moderate   GOALS: Goals reviewed with patient? Yes  SHORT TERM GOALS: Target date: 01/11/2024 Patient will show compliance with initial HEP.  Baseline: Goal status: INITIAL  2.  Patient will report pain levels no greater than 4/10 in order to show improved overall quality of life. Baseline:  Goal status: INITIAL   LONG TERM GOALS: Target date: 02/15/2024  Patient will be independent with final HEP in order to maintain and progress upon functional gains made within PT.  Baseline:  Goal status: INITIAL  2.  Patient will report pain levels no greater than 2/10 in order to show improved overall quality of life. Baseline:  Goal status: INITIAL  3.  Patient will increase PSFS to at least 8 in  order to show a significant improvement in subjective disability rating. Baseline:  Goal status: INITIAL  4.  Patient will improve bilat hip abduction MMT to at least 4/5 in order to improve biomechanics with functional mobility. Baseline:  Goal status: INITIAL  5.  Patient will improve bilat hip extension MMT to at least 4/5 in order to improve biomechanics with functional mobility. Baseline:  Goal status: INITIAL  6.  Patient will improve 5xSTS to at least 12s in order to decrease risk of falling.  Baseline:  Goal status: INITIAL   PLAN:  PT FREQUENCY: 1-2x/week  PT DURATION: 8  weeks  PLANNED INTERVENTIONS: 97164- PT Re-evaluation, 97750- Physical Performance Testing, 97110-Therapeutic exercises, 97530- Therapeutic activity, [id number]- Neuromuscular re-education, 97535- Self Care, 02859- Manual therapy, [id number]- Gait training, [phone]- Canalith repositioning, H9716- Electrical stimulation (unattended), [id number]- Electrical stimulation (manual), [id number]- Vasopneumatic device, [id number]- Ultrasound, [id number]- Traction (mechanical), [id number]- Ionotophoresis 4mg /ml Dexamethasone, 20560 (1-2 muscles), 20561 (3+ muscles)- Dry Needling, Patient/Family education, Balance training, Stair training, Taping, Joint mobilization, Joint manipulation, Spinal manipulation, Spinal mobilization, Scar mobilization, Vestibular training, DME instructions, Cryotherapy, and Moist heat  PLAN FOR NEXT SESSION: review HEP, generalized LE strengthening, assessment of gait with and without SPC, balance assessment    Susannah Daring, PT, DPT 12/21/23 4:08 PM   Date of referral: 11/30/2023 Referring provider: Lonell Sprang, DO Referring diagnosis? M25.551,G89.29 (ICD-10-CM) - Chronic right hip pain R29.898 (ICD-10-CM) - Weakness of right hip M67.951 (ICD-10-CM) - Tendinopathy of right gluteal region Treatment diagnosis? (if different than referring diagnosis) M25.551, M25.651, M62.81, R26.89, R26.81  What was this (referring dx) caused  by? Fall and Ongoing Issue  Lysle of Condition: Chronic (continuous duration > 3 months)   Laterality: Rt  Current Functional Measure Score: Patient Specific Functional Scale 6  Objective measurements identify impairments when they are compared to normal values, the uninvolved extremity, and prior level of function.  [x]  Yes  []  No  Objective assessment of functional ability: Moderate functional limitations   Briefly describe symptoms: pain with palpation, weakness, unsteady on feet   How did symptoms start: patient slipped on ice several years ago and symptoms never dissipated  Average pain intensity:  Last 24 hours: 2/10  Past week: 5/10  How often does the pt experience symptoms? Frequently  How much have the symptoms interfered with usual daily activities? Moderately  How has condition changed since care began at this facility? NA - initial visit  In general, how is the patients overall health? Fair   BACK PAIN (STarT Back Screening Tool) No

## 2023-12-21 ENCOUNTER — Ambulatory Visit

## 2023-12-21 DIAGNOSIS — R2689 Other abnormalities of gait and mobility: Secondary | ICD-10-CM

## 2023-12-21 DIAGNOSIS — M6281 Muscle weakness (generalized): Secondary | ICD-10-CM | POA: Diagnosis not present

## 2023-12-21 DIAGNOSIS — R2681 Unsteadiness on feet: Secondary | ICD-10-CM

## 2023-12-21 DIAGNOSIS — M25551 Pain in right hip: Secondary | ICD-10-CM

## 2023-12-21 DIAGNOSIS — M25651 Stiffness of right hip, not elsewhere classified: Secondary | ICD-10-CM | POA: Diagnosis not present

## 2023-12-25 ENCOUNTER — Encounter: Payer: Self-pay | Admitting: Sports Medicine

## 2023-12-25 ENCOUNTER — Ambulatory Visit (INDEPENDENT_AMBULATORY_CARE_PROVIDER_SITE_OTHER): Admitting: Sports Medicine

## 2023-12-25 DIAGNOSIS — M25631 Stiffness of right wrist, not elsewhere classified: Secondary | ICD-10-CM

## 2023-12-25 DIAGNOSIS — M67951 Unspecified disorder of synovium and tendon, right thigh: Secondary | ICD-10-CM | POA: Diagnosis not present

## 2023-12-25 DIAGNOSIS — G8929 Other chronic pain: Secondary | ICD-10-CM | POA: Diagnosis not present

## 2023-12-25 DIAGNOSIS — S52121S Displaced fracture of head of right radius, sequela: Secondary | ICD-10-CM

## 2023-12-25 DIAGNOSIS — M25551 Pain in right hip: Secondary | ICD-10-CM

## 2023-12-25 DIAGNOSIS — S76211A Strain of adductor muscle, fascia and tendon of right thigh, initial encounter: Secondary | ICD-10-CM

## 2023-12-25 NOTE — Progress Notes (Signed)
 [name] [name] - 58 y.o. male MRN [id number]  Date of birth: [birth year]  Office Visit Note: Visit Date: 12/25/2023 PCP: Joyce Norleen BROCKS, MD Referred by: Joyce Norleen BROCKS, MD  Subjective: Chief Complaint  Patient presents with   Right Hip - Follow-up   HPI: [name] [name] is a pleasant 58 y.o. male who presents today for follow-up of chronic right hip pain, also with right wrist/forearm restriction.  Right hip - [name] [name] had a good initial response to our first treatment of extracorporeal shockwave therapy, feels like he is at least 30% better and noticed this the day following treatment.  Feels better over the lateral hip and in the adductor region.  He did have his first session of formalized physical therapy with evaluation and states she gave him some help for exercises and activities to perform.   R wrist/forearm - during our visit, he also mentioned having some difficulty with writing and certain pronation/supination motions about the wrist.  I did independently review his chart from 2023 where he suffered a mildly displaced radial head fracture and likely triquetral avulsion fracture.  He was splinted/casted for a period of time and had bony healing but never underwent therapy.  Pertinent ROS were reviewed with the patient and found to be negative unless otherwise specified above in HPI.   Assessment & Plan: Visit Diagnoses:  1. Chronic right hip pain   2. Tendinopathy of right gluteal region   3. Strain of adductor magnus muscle of right lower extremity, initial encounter   4. Closed displaced fracture of head of right radius, sequela   5. Wrist stiffness, right    Plan: Impression is chronic right lateral hip and adductor pain with evidence of GTPS with gluteal medius tendinopathy and abduction insufficiency.  He does have some proximal muscle weakness from his MS that is associated. He noticed a very good response to our first treatment of extracorporeal shockwave therapy, we did  repeat this again today.  He will continue his formal PT and HEP as well.   During our visit and treatment he did mention that he has been having a hard time writing with a pen/pencil and having restriction with dexterity based motions about the wrist, specifically pronation.  Chart review showed a previous radial head fracture with triquetral avulsion fracture in 2023 that was treated with prolonged splinting but did have callus healing.  Given the nature of the radial head with pronation supination, I believe he has residual stiffness from his previous fracture as he never underwent PT or OT following.  Extension and flexion about the wrist is full and symmetric but he does have pronation > supination restriction on the right side as well as reported issues with dexterity and fine based movement.  I do not think we need to pursue EMG/NCS yet, but would like to get him with our occupational therapist, [name], to take official measurements with range of motion and strength testing and build him a home exercise regimen to work on improving range of motion and function. [name] is certainly interested in this.  Meds & Orders: No orders of the defined types were placed in this encounter.  No orders of the defined types were placed in this encounter.    Procedures: Procedure: ECSWT Indications: Adductor strain, GTPS    Procedure Details Consent: Risks of procedure as well as the alternatives and risks of each were explained to the patient.  Verbal consent for procedure obtained. Time Out: Verified patient identification, verified procedure,  site was marked, verified correct patient position. The area was cleaned with alcohol swab.     The right adductor region was targeted for Extracorporeal shockwave therapy.    Preset: Muscular injury Power Level: 110 mJ Frequency: 12 Hz Impulse/cycles: 1800 Head size: Regular   The right greater trochanteric region/gluteal tendons was targeted for  Extracorporeal shockwave therapy.    Preset: GTPS Power Level: 110 mJ Frequency: 12 Hz Impulse/cycles: 2600 Head size: Regular   Patient tolerated procedure well without immediate complications.      Clinical History: No specialty comments available.  He reports that he quit smoking about 13 years ago. His smoking use included cigarettes. He has never used smokeless tobacco. No results for input(s): HGBA1C, LABURIC in the last 8760 hours.  Objective:    Physical Exam  Gen: Well-appearing, in no acute distress; non-toxic CV: Well-perfused. Warm.  Resp: Breathing unlabored on room air; no wheezing. Psych: Fluid speech in conversation; appropriate affect; normal thought process  Ortho Exam - Right hip/Adductor: Mild TTP over the greater trochanteric region and near the adductor insertion just off the pubic tubercle.  - Right wrist: No bony tenderness to palpation.  There is asymmetric restriction with pronation about the right wrist and forearm compared to full pronation of the contralateral left wrist.  Supination is near equivocal.  There is mildly diminished strength with Ok testing on the right.  There is full and equivocal range of motion with wrist flexion and extension.  There is pain within the forearm with resisted wrist extension.  Imaging:  *Independent review and interpretation of right elbow and right wrist x-rays from 12/18/2021 followed by serial follow-up x-rays of the right wrist and elbow demonstrate a previous mildly displaced radial head fracture, transverse in nature, there is also a dorsal avulsion fracture of the triquetrum.  Radial head fracture healed with only minimal displacement and good callus formation.          DG Wrist Complete Right Final result 12/18/2021 2:57 PM      CLINICAL DATA:  Right wrist pain.  Fall 5 days prior.  EXAM: RIGHT WRIST - COMPLETE 3+ VIEW  COMPARISON:  None Available.  FINDINGS: There is a small 2 mm ossicle seen just  dorsal to the midcarpal joint on lateral view suspicious for a triquetral avulsion fracture. ...   Study Result  Narrative & Impression  CLINICAL DATA:  Fall 5 days ago.  Pain with supination.   EXAM: RIGHT ELBOW - COMPLETE 3+ VIEW   COMPARISON:  None Available.   FINDINGS: There is an acute transverse fracture of the distal aspect of the radial neck with approximately 2 mm ulnar/medial and 2 mm posterior displacement of the distal fracture component with respect to the proximal fracture component. No dislocation.   Minimal peripheral medial elbow degenerative osteophytosis/spurring at the medial aspect of the coronoid process. There is elevation of the distal anterior humeral fat pad and an elbow joint effusion.   IMPRESSION: Acute transverse fracture of the distal aspect of the radial neck with mild 2 mm medial and 2 mm posterior displacement of the distal fracture component.     Electronically Signed   By: Tanda Lyons M.D.   On: 12/18/2021 15:02      Past Medical/Family/Surgical/Social History: Medications & Allergies reviewed per EMR, new medications updated. 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    Other postprocedural status(V45.89)    inguinal herniorrhaphies bilateral    Sleep apnea    cpap   Testosterone  deficiency 11/04/11   Family History  Problem Relation Age of Onset   Arthritis Mother    Colon cancer Neg Hx     Esophageal cancer Neg Hx    Rectal cancer Neg Hx    Stomach cancer Neg Hx    Allergic rhinitis Neg Hx    Angioedema Neg Hx    Asthma Neg Hx    Immunodeficiency Neg Hx    Urticaria Neg Hx    Eczema Neg Hx    Atopy Neg Hx    Past Surgical History:  Procedure Laterality Date   HERNIA REPAIR Bilateral 1971   LAPAROSCOPIC CHOLECYSTECTOMY  2014   Social History   Occupational History   Occupation: retired/disabilty  Tobacco Use   Smoking status: Former    Current packs/day: 0.00    Types: Cigarettes    Quit date: 07/14/2010    Years since quitting: 13.4   Smokeless tobacco: Never  Vaping Use   Vaping status: Never Used  Substance and Sexual Activity   Alcohol use: No   Drug use: No   Sexual activity: Not Currently

## 2023-12-25 NOTE — Progress Notes (Signed)
 Patient says that he has noticed improvement in his hip since the first shockwave therapy. He says that his soreness, particularly at night, is no longer noticeable. He states he is about 30% improved overall, and has had that level of improvement since the day after the first shockwave treatment. He would like to repeat that today.

## 2023-12-30 NOTE — Therapy (Signed)
 OUTPATIENT PHYSICAL THERAPY LOWER EXTREMITY TREATMENT   Patient Name: [name] [name] MRN: [id number] DOB:03-May-1965, 58 y.o., male Today's Date: 12/31/2023  END OF SESSION:  PT End of Session - 12/31/23 1111     Visit Number 2    Number of Visits 16    Date for Recertification  02/15/24    Authorization Type [employer] MEDICARE $20 COPAY    Authorization Time Period 12/21/2023-02/15/2024    Authorization - Visit Number 2    Authorization - Number of Visits 16    Progress Note Due on Visit 10    PT Start Time 1105    PT Stop Time 1146    PT Time Calculation (min) 41 min    Activity Tolerance Patient tolerated treatment well    Behavior During Therapy [facility] for tasks assessed/performed           Past Medical History:  Diagnosis Date   Heart murmur    Multiple sclerosis (HCC)    dx 2014   Neuromuscular disorder (HCC) 10/2012   high probability of MS   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    Other postprocedural status(V45.89)    inguinal herniorrhaphies bilateral    Sleep apnea    cpap   Testosterone  deficiency 11/04/11   Past Surgical History:  Procedure Laterality Date   HERNIA REPAIR Bilateral 1971   LAPAROSCOPIC CHOLECYSTECTOMY  2014   Patient Active Problem List   Diagnosis Date Noted   Right radial head fracture 12/19/2021   Right sided abdominal pain 02/04/2019   Former smoker 01/18/2019   Food allergy  10/24/2016   Other allergic rhinitis 10/24/2016   History of adenomatous polyp of colon 10/23/2014   Multiple sclerosis (HCC) 11/03/2012   Hypogonadism male 10/06/2011    PCP: Norleen JAYSON Jobs, MD   REFERRING PROVIDER: Lonell Sprang, DO  REFERRING DIAG: [phone] (ICD-10-CM) - Chronic right hip pain R29.898 (ICD-10-CM) - Weakness of right hip M67.951 (ICD-10-CM) - Tendinopathy of right gluteal region  THERAPY DIAG:  Pain in right hip  Stiffness of right hip, not elsewhere classified  Muscle weakness  (generalized)  Other abnormalities of gait and mobility  Unsteadiness on feet  Rationale for Evaluation and Treatment: Rehabilitation  ONSET DATE: 2 years it was worse then finally went to Dr. Sprang 5 months ago  SUBJECTIVE:   SUBJECTIVE STATEMENT: Patient noting improved symptoms since eval. Pain/soreness rated at 3/10 in the morning, 5/10 in the evening.  PERTINENT HISTORY: Patient experienced a fall 5 years ago where he had a fall on ice and landed on lateral Rt LE. The fall led to extensive bruising on entire lateral thigh as well as pain with no change over several months. Patient and PCP thought it was an effect from MS (diagnoses in 2014). Patient has received injection for bursitis from PCP within first year with no change in symptoms and is currently undergoing extracorporeal shockwave therapy through Dr. Sprang which is largely improving symptoms. Patient has had no other past surgeries or injuries to Rt LE/hip or surrounding areas.  PAIN:  Are you having pain? Yes: NPRS scale: 1/10 at rest, 12/10 with hard STM  Pain location: Rt lateral hip, adductors/groin Pain description: dull Aggravating factors: deep tissue work  Relieving factors: shock therapy, mobility work   PRECAUTIONS: None  RED FLAGS: None   WEIGHT BEARING RESTRICTIONS: No  FALLS:  Has patient fallen in last 6 months? No  LIVING ENVIRONMENT: Lives with: dog Lives in: House/apartment Stairs: Yes:  External: 2 steps; on right going up Has following equipment at home: Single point cane  OCCUPATION: disability, financial trading  PLOF: Independent and uses single point cane   PATIENT GOALS: to be normal again  NEXT MD VISIT: this Friday and following Friday for shock therapy   OBJECTIVE:  Note: Objective measures were completed at Evaluation unless otherwise noted.  DIAGNOSTIC FINDINGS:  IMPRESSION: Osteopenia. No acute osseous abnormalities.  IMPRESSION: 1. Mild osteoarthritis of the  right hip with anterosuperior labral tear. 2. Mild tendinosis of the bilateral gluteus medius tendons. 3. Mild intramuscular edema within the right adductor magnus and brevis muscles at their origin on the right ischiopubic ramus, suggestive of a low-grade muscle strain.  PATIENT SURVEYS:  PSFS: THE PATIENT SPECIFIC FUNCTIONAL SCALE  Place score of 0-10 (0 = unable to perform activity and 10 = able to perform activity at the same level as before injury or problem)  Activity Date: 12/21/2023    Walking  7    2. Stairs  5    3.     4.      Total Score 6      Total Score = Sum of activity scores/number of activities  Minimally Detectable Change: 3 points (for single activity); 2 points (for average score)  [facility] (nd). The Patient Specific Functional Scale . Retrieved from [email or web address]   COGNITION: Overall cognitive status: Within functional limits for tasks assessed     SENSATION: Light touch: WFL  EDEMA:  Not assessed on eval  MUSCLE LENGTH: Not formally assessed, though hamstring tightness noted with SLR  POSTURE: rounded shoulders, forward head, and increased thoracic kyphosis  PALPATION: Not TTP in Rt glutes or lateral thigh   LOWER EXTREMITY ROM:  ROM Right Eval 12/21/2023 Left Eval 12/21/2023  Hip flexion 2+/5 3+/5  Hip extension 2+/5 3-/5  Hip abduction 3-/5 3-/5 with compensation into hip flexion  Hip adduction    Hip internal rotation    Hip external rotation    Knee flexion 4-/5 4/5  Knee extension 5/5 5/5  Ankle dorsiflexion 3+/5 4-/5  Ankle plantarflexion    Ankle inversion    Ankle eversion     (Blank rows = not tested)  LOWER EXTREMITY MMT:  MMT Right Eval 12/21/2023 Left Eval 12/21/2023  Hip flexion [facility] WFL  Hip extension    Hip abduction    Hip adduction    Hip internal rotation    Hip external rotation    Knee flexion    Knee extension    Ankle dorsiflexion     Ankle plantarflexion    Ankle inversion    Ankle eversion     (Blank rows = not tested)  LOWER EXTREMITY SPECIAL TESTS:  Hip special tests: Belvie (FABER) test: negative and FADIR negative though patient notes muscle tightness in lateral glutes with FABER  FUNCTIONAL TESTS:  5 times sit to stand: 16.34s using bilat UEs and uncontrolled   GAIT: Distance walked: not formally assessed  Assistive device utilized: Single point cane Level of assistance: supervision  Comments: wide BOS, decreased knee flexion throughout bilaterally; will require further in depth assessment  TREATMENT DATE:  12/31/2023 TherEx: Nustep level 4 for 8 minutes, 4 with bilat UE/LE and 4 with bilat LE only  Glute bridges 1x3 with PT placement of feet on table, patient performing with adduction and increased speed throughout reps; verbal cues given for both with patient having moderate carryover for speed of movement, but not adduction  PT placed yellow TB around knees for tactile cue to decrease adduction moment which improved performance, 2x8 ; intermittent reminders given for speed of movement  Yellow TB given to patient for HEP  Discussed adding calf raises and toe raises to HEP, patient performing 1x10 of each with difficulty on Rt LE secondary to MS diagnosis   Neuro Re-Ed:  General static balance assessment performed in parallel bars with CGA  Normal stance for 20s with no sway noted  Narrow stance for 20s with no sway noted  Tandem stance with mod-large increase in sway for 20s each  Unable to perform SLS safely   Narrow stance with eyes closed with CGA 1x20s with moderate increase in sway   Semi-tandem stance 2x20s each side with CGA  PT discussed 3 balance systems, how they interact, and how they may be impacted by MS diagnosis   12/21/2023 TherEx:  HEP handout provided  with patient performing one set of each exercise for appropriate form. Verbal and tactile cues required.   Self-Care:  PT discussed POC, osteopenia and importance of weightbearing and strengthening activities, muscle attachments, imagine results    PATIENT EDUCATION:  Education details: HEP, POC, osteopenia Person educated: Patient Education method: Explanation, Demonstration, Tactile cues, Verbal cues, and Handouts Education comprehension: verbalized understanding, returned demonstration, verbal cues required, tactile cues required, and needs further education  HOME EXERCISE PROGRAM: Access Code: [email or web address] URL: [email or web address] Date: 12/21/2023 Prepared by: Susannah Daring  Exercises - Supine Bridge  - 1 x daily - 7 x weekly - 2 sets - 10 reps - 3s hold - Standing Hip Abduction with Counter Support  - 1 x daily - 7 x weekly - 2 sets - 10 reps - Standing Hip Extension with Counter Support  - 1 x daily - 7 x weekly - 2 sets - 10 reps - Mini Squat with Counter Support  - 1 x daily - 7 x weekly - 2 sets - 10 reps - Seated Hamstring Stretch  - 1 x daily - 7 x weekly - 2 sets - 30s hold  ASSESSMENT:  CLINICAL IMPRESSION: Patient arrived to session noting improved symptoms since eval secondary to performing HEP as well as continuing shock wave therapy with Dr. Burnetta. Patient required intermittent verbal cues for speed of movements and appropriate form for activities throughout session with moderate carryover. Patient also requiring CGA for balance assessment especially with tandem stance. Patient will continue to benefit from skilled PT.   OBJECTIVE IMPAIRMENTS: Abnormal gait, decreased activity tolerance, decreased balance, decreased coordination, decreased endurance, decreased mobility, difficulty walking, decreased ROM, decreased strength, impaired flexibility, improper body mechanics, postural dysfunction, and pain.   ACTIVITY LIMITATIONS: lifting, standing, squatting,  stairs, and transfers  PARTICIPATION LIMITATIONS: community activity and occupation  PERSONAL FACTORS: Fitness, Past/current experiences, Time since onset of injury/illness/exacerbation, and 3+ comorbidities: multiple sclerosis, heart murmur, sleep apnea are also affecting patient's functional outcome.   REHAB POTENTIAL: Good  CLINICAL DECISION MAKING: Evolving/moderate complexity  EVALUATION COMPLEXITY: Moderate   GOALS: Goals reviewed with patient? Yes  SHORT TERM GOALS: Target date: 01/11/2024 Patient will show compliance with initial HEP.  Baseline: Goal status: INITIAL  2.  Patient will report pain levels no greater than 4/10 in order to show improved overall quality of life. Baseline:  Goal status: INITIAL   LONG TERM GOALS: Target date: 02/15/2024  Patient will be independent with final HEP in order to maintain and progress upon functional gains made within PT.  Baseline:  Goal status: INITIAL  2.  Patient will report pain levels no greater than 2/10 in order to show improved overall quality of life. Baseline:  Goal status: INITIAL  3.  Patient will increase PSFS to at least 8 in order to show a significant improvement in subjective disability rating. Baseline:  Goal status: INITIAL  4.  Patient will improve bilat hip abduction MMT to at least 4/5 in order to improve biomechanics with functional mobility. Baseline:  Goal status: INITIAL  5.  Patient will improve bilat hip extension MMT to at least 4/5 in order to improve biomechanics with functional mobility. Baseline:  Goal status: INITIAL  6.  Patient will improve 5xSTS to at least 12s in order to decrease risk of falling.  Baseline:  Goal status: INITIAL   PLAN:  PT FREQUENCY: 1-2x/week  PT DURATION: 8 weeks  PLANNED INTERVENTIONS: 97164- PT Re-evaluation, 97750- Physical Performance Testing, 97110-Therapeutic exercises, 97530- Therapeutic activity, [id number]- Neuromuscular re-education, 97535- Self  Care, 02859- Manual therapy, [id number]- Gait training, [phone]- Canalith repositioning, H9716- Electrical stimulation (unattended), [phone]- Electrical stimulation (manual), [id number]- Vasopneumatic device, [id number]- Ultrasound, [id number]- Traction (mechanical), [id number]- Ionotophoresis 4mg /ml Dexamethasone, 79439 (1-2 muscles), 20561 (3+ muscles)- Dry Needling, Patient/Family education, Balance training, Stair training, Taping, Joint mobilization, Joint manipulation, Spinal manipulation, Spinal mobilization, Scar mobilization, Vestibular training, DME instructions, Cryotherapy, and Moist heat  PLAN FOR NEXT SESSION: FGA, review changes made to HEP, generalized LE strengthening, assessment of gait with and without SPC, gait training with cane    Susannah Daring, PT, DPT 12/31/23 4:14 PM   Date of referral: 11/30/2023 Referring provider: Lonell Sprang, DO Referring diagnosis? M25.551,G89.29 (ICD-10-CM) - Chronic right hip pain R29.898 (ICD-10-CM) - Weakness of right hip M67.951 (ICD-10-CM) - Tendinopathy of right gluteal region Treatment diagnosis? (if different than referring diagnosis) M25.551, M25.651, M62.81, R26.89, R26.81  What was this (referring dx) caused by? Fall and Ongoing Issue  Lysle of Condition: Chronic (continuous duration > 3 months)   Laterality: Rt  Current Functional Measure Score: Patient Specific Functional Scale 6  Objective measurements identify impairments when they are compared to normal values, the uninvolved extremity, and prior level of function.  [x]  Yes  []  No  Objective assessment of functional ability: Moderate functional limitations   Briefly describe symptoms: pain with palpation, weakness, unsteady on feet   How did symptoms start: patient slipped on ice several years ago and symptoms never dissipated  Average pain intensity:  Last 24 hours: 2/10  Past week: 5/10  How often does the pt experience symptoms? Frequently  How much have the symptoms interfered with usual  daily activities? Moderately  How has condition changed since care began at this facility? NA - initial visit  In general, how is the patients overall health? Fair   BACK PAIN (STarT Back Screening Tool) No

## 2023-12-31 ENCOUNTER — Ambulatory Visit (INDEPENDENT_AMBULATORY_CARE_PROVIDER_SITE_OTHER)

## 2023-12-31 DIAGNOSIS — M25551 Pain in right hip: Secondary | ICD-10-CM

## 2023-12-31 DIAGNOSIS — M6281 Muscle weakness (generalized): Secondary | ICD-10-CM | POA: Diagnosis not present

## 2023-12-31 DIAGNOSIS — M25651 Stiffness of right hip, not elsewhere classified: Secondary | ICD-10-CM | POA: Diagnosis not present

## 2023-12-31 DIAGNOSIS — R2681 Unsteadiness on feet: Secondary | ICD-10-CM | POA: Diagnosis not present

## 2023-12-31 DIAGNOSIS — R2689 Other abnormalities of gait and mobility: Secondary | ICD-10-CM

## 2024-01-01 ENCOUNTER — Encounter: Payer: Self-pay | Admitting: Sports Medicine

## 2024-01-01 ENCOUNTER — Ambulatory Visit: Admitting: Sports Medicine

## 2024-01-01 DIAGNOSIS — G8929 Other chronic pain: Secondary | ICD-10-CM | POA: Diagnosis not present

## 2024-01-01 DIAGNOSIS — M67951 Unspecified disorder of synovium and tendon, right thigh: Secondary | ICD-10-CM

## 2024-01-01 DIAGNOSIS — M25551 Pain in right hip: Secondary | ICD-10-CM

## 2024-01-01 DIAGNOSIS — S76211D Strain of adductor muscle, fascia and tendon of right thigh, subsequent encounter: Secondary | ICD-10-CM | POA: Diagnosis not present

## 2024-01-01 NOTE — Progress Notes (Signed)
 Patient says that he does feel he is continuing to improve. He says that none of his pain or discomfort is completely gone, but is better than it was. Physical therapy is still going well, and seems to be helping. He has occupational therapy scheduled for his wrist next week.

## 2024-01-01 NOTE — Progress Notes (Signed)
 [name] [name] - 58 y.o. male MRN [id number]  Date of birth: 1965-06-24  Office Visit Note: Visit Date: 01/01/2024 PCP: Joyce Norleen BROCKS, MD Referred by: Joyce Norleen BROCKS, MD  Subjective: Chief Complaint  Patient presents with   Right Hip - Follow-up   Right Wrist - Follow-up   HPI: [name] [name] is a pleasant 58 y.o. male who presents today for following up for the right hip and adductor.  [name] [name] is quite pleased with the improvements he has made, he responded well to extracorporeal shockwave therapy both for the greater trochanteric/lateral hip as well as his adductor region previously.  He is continuing through formalized physical therapy and transitioning to HEP.  Having more adductor pain near the distal insertion just proximal to the knee today.  Wrist/forearm -he did make an appointment upcoming with [name] for OT although has not started this yet.  Pertinent ROS were reviewed with the patient and found to be negative unless otherwise specified above in HPI.   Assessment & Plan: Visit Diagnoses:  1. Tendinopathy of right gluteal region   2. Chronic right hip pain   3. 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  This is in the setting of some proximal muscle weakness from MS.  He has made improvements in pain and function with extracorporeal shockwave therapy and formalized physical therapy.  We did repeat an additional treatment of ESWT today for the GT/lateral hip as well as the adductor musculature.  I would like him to continue through PT with transition to HEP.  He also has an upcoming appointment with [name] to evaluate the wrist and work on improving range of motion and dexterity.  Given his improvements with above, I would like to see how he does over the next month, he  he may shoot me a MyChart message to discuss his progress/update at that time.  Happy to call or return sooner if any issues arise.  Follow-up: Return for Can mychart message update in 1 month.   Meds & Orders: No orders of the defined types were placed in this encounter.  No orders of the defined types were placed in this encounter.    Procedures: Procedure: ECSWT Indications: Adductor strain, GTPS    Procedure Details Consent: Risks of procedure as well as the alternatives and risks of each were explained to the patient.  Verbal consent for procedure obtained. Time Out: Verified patient identification, verified procedure, site was marked, verified correct patient position. The area was cleaned with alcohol swab.     The right adductor longus region was targeted for Extracorporeal shockwave therapy.    Preset: Muscular injury Power Level: 110 mJ Frequency: 12 Hz Impulse/cycles: 2000 Head size: Regular   The right greater trochanteric region/gluteal tendons was targeted for Extracorporeal shockwave therapy.    Preset: GTPS Power Level: 110 mJ Frequency: 12 Hz Impulse/cycles: 2400 Head size: Regular   Patient tolerated procedure well without immediate complications.      Clinical History: No specialty comments available.  He reports that he quit smoking about 13 years ago. His smoking use included cigarettes. He has never used smokeless tobacco. No results for input(s): HGBA1C, LABURIC in the last 8760 hours.  Objective:    Physical Exam  Gen: Well-appearing, in no acute distress; non-toxic CV: Well-perfused. Warm.  Resp: Breathing  unlabored on room air; no wheezing. Psych: Fluid speech in conversation; appropriate affect; normal thought process  Ortho Exam - Right hip/leg: Minimal tenderness over the greater trochanteric region today.  Mild TTP in the gluteal insertion but improved from previous visits.  There is more tenderness over the distal adductor belly in  insertion but no redness or swelling.  Imaging: No results found.  Past Medical/Family/Surgical/Social History: Medications & Allergies reviewed per EMR, new medications updated. 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    Other postprocedural status(V45.89)    inguinal herniorrhaphies bilateral    Sleep apnea    cpap   Testosterone  deficiency 11/04/11   Family History  Problem Relation Age of Onset   Arthritis Mother    Colon cancer Neg Hx    Esophageal cancer Neg Hx    Rectal cancer Neg Hx    Stomach cancer Neg Hx    Allergic rhinitis Neg Hx    Angioedema Neg Hx    Asthma Neg Hx    Immunodeficiency Neg Hx    Urticaria Neg Hx    Eczema Neg Hx    Atopy Neg Hx    Past Surgical History:  Procedure Laterality Date   HERNIA REPAIR Bilateral 1971   LAPAROSCOPIC CHOLECYSTECTOMY  2014   Social History   Occupational History   Occupation: retired/disabilty  Tobacco Use   Smoking status: Former    Current packs/day: 0.00    Types: Cigarettes    Quit date: 07/14/2010    Years since quitting: 13.4   Smokeless tobacco: Never  Vaping Use   Vaping status: Never Used  Substance and Sexual Activity   Alcohol use: No   Drug use: No   Sexual activity: Not Currently

## 2024-01-04 NOTE — Therapy (Incomplete)
 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    Other postprocedural status(V45.89)    inguinal herniorrhaphies bilateral    Sleep apnea    cpap   Testosterone  deficiency 11/04/11   Past Surgical History:  Procedure Laterality Date   HERNIA REPAIR Bilateral 1971   LAPAROSCOPIC CHOLECYSTECTOMY  2014   Patient Active Problem List   Diagnosis Date Noted   Right radial head fracture 12/19/2021   Right sided abdominal pain 02/04/2019   Former smoker 01/18/2019   Food allergy  10/24/2016   Other allergic rhinitis 10/24/2016   History of adenomatous polyp of colon 10/23/2014   Multiple sclerosis 11/03/2012   Hypogonadism male 10/06/2011    PCP: Norleen JAYSON Jobs, MD   REFERRING PROVIDER: Lonell Sprang, DO  REFERRING DIAG: [phone] (ICD-10-CM) - Chronic right hip pain R29.898 (ICD-10-CM) - Weakness of right hip M67.951 (ICD-10-CM) - Tendinopathy of right gluteal region  THERAPY DIAG:  No diagnosis found.  Rationale for Evaluation and Treatment: Rehabilitation  ONSET DATE: 2 years it was worse then finally went to Dr. Sprang 5 months ago  SUBJECTIVE:   SUBJECTIVE STATEMENT: *** Patient noting improved symptoms since eval. Pain/soreness rated at 3/10 in the morning, 5/10 in the evening.  PERTINENT HISTORY: Patient experienced a fall 5 years ago where he had a fall on ice and landed on lateral Rt LE. The fall led to extensive bruising on entire lateral thigh as well as pain with no change over several months. Patient and PCP thought it was an effect from MS (diagnoses in 2014). Patient has received injection for  bursitis from PCP within first year with no change in symptoms and is currently undergoing extracorporeal shockwave therapy through Dr. Sprang which is largely improving symptoms. Patient has had no other past surgeries or injuries to Rt LE/hip or surrounding areas.  PAIN:  Are you having pain? Yes: NPRS scale: 1/10 at rest, 12/10 with hard STM  Pain location: Rt lateral hip, adductors/groin Pain description: dull Aggravating factors: deep tissue work  Relieving factors: shock therapy, mobility work   PRECAUTIONS: None  RED FLAGS: None   WEIGHT BEARING RESTRICTIONS: No  FALLS:  Has patient fallen in last 6 months? No  LIVING ENVIRONMENT: Lives with: dog Lives in: House/apartment Stairs: Yes: External: 2 steps; on right going up Has following equipment at home: Single point cane  OCCUPATION: disability, financial trading  PLOF: Independent and uses single point cane   PATIENT GOALS: to be normal again  NEXT MD VISIT: this Friday and following Friday for shock therapy   OBJECTIVE:  Note: Objective measures were completed at Evaluation unless otherwise noted.  DIAGNOSTIC FINDINGS:  IMPRESSION: Osteopenia. No acute osseous abnormalities.  IMPRESSION: 1. Mild osteoarthritis of the right hip with anterosuperior labral tear. 2. Mild tendinosis of the bilateral gluteus medius tendons. 3. Mild intramuscular edema within the right adductor magnus and brevis muscles at their origin on the right ischiopubic ramus, suggestive of a low-grade muscle strain.  PATIENT SURVEYS:  PSFS: THE PATIENT SPECIFIC FUNCTIONAL SCALE  Place score of 0-10 (0 = unable to perform activity and 10 = able  to perform activity at the same level as before injury or problem)  Activity Date: 12/21/2023    Walking  7    2. Stairs  5    3.     4.      Total Score 6      Total Score = Sum of activity scores/number of activities  Minimally Detectable Change: 3 points (for single activity); 2 points  (for average score)  [facility] (nd). The Patient Specific Functional Scale . Retrieved from [email or web address]   COGNITION: Overall cognitive status: Within functional limits for tasks assessed     SENSATION: Light touch: WFL  EDEMA:  Not assessed on eval  MUSCLE LENGTH: Not formally assessed, though hamstring tightness noted with SLR  POSTURE: rounded shoulders, forward head, and increased thoracic kyphosis  PALPATION: Not TTP in Rt glutes or lateral thigh   LOWER EXTREMITY ROM:  ROM Right Eval 12/21/2023 Left Eval 12/21/2023  Hip flexion 2+/5 3+/5  Hip extension 2+/5 3-/5  Hip abduction 3-/5 3-/5 with compensation into hip flexion  Hip adduction    Hip internal rotation    Hip external rotation    Knee flexion 4-/5 4/5  Knee extension 5/5 5/5  Ankle dorsiflexion 3+/5 4-/5  Ankle plantarflexion    Ankle inversion    Ankle eversion     (Blank rows = not tested)  LOWER EXTREMITY MMT:  MMT Right Eval 12/21/2023 Left Eval 12/21/2023  Hip flexion [facility] WFL  Hip extension    Hip abduction    Hip adduction    Hip internal rotation    Hip external rotation    Knee flexion    Knee extension    Ankle dorsiflexion    Ankle plantarflexion    Ankle inversion    Ankle eversion     (Blank rows = not tested)  LOWER EXTREMITY SPECIAL TESTS:  Hip special tests: Belvie (FABER) test: negative and FADIR negative though patient notes muscle tightness in lateral glutes with FABER  FUNCTIONAL TESTS:  5 times sit to stand: 16.34s using bilat UEs and uncontrolled   GAIT: Distance walked: not formally assessed  Assistive device utilized: Single point cane Level of assistance: supervision  Comments: wide BOS, decreased knee flexion throughout bilaterally; will require further in depth assessment                                                                                                                                  TREATMENT DATE:  01/05/2024 ***FGA   12/31/2023 TherEx: Nustep level 4 for 8 minutes, 4 with bilat UE/LE and 4 with bilat LE only  Glute bridges 1x3 with PT placement of feet on table, patient performing with adduction and increased speed throughout reps; verbal cues given for both with patient having moderate carryover for speed of movement, but not adduction  PT placed yellow TB around knees for tactile cue to decrease adduction moment which improved performance,  2x8 ; intermittent reminders given for speed of movement  Yellow TB given to patient for HEP  Discussed adding calf raises and toe raises to HEP, patient performing 1x10 of each with difficulty on Rt LE secondary to MS diagnosis   Neuro Re-Ed:  General static balance assessment performed in parallel bars with CGA  Normal stance for 20s with no sway noted  Narrow stance for 20s with no sway noted  Tandem stance with mod-large increase in sway for 20s each  Unable to perform SLS safely   Narrow stance with eyes closed with CGA 1x20s with moderate increase in sway   Semi-tandem stance 2x20s each side with CGA  PT discussed 3 balance systems, how they interact, and how they may be impacted by MS diagnosis   12/21/2023 TherEx:  HEP handout provided with patient performing one set of each exercise for appropriate form. Verbal and tactile cues required.   Self-Care:  PT discussed POC, osteopenia and importance of weightbearing and strengthening activities, muscle attachments, imagine results    PATIENT EDUCATION:  Education details: HEP, POC, osteopenia Person educated: Patient Education method: Explanation, Demonstration, Tactile cues, Verbal cues, and Handouts Education comprehension: verbalized understanding, returned demonstration, verbal cues required, tactile cues required, and needs further education  HOME EXERCISE PROGRAM: Access Code: [email or web address] URL: [email or web address] Date:  12/21/2023 Prepared by: Susannah Daring  Exercises - Supine Bridge  - 1 x daily - 7 x weekly - 2 sets - 10 reps - 3s hold - Standing Hip Abduction with Counter Support  - 1 x daily - 7 x weekly - 2 sets - 10 reps - Standing Hip Extension with Counter Support  - 1 x daily - 7 x weekly - 2 sets - 10 reps - Mini Squat with Counter Support  - 1 x daily - 7 x weekly - 2 sets - 10 reps - Seated Hamstring Stretch  - 1 x daily - 7 x weekly - 2 sets - 30s hold  ASSESSMENT:  CLINICAL IMPRESSION: *** Patient arrived to session noting improved symptoms since eval secondary to performing HEP as well as continuing shock wave therapy with Dr. Burnetta. Patient required intermittent verbal cues for speed of movements and appropriate form for activities throughout session with moderate carryover. Patient also requiring CGA for balance assessment especially with tandem stance. Patient will continue to benefit from skilled PT.   OBJECTIVE IMPAIRMENTS: Abnormal gait, decreased activity tolerance, decreased balance, decreased coordination, decreased endurance, decreased mobility, difficulty walking, decreased ROM, decreased strength, impaired flexibility, improper body mechanics, postural dysfunction, and pain.   ACTIVITY LIMITATIONS: lifting, standing, squatting, stairs, and transfers  PARTICIPATION LIMITATIONS: community activity and occupation  PERSONAL FACTORS: Fitness, Past/current experiences, Time since onset of injury/illness/exacerbation, and 3+ comorbidities: multiple sclerosis, heart murmur, sleep apnea are also affecting patient's functional outcome.   REHAB POTENTIAL: Good  CLINICAL DECISION MAKING: Evolving/moderate complexity  EVALUATION COMPLEXITY: Moderate   GOALS: Goals reviewed with patient? Yes  SHORT TERM GOALS: Target date: 01/11/2024 Patient will show compliance with initial HEP.  Baseline: Goal status: INITIAL  2.  Patient will report pain levels no greater than 4/10 in order to  show improved overall quality of life. Baseline:  Goal status: INITIAL   LONG TERM GOALS: Target date: 02/15/2024  Patient will be independent with final HEP in order to maintain and progress upon functional gains made within PT.  Baseline:  Goal status: INITIAL  2.  Patient will report pain levels no greater than 2/10 in order to show  improved overall quality of life. Baseline:  Goal status: INITIAL  3.  Patient will increase PSFS to at least 8 in order to show a significant improvement in subjective disability rating. Baseline:  Goal status: INITIAL  4.  Patient will improve bilat hip abduction MMT to at least 4/5 in order to improve biomechanics with functional mobility. Baseline:  Goal status: INITIAL  5.  Patient will improve bilat hip extension MMT to at least 4/5 in order to improve biomechanics with functional mobility. Baseline:  Goal status: INITIAL  6.  Patient will improve 5xSTS to at least 12s in order to decrease risk of falling.  Baseline:  Goal status: INITIAL   PLAN:  PT FREQUENCY: 1-2x/week  PT DURATION: 8 weeks  PLANNED INTERVENTIONS: 97164- PT Re-evaluation, 97750- Physical Performance Testing, 97110-Therapeutic exercises, 97530- Therapeutic activity, [id number]- Neuromuscular re-education, 97535- Self Care, 02859- Manual therapy, [id number]- Gait training, [phone]- Canalith repositioning, H9716- Electrical stimulation (unattended), [id number]- Electrical stimulation (manual), [id number]- Vasopneumatic device, [id number]- Ultrasound, [id number]- Traction (mechanical), [id number]- Ionotophoresis 4mg /ml Dexamethasone, 79439 (1-2 muscles), 20561 (3+ muscles)- Dry Needling, Patient/Family education, Balance training, Stair training, Taping, Joint mobilization, Joint manipulation, Spinal manipulation, Spinal mobilization, Scar mobilization, Vestibular training, DME instructions, Cryotherapy, and Moist heat  PLAN FOR NEXT SESSION: *** FGA, review changes made to HEP, generalized LE strengthening,  assessment of gait with and without SPC, gait training with cane    Susannah Daring, PT, DPT 01/04/24 3:32 PM   Date of referral: 11/30/2023 Referring provider: Lonell Sprang, DO Referring diagnosis? M25.551,G89.29 (ICD-10-CM) - Chronic right hip pain R29.898 (ICD-10-CM) - Weakness of right hip M67.951 (ICD-10-CM) - Tendinopathy of right gluteal region Treatment diagnosis? (if different than referring diagnosis) M25.551, M25.651, M62.81, R26.89, R26.81  What was this (referring dx) caused by? Fall and Ongoing Issue  Lysle of Condition: Chronic (continuous duration > 3 months)   Laterality: Rt  Current Functional Measure Score: Patient Specific Functional Scale 6  Objective measurements identify impairments when they are compared to normal values, the uninvolved extremity, and prior level of function.  [x]  Yes  []  No  Objective assessment of functional ability: Moderate functional limitations   Briefly describe symptoms: pain with palpation, weakness, unsteady on feet   How did symptoms start: patient slipped on ice several years ago and symptoms never dissipated  Average pain intensity:  Last 24 hours: 2/10  Past week: 5/10  How often does the pt experience symptoms? Frequently  How much have the symptoms interfered with usual daily activities? Moderately  How has condition changed since care began at this facility? NA - initial visit  In general, how is the patients overall health? Fair   BACK PAIN (STarT Back Screening Tool) No

## 2024-01-04 NOTE — Therapy (Incomplete)
 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    Other postprocedural status(V45.89)    inguinal herniorrhaphies bilateral    Sleep apnea    cpap   Testosterone  deficiency 11/04/11   Past Surgical History:  Procedure Laterality Date   HERNIA REPAIR Bilateral 1971   LAPAROSCOPIC CHOLECYSTECTOMY  2014   Patient Active Problem List   Diagnosis Date Noted   Right radial head fracture 12/19/2021   Right sided abdominal pain 02/04/2019   Former smoker 01/18/2019   Food allergy  10/24/2016   Other allergic rhinitis 10/24/2016   History of adenomatous polyp of colon 10/23/2014   Multiple sclerosis (HCC) 11/03/2012   Hypogonadism male 10/06/2011    ONSET DATE: *** chronic  REFERRING DIAG:  S52.121S (ICD-10-CM) - Closed displaced fracture of head of right radius, sequela  M25.631 (ICD-10-CM) - Wrist stiffness, right    THERAPY DIAG:  No diagnosis found.  Rationale for Evaluation and Treatment: Rehabilitation  SUBJECTIVE:   SUBJECTIVE STATEMENT: He states ***.    PERTINENT HISTORY: Chronic right wrist/forearm discomfort with pronation restriction s/p old radial head and triquetral fracture   PRECAUTIONS: {Therapy precautions:24002}  RED FLAGS: {PT Red Flags:29287}   WEIGHT BEARING RESTRICTIONS: {Yes ***/No:24003}  PAIN:  Are you having pain? Yes: NPRS scale: *** Pain location: *** Pain description: *** Aggravating factors: *** Relieving factors: ***  FALLS: Has patient fallen in last 6 months? {fallsyesno:27318}  LIVING ENVIRONMENT: Lives with:  {[facility] lives with:25569::lives with their family} Lives in: {Lives in:25570} Stairs: {opstairs:27293} Has following equipment at home: {Assistive devices:23999}  PLOF: {PLOF:24004}  PATIENT GOALS: ***  NEXT MD VISIT: ***   OBJECTIVE: (All objective assessments below are from initial evaluation on: 01/05/24 unless otherwise specified.)   HAND DOMINANCE: Right ***  ADLs: Overall ADLs: States decreased ability to grab, hold household objects, pain and difficulty to open containers, perform FMS tasks (manipulate fasteners on clothing), mild to moderate bathing problems as well. ***   FUNCTIONAL OUTCOME MEASURES: Eval: Patient Specific Functional Scale: *** (***, ***, ***)  (Higher Score  =  Better Ability for the Selected Tasks)      UPPER EXTREMITY ROM     Shoulder to Wrist AROM Right eval Left eval  Forearm supination    Forearm pronation     Wrist flexion    Wrist extension    Wrist ulnar deviation    Wrist radial deviation    Functional dart thrower's motion (F-DTM) in ulnar flexion    F-DTM in radial extension     (Blank rows = not tested)   Hand AROM Right eval  Full Fist Ability (or Gap to Distal Palmar Crease)   Thumb Opposition  (Kapandji Scale)    Thumb MCP (0-60)   Thumb IP (0-80)   Thumb Radial Abduction Span   Thumb Palmar Abduction Span   Index MCP (0-90)   Index PIP (0-100)   Index DIP (0-70)    Long MCP (0-90)    Long PIP (0-100)    Long DIP (0-70)    Ring MCP (0-90)  Ring PIP (0-100)    Ring DIP (0-70)    Little MCP (0-90)    Little PIP (0-100)    Little DIP (0-70)    (Blank rows = not tested)   UPPER EXTREMITY MMT:     MMT Right 01/05/24  Shoulder flexion   Shoulder abduction   Shoulder adduction   Shoulder extension   Shoulder internal rotation   Shoulder external rotation   Middle trapezius   Lower trapezius   Elbow flexion   Elbow extension   Forearm supination   Forearm pronation   Wrist flexion   Wrist extension    Wrist ulnar deviation   Wrist radial deviation   (Blank rows = not tested)  HAND FUNCTION: Eval: Observed weakness in affected *** hand.  Grip strength Right: *** lbs, Left: *** lbs   COORDINATION: Eval: Observed coordination impairments with affected *** hand. Box and Blocks Test: *** Blocks today (*** is [facility]); 9 Hole Peg Test Right: ***sec, Left: *** sec (*** sec is WFL)   SENSATION: Eval:  Light touch intact today,   *** though diminished around sx area    EDEMA:   Eval: *** Mildly swollen in *** hand and wrist today, ***cm circumferentially around ***  COGNITION: Eval: Overall cognitive status: WFL for evaluation today ***  OBSERVATIONS:   Eval: ***   TODAY'S TREATMENT:  Post-evaluation treatment: ***     PATIENT EDUCATION: Education details: See tx section above for details  Person educated: Patient Education method: Engineer, structural, Teach back, Handouts  Education comprehension: States and demonstrates understanding, Additional Education required    HOME EXERCISE PROGRAM: See tx section above for details    GOALS: Goals reviewed with patient? Yes   SHORT TERM GOALS: (STG required if POC>30 days) Target Date: ***  Pt will obtain protective, custom orthotic. Goal status: TBD/PRN,  MET ***  2.  Pt will demo/state understanding of initial HEP to improve pain levels and prerequisite motion. Goal status: INITIAL   LONG TERM GOALS: Target Date: ***  Pt will improve functional ability by decreased impairment per PSFS assessment from *** to *** or better, for better quality of life. Goal status: INITIAL  2.  Pt will improve grip strength in *** hand from ***lbs to at least ***lbs for functional use at home and in IADLs. Goal status: INITIAL  3.  Pt will improve A/ROM in *** from *** to at least ***, to have functional motion for tasks like reach and grasp.  Goal status: INITIAL  4.  Pt will improve strength in *** from *** MMT to at least *** MMT  to have increased functional ability to carry out selfcare and higher-level homecare tasks with less difficulty. Goal status: INITIAL  5.  Pt will improve coordination skills in ***, as seen by within functional limit score on *** testing to have increased functional ability to carry out fine motor tasks (fasteners, etc.) and more complex, coordinated IADLs (meal prep, sports, etc.).  Goal status: INITIAL  6.  Pt will decrease pain at worst from ***/10 to ***/10 or better to have better sleep and occupational participation in daily roles. Goal status: INITIAL   ASSESSMENT:  CLINICAL IMPRESSION: Patient is a 58 y.o. male who was seen today for occupational therapy evaluation for ***.  The patient will benefit from outpatient occupational therapy to decrease symptoms, improve functional upper extremity use, and increase quality of life.  PERFORMANCE DEFICITS: in functional skills including {OT physical skills:25468}, cognitive skills including problem solving  and safety awareness, and psychosocial skills including coping strategies, environmental adaptation, habits, and routines and behaviors.   IMPAIRMENTS: are limiting patient from ADLs, IADLs, rest and sleep, and leisure.   COMORBIDITIES: {Comorbidities:25485} that affects occupational performance. Patient will benefit from skilled OT to address above impairments and improve overall function.  MODIFICATION OR ASSISTANCE TO COMPLETE EVALUATION: {OT modification:25474}  OT OCCUPATIONAL PROFILE AND HISTORY: {OT PROFILE AND HISTORY:25484}  CLINICAL DECISION MAKING: {OT CDM:25475}  REHAB POTENTIAL: {rehabpotential:25112}  EVALUATION COMPLEXITY: {Evaluation complexity:25115}      PLAN:  OT FREQUENCY: 1-2x/week  OT DURATION: *** weeks through *** and up to *** total visits as needed   PLANNED INTERVENTIONS: 97535 self care/ADL training, 02889 therapeutic exercise, 97530 therapeutic activity, 97112 neuromuscular re-education, 97140  manual therapy, 97035 ultrasound, [id number] electrical stimulation (manual), [id number] Orthotic Initial, [id number] Orthotic/Prosthetic subsequent, compression bandaging, Dry needling, energy conservation, coping strategies training, and patient/family education  RECOMMENDED OTHER SERVICES: none now   CONSULTED AND AGREED WITH PLAN OF CARE: Patient  PLAN FOR NEXT SESSION:   Review initial HEP and recommendations ***   Melvenia Ada, OTR/L, CHT  01/04/2024, 10:27 AM

## 2024-01-05 ENCOUNTER — Encounter

## 2024-01-05 ENCOUNTER — Encounter: Admitting: Rehabilitative and Restorative Service Providers"

## 2024-01-06 NOTE — Therapy (Signed)
 OUTPATIENT PHYSICAL THERAPY LOWER EXTREMITY TREATMENT   Patient Name: [name] [name] MRN: [id number] DOB:1966-03-20, 58 y.o., male Today's Date: 01/08/2024  END OF SESSION:  PT End of Session - 01/08/24 0933     Visit Number 3    Number of Visits 16    Date for Recertification  02/15/24    Authorization Type [employer] MEDICARE $20 COPAY    Authorization Time Period 12/21/2023-02/15/2024    Authorization - Visit Number 3    Authorization - Number of Visits 16    Progress Note Due on Visit 10    PT Start Time 0933    PT Stop Time 1014    PT Time Calculation (min) 41 min    Activity Tolerance Patient tolerated treatment well    Behavior During Therapy [facility] for tasks assessed/performed            Past Medical History:  Diagnosis Date   Heart murmur    Multiple sclerosis    dx 2014   Neuromuscular disorder (HCC) 10/2012   high probability of MS   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    Other postprocedural status(V45.89)    inguinal herniorrhaphies bilateral    Sleep apnea    cpap   Testosterone  deficiency 11/04/11   Past Surgical History:  Procedure Laterality Date   HERNIA REPAIR Bilateral 1971   LAPAROSCOPIC CHOLECYSTECTOMY  2014   Patient Active Problem List   Diagnosis Date Noted   Right radial head fracture 12/19/2021   Right sided abdominal pain 02/04/2019   Former smoker 01/18/2019   Food allergy  10/24/2016   Other allergic rhinitis 10/24/2016   History of adenomatous polyp of colon 10/23/2014   Multiple sclerosis 11/03/2012   Hypogonadism male 10/06/2011    PCP: Norleen JAYSON Jobs, MD   REFERRING PROVIDER: Lonell Sprang, DO  REFERRING DIAG: [phone] (ICD-10-CM) - Chronic right hip pain R29.898 (ICD-10-CM) - Weakness of right hip M67.951 (ICD-10-CM) - Tendinopathy of right gluteal region  THERAPY DIAG:  Pain in right hip  Stiffness of right hip, not elsewhere classified  Muscle weakness (generalized)  Other  abnormalities of gait and mobility  Unsteadiness on feet  Rationale for Evaluation and Treatment: Rehabilitation  ONSET DATE: 2 years it was worse then finally went to Dr. Sprang 5 months ago  SUBJECTIVE:   SUBJECTIVE STATEMENT: Patient reporting no new soreness or pain recently.   PERTINENT HISTORY: Patient experienced a fall 5 years ago where he had a fall on ice and landed on lateral Rt LE. The fall led to extensive bruising on entire lateral thigh as well as pain with no change over several months. Patient and PCP thought it was an effect from MS (diagnoses in 2014). Patient has received injection for bursitis from PCP within first year with no change in symptoms and is currently undergoing extracorporeal shockwave therapy through Dr. Sprang which is largely improving symptoms. Patient has had no other past surgeries or injuries to Rt LE/hip or surrounding areas.  PAIN:  Are you having pain? Yes: NPRS scale: 1/10 at rest, 12/10 with hard STM  Pain location: Rt lateral hip, adductors/groin Pain description: dull Aggravating factors: deep tissue work  Relieving factors: shock therapy, mobility work   PRECAUTIONS: None  RED FLAGS: None   WEIGHT BEARING RESTRICTIONS: No  FALLS:  Has patient fallen in last 6 months? No  LIVING ENVIRONMENT: Lives with: dog Lives in: House/apartment Stairs: Yes: External: 2 steps; on right going up Has following  equipment at home: Single point cane  OCCUPATION: disability, financial trading  PLOF: Independent and uses single point cane   PATIENT GOALS: to be normal again  NEXT MD VISIT: this Friday and following Friday for shock therapy   OBJECTIVE:  Note: Objective measures were completed at Evaluation unless otherwise noted.  DIAGNOSTIC FINDINGS:  IMPRESSION: Osteopenia. No acute osseous abnormalities.  IMPRESSION: 1. Mild osteoarthritis of the right hip with anterosuperior labral tear. 2. Mild tendinosis of the bilateral  gluteus medius tendons. 3. Mild intramuscular edema within the right adductor magnus and brevis muscles at their origin on the right ischiopubic ramus, suggestive of a low-grade muscle strain.  PATIENT SURVEYS:  PSFS: THE PATIENT SPECIFIC FUNCTIONAL SCALE  Place score of 0-10 (0 = unable to perform activity and 10 = able to perform activity at the same level as before injury or problem)  Activity Date: 12/21/2023    Walking  7    2. Stairs  5    3.     4.      Total Score 6      Total Score = Sum of activity scores/number of activities  Minimally Detectable Change: 3 points (for single activity); 2 points (for average score)  [facility] (nd). The Patient Specific Functional Scale . Retrieved from [email or web address]   COGNITION: Overall cognitive status: Within functional limits for tasks assessed     SENSATION: Light touch: WFL  EDEMA:  Not assessed on eval  MUSCLE LENGTH: Not formally assessed, though hamstring tightness noted with SLR  POSTURE: rounded shoulders, forward head, and increased thoracic kyphosis  PALPATION: Not TTP in Rt glutes or lateral thigh   LOWER EXTREMITY ROM:  ROM Right Eval 12/21/2023 Left Eval 12/21/2023  Hip flexion 2+/5 3+/5  Hip extension 2+/5 3-/5  Hip abduction 3-/5 3-/5 with compensation into hip flexion  Hip adduction    Hip internal rotation    Hip external rotation    Knee flexion 4-/5 4/5  Knee extension 5/5 5/5  Ankle dorsiflexion 3+/5 4-/5  Ankle plantarflexion    Ankle inversion    Ankle eversion     (Blank rows = not tested)  LOWER EXTREMITY MMT:  MMT Right Eval 12/21/2023 Left Eval 12/21/2023  Hip flexion [facility] WFL  Hip extension    Hip abduction    Hip adduction    Hip internal rotation    Hip external rotation    Knee flexion    Knee extension    Ankle dorsiflexion    Ankle plantarflexion    Ankle inversion    Ankle eversion     (Blank rows  = not tested)  LOWER EXTREMITY SPECIAL TESTS:  Hip special tests: Belvie (FABER) test: negative and FADIR negative though patient notes muscle tightness in lateral glutes with FABER  FUNCTIONAL TESTS:  5 times sit to stand: 16.34s using bilat UEs and uncontrolled   GAIT: Distance walked: not formally assessed  Assistive device utilized: Single point cane Level of assistance: supervision  Comments: wide BOS, decreased knee flexion throughout bilaterally; will require further in depth assessment   [facility] PT Assessment - 01/08/24 0001       Functional Gait  Assessment   Gait Level Surface Walks 20 ft, slow speed, abnormal gait pattern, evidence for imbalance or deviates 10-15 in outside of the 12 in walkway width. Requires more than 7 sec to ambulate 20 ft.    Change in Gait Speed Makes only minor adjustments to walking speed, or accomplishes a  change in speed with significant gait deviations, deviates 10-15 in outside the 12 in walkway width, or changes speed but loses balance but is able to recover and continue walking.    Gait with Horizontal Head Turns Performs head turns with moderate changes in gait velocity, slows down, deviates 10-15 in outside 12 in walkway width but recovers, can continue to walk.    Gait with Vertical Head Turns Performs task with moderate change in gait velocity, slows down, deviates 10-15 in outside 12 in walkway width but recovers, can continue to walk.    Gait and Pivot Turn Pivot turns safely in greater than 3 sec and stops with no loss of balance, or pivot turns safely within 3 sec and stops with mild imbalance, requires small steps to catch balance.    Step Over Obstacle Is able to step over one shoe box (4.5 in total height) but must slow down and adjust steps to clear box safely. May require verbal cueing.    Gait with Narrow Base of Support Ambulates less than 4 steps heel to toe or cannot perform without assistance.    Gait with Eyes Closed Walks 20 ft, slow  speed, abnormal gait pattern, evidence for imbalance, deviates 10-15 in outside 12 in walkway width. Requires more than 9 sec to ambulate 20 ft.    Ambulating Backwards Walks 20 ft, slow speed, abnormal gait pattern, evidence for imbalance, deviates 10-15 in outside 12 in walkway width.    Steps Two feet to a stair, must use rail.    Total Score 10    FGA comment: CGA throughout, done without AD, though typically ambulates with SPC                                                                                                                                        TREATMENT DATE:  01/05/2024 TherEx:  Nustep level 4 for 8 minutes with bilat LE  PT discussed HEP, addition of bands, upcoming progressions with HEP, and importance of performing physical activity and exercise   Physical Performance:  FGA with results noted above  Discussed results with patient   12/31/2023 TherEx: Nustep level 4 for 8 minutes, 4 with bilat UE/LE and 4 with bilat LE only  Glute bridges 1x3 with PT placement of feet on table, patient performing with adduction and increased speed throughout reps; verbal cues given for both with patient having moderate carryover for speed of movement, but not adduction  PT placed yellow TB around knees for tactile cue to decrease adduction moment which improved performance, 2x8 ; intermittent reminders given for speed of movement  Yellow TB given to patient for HEP  Discussed adding calf raises and toe raises to HEP, patient performing 1x10 of each with difficulty on Rt LE secondary to MS diagnosis   Neuro Re-Ed:  General static balance assessment performed in parallel bars with CGA  Normal stance  for 20s with no sway noted  Narrow stance for 20s with no sway noted  Tandem stance with mod-large increase in sway for 20s each  Unable to perform SLS safely   Narrow stance with eyes closed with CGA 1x20s with moderate increase in sway   Semi-tandem stance 2x20s each side with  CGA  PT discussed 3 balance systems, how they interact, and how they may be impacted by MS diagnosis   12/21/2023 TherEx:  HEP handout provided with patient performing one set of each exercise for appropriate form. Verbal and tactile cues required.   Self-Care:  PT discussed POC, osteopenia and importance of weightbearing and strengthening activities, muscle attachments, imagine results    PATIENT EDUCATION:  Education details: HEP, POC, osteopenia Person educated: Patient Education method: Explanation, Demonstration, Tactile cues, Verbal cues, and Handouts Education comprehension: verbalized understanding, returned demonstration, verbal cues required, tactile cues required, and needs further education  HOME EXERCISE PROGRAM: Access Code: [email or web address] URL: [email or web address] Date: 12/21/2023 Prepared by: Susannah Daring  Exercises - Supine Bridge  - 1 x daily - 7 x weekly - 2 sets - 10 reps - 3s hold - Standing Hip Abduction with Counter Support  - 1 x daily - 7 x weekly - 2 sets - 10 reps - Standing Hip Extension with Counter Support  - 1 x daily - 7 x weekly - 2 sets - 10 reps - Mini Squat with Counter Support  - 1 x daily - 7 x weekly - 2 sets - 10 reps - Seated Hamstring Stretch  - 1 x daily - 7 x weekly - 2 sets - 30s hold  ASSESSMENT:  CLINICAL IMPRESSION: Patient arrived to session noting rough week, though still able to complete HEP. Patient tolerated all activities this date, including FGA without AD. Patient is a high fall risk, has large deficits in general strength and endurance, and will benefit from continued skilled PT.    OBJECTIVE IMPAIRMENTS: Abnormal gait, decreased activity tolerance, decreased balance, decreased coordination, decreased endurance, decreased mobility, difficulty walking, decreased ROM, decreased strength, impaired flexibility, improper body mechanics, postural dysfunction, and pain.   ACTIVITY LIMITATIONS: lifting, standing, squatting,  stairs, and transfers  PARTICIPATION LIMITATIONS: community activity and occupation  PERSONAL FACTORS: Fitness, Past/current experiences, Time since onset of injury/illness/exacerbation, and 3+ comorbidities: multiple sclerosis, heart murmur, sleep apnea are also affecting patient's functional outcome.   REHAB POTENTIAL: Good  CLINICAL DECISION MAKING: Evolving/moderate complexity  EVALUATION COMPLEXITY: Moderate   GOALS: Goals reviewed with patient? Yes  SHORT TERM GOALS: Target date: 01/11/2024 Patient will show compliance with initial HEP.  Baseline: Goal status: INITIAL  2.  Patient will report pain levels no greater than 4/10 in order to show improved overall quality of life. Baseline:  Goal status: INITIAL   LONG TERM GOALS: Target date: 02/15/2024  Patient will be independent with final HEP in order to maintain and progress upon functional gains made within PT.  Baseline:  Goal status: INITIAL  2.  Patient will report pain levels no greater than 2/10 in order to show improved overall quality of life. Baseline:  Goal status: INITIAL  3.  Patient will increase PSFS to at least 8 in order to show a significant improvement in subjective disability rating. Baseline:  Goal status: INITIAL  4.  Patient will improve bilat hip abduction MMT to at least 4/5 in order to improve biomechanics with functional mobility. Baseline:  Goal status: INITIAL  5.  Patient will improve bilat hip extension MMT to at  least 4/5 in order to improve biomechanics with functional mobility. Baseline:  Goal status: INITIAL  6.  Patient will improve 5xSTS to at least 12s in order to decrease risk of falling.  Baseline:  Goal status: INITIAL   PLAN:  PT FREQUENCY: 1-2x/week  PT DURATION: 8 weeks  PLANNED INTERVENTIONS: 97164- PT Re-evaluation, 97750- Physical Performance Testing, 97110-Therapeutic exercises, 97530- Therapeutic activity, [id number]- Neuromuscular re-education, 97535- Self  Care, 02859- Manual therapy, [id number]- Gait training, [phone]- Canalith repositioning, H9716- Electrical stimulation (unattended), [phone]- Electrical stimulation (manual), [id number]- Vasopneumatic device, [id number]- Ultrasound, [id number]- Traction (mechanical), [id number]- Ionotophoresis 4mg /ml Dexamethasone, 79439 (1-2 muscles), 20561 (3+ muscles)- Dry Needling, Patient/Family education, Balance training, Stair training, Taping, Joint mobilization, Joint manipulation, Spinal manipulation, Spinal mobilization, Scar mobilization, Vestibular training, DME instructions, Cryotherapy, and Moist heat  PLAN FOR NEXT SESSION:  review changes made to HEP, generalized LE strengthening, assessment of gait with and without SPC, gait training with cane    Susannah Daring, PT, DPT 01/08/24 10:27 AM   Date of referral: 11/30/2023 Referring provider: Lonell Sprang, DO Referring diagnosis? M25.551,G89.29 (ICD-10-CM) - Chronic right hip pain R29.898 (ICD-10-CM) - Weakness of right hip M67.951 (ICD-10-CM) - Tendinopathy of right gluteal region Treatment diagnosis? (if different than referring diagnosis) M25.551, M25.651, M62.81, R26.89, R26.81  What was this (referring dx) caused by? Fall and Ongoing Issue  Lysle of Condition: Chronic (continuous duration > 3 months)   Laterality: Rt  Current Functional Measure Score: Patient Specific Functional Scale 6  Objective measurements identify impairments when they are compared to normal values, the uninvolved extremity, and prior level of function.  [x]  Yes  []  No  Objective assessment of functional ability: Moderate functional limitations   Briefly describe symptoms: pain with palpation, weakness, unsteady on feet   How did symptoms start: patient slipped on ice several years ago and symptoms never dissipated  Average pain intensity:  Last 24 hours: 2/10  Past week: 5/10  How often does the pt experience symptoms? Frequently  How much have the symptoms interfered with usual daily  activities? Moderately  How has condition changed since care began at this facility? NA - initial visit  In general, how is the patients overall health? Fair   BACK PAIN (STarT Back Screening Tool) No

## 2024-01-08 ENCOUNTER — Ambulatory Visit

## 2024-01-08 DIAGNOSIS — M25551 Pain in right hip: Secondary | ICD-10-CM | POA: Diagnosis not present

## 2024-01-08 DIAGNOSIS — R2689 Other abnormalities of gait and mobility: Secondary | ICD-10-CM | POA: Diagnosis not present

## 2024-01-08 DIAGNOSIS — R2681 Unsteadiness on feet: Secondary | ICD-10-CM | POA: Diagnosis not present

## 2024-01-08 DIAGNOSIS — M6281 Muscle weakness (generalized): Secondary | ICD-10-CM | POA: Diagnosis not present

## 2024-01-08 DIAGNOSIS — M25651 Stiffness of right hip, not elsewhere classified: Secondary | ICD-10-CM | POA: Diagnosis not present

## 2024-01-11 NOTE — Therapy (Signed)
 OUTPATIENT PHYSICAL THERAPY LOWER EXTREMITY TREATMENT   Patient Name: [name] [name] MRN: [id number] DOB:07/20/1965, 58 y.o., male Today's Date: 01/12/2024  END OF SESSION:  PT End of Session - 01/12/24 1105     Visit Number 4    Number of Visits 16    Date for Recertification  02/15/24    Authorization Type [employer] MEDICARE $20 COPAY    Authorization Time Period 12/21/2023-02/15/2024    Authorization - Visit Number 4    Authorization - Number of Visits 16    Progress Note Due on Visit 10    PT Start Time 1105    PT Stop Time 1146    PT Time Calculation (min) 41 min    Activity Tolerance Patient tolerated treatment well    Behavior During Therapy [facility] for tasks assessed/performed           Past Medical History:  Diagnosis Date   Heart murmur    Multiple sclerosis    dx 2014   Neuromuscular disorder (HCC) 10/2012   high probability of MS   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    Other postprocedural status(V45.89)    inguinal herniorrhaphies bilateral    Sleep apnea    cpap   Testosterone  deficiency 11/04/11   Past Surgical History:  Procedure Laterality Date   HERNIA REPAIR Bilateral 1971   LAPAROSCOPIC CHOLECYSTECTOMY  2014   Patient Active Problem List   Diagnosis Date Noted   Right radial head fracture 12/19/2021   Right sided abdominal pain 02/04/2019   Former smoker 01/18/2019   Food allergy  10/24/2016   Other allergic rhinitis 10/24/2016   History of adenomatous polyp of colon 10/23/2014   Multiple sclerosis 11/03/2012   Hypogonadism male 10/06/2011    PCP: Norleen JAYSON Jobs, MD   REFERRING PROVIDER: Lonell Sprang, DO  REFERRING DIAG: [phone] (ICD-10-CM) - Chronic right hip pain R29.898 (ICD-10-CM) - Weakness of right hip M67.951 (ICD-10-CM) - Tendinopathy of right gluteal region  THERAPY DIAG:  Pain in right hip  Stiffness of right hip, not elsewhere classified  Muscle weakness (generalized)  Other  abnormalities of gait and mobility  Unsteadiness on feet  Rationale for Evaluation and Treatment: Rehabilitation  ONSET DATE: 2 years it was worse then finally went to Dr. Sprang 5 months ago  SUBJECTIVE:   SUBJECTIVE STATEMENT: Patient reports slight discomfort and hoping to schedule one more session of shockwave with Dr. Sprang.    PERTINENT HISTORY: Patient experienced a fall 5 years ago where he had a fall on ice and landed on lateral Rt LE. The fall led to extensive bruising on entire lateral thigh as well as pain with no change over several months. Patient and PCP thought it was an effect from MS (diagnoses in 2014). Patient has received injection for bursitis from PCP within first year with no change in symptoms and is currently undergoing extracorporeal shockwave therapy through Dr. Sprang which is largely improving symptoms. Patient has had no other past surgeries or injuries to Rt LE/hip or surrounding areas.  PAIN:  Are you having pain? Yes: NPRS scale: 1/10 at rest, 12/10 with hard STM  Pain location: Rt lateral hip, adductors/groin Pain description: dull Aggravating factors: deep tissue work  Relieving factors: shock therapy, mobility work   PRECAUTIONS: None  RED FLAGS: None   WEIGHT BEARING RESTRICTIONS: No  FALLS:  Has patient fallen in last 6 months? No  LIVING ENVIRONMENT: Lives with: dog Lives in: House/apartment Stairs: Yes: External:  2 steps; on right going up Has following equipment at home: Single point cane  OCCUPATION: disability, financial trading  PLOF: Independent and uses single point cane   PATIENT GOALS: to be normal again  NEXT MD VISIT: this Friday and following Friday for shock therapy   OBJECTIVE:  Note: Objective measures were completed at Evaluation unless otherwise noted.  DIAGNOSTIC FINDINGS:  IMPRESSION: Osteopenia. No acute osseous abnormalities.  IMPRESSION: 1. Mild osteoarthritis of the right hip with anterosuperior  labral tear. 2. Mild tendinosis of the bilateral gluteus medius tendons. 3. Mild intramuscular edema within the right adductor magnus and brevis muscles at their origin on the right ischiopubic ramus, suggestive of a low-grade muscle strain.  PATIENT SURVEYS:  PSFS: THE PATIENT SPECIFIC FUNCTIONAL SCALE  Place score of 0-10 (0 = unable to perform activity and 10 = able to perform activity at the same level as before injury or problem)  Activity Date: 12/21/2023    Walking  7    2. Stairs  5    3.     4.      Total Score 6      Total Score = Sum of activity scores/number of activities  Minimally Detectable Change: 3 points (for single activity); 2 points (for average score)  [facility] (nd). The Patient Specific Functional Scale . Retrieved from [email or web address]   COGNITION: Overall cognitive status: Within functional limits for tasks assessed     SENSATION: Light touch: WFL  EDEMA:  Not assessed on eval  MUSCLE LENGTH: Not formally assessed, though hamstring tightness noted with SLR  POSTURE: rounded shoulders, forward head, and increased thoracic kyphosis  PALPATION: Not TTP in Rt glutes or lateral thigh   LOWER EXTREMITY ROM:  ROM Right Eval 12/21/2023 Left Eval 12/21/2023  Hip flexion 2+/5 3+/5  Hip extension 2+/5 3-/5  Hip abduction 3-/5 3-/5 with compensation into hip flexion  Hip adduction    Hip internal rotation    Hip external rotation    Knee flexion 4-/5 4/5  Knee extension 5/5 5/5  Ankle dorsiflexion 3+/5 4-/5  Ankle plantarflexion    Ankle inversion    Ankle eversion     (Blank rows = not tested)  LOWER EXTREMITY MMT:  MMT Right Eval 12/21/2023 Left Eval 12/21/2023  Hip flexion [facility] WFL  Hip extension    Hip abduction    Hip adduction    Hip internal rotation    Hip external rotation    Knee flexion    Knee extension    Ankle dorsiflexion    Ankle plantarflexion     Ankle inversion    Ankle eversion     (Blank rows = not tested)  LOWER EXTREMITY SPECIAL TESTS:  Hip special tests: Belvie (FABER) test: negative and FADIR negative though patient notes muscle tightness in lateral glutes with FABER  FUNCTIONAL TESTS:  5 times sit to stand: 16.34s using bilat UEs and uncontrolled   GAIT: Distance walked: not formally assessed  Assistive device utilized: Single point cane Level of assistance: supervision  Comments: wide BOS, decreased knee flexion throughout bilaterally; will require further in depth assessment   [facility] PT Assessment - 01/08/24 0001       Functional Gait  Assessment   Gait Level Surface Walks 20 ft, slow speed, abnormal gait pattern, evidence for imbalance or deviates 10-15 in outside of the 12 in walkway width. Requires more than 7 sec to ambulate 20 ft.    Change in Gait Speed Makes only  minor adjustments to walking speed, or accomplishes a change in speed with significant gait deviations, deviates 10-15 in outside the 12 in walkway width, or changes speed but loses balance but is able to recover and continue walking.    Gait with Horizontal Head Turns Performs head turns with moderate changes in gait velocity, slows down, deviates 10-15 in outside 12 in walkway width but recovers, can continue to walk.    Gait with Vertical Head Turns Performs task with moderate change in gait velocity, slows down, deviates 10-15 in outside 12 in walkway width but recovers, can continue to walk.    Gait and Pivot Turn Pivot turns safely in greater than 3 sec and stops with no loss of balance, or pivot turns safely within 3 sec and stops with mild imbalance, requires small steps to catch balance.    Step Over Obstacle Is able to step over one shoe box (4.5 in total height) but must slow down and adjust steps to clear box safely. May require verbal cueing.    Gait with Narrow Base of Support Ambulates less than 4 steps heel to toe or cannot perform without  assistance.    Gait with Eyes Closed Walks 20 ft, slow speed, abnormal gait pattern, evidence for imbalance, deviates 10-15 in outside 12 in walkway width. Requires more than 9 sec to ambulate 20 ft.    Ambulating Backwards Walks 20 ft, slow speed, abnormal gait pattern, evidence for imbalance, deviates 10-15 in outside 12 in walkway width.    Steps Two feet to a stair, must use rail.    Total Score 10    FGA comment: CGA throughout, done without AD, though typically ambulates with [facility]                                                                                                                                        TREATMENT DATE:  01/12/2024 TherEx:  Recumbent bike level 3 for 8 minutes  Bilat leg press 1x3 with large genu recurvatem moment on Rt LE with knee extension  Seated LAQ with focus on slow and controlled movement 2x12 each LE with 3s eccentric  Intermittent verbal cues for speed of movement  Sit<>stands with red TB around knees for abduction cue 1x4; changed to staggered stance with Lt LE fwd to increase weight bearing through Rt LE 1x10  Verbal cues required for knee extension upon standing with minimal carryover from patient  Tap down squats with red TB around knees with staggered stance keeping Lt LE fwd to increase challenge in Rt LE, 1x10 with deficits in performance worsening with time and repetitions Seated marches 2x6 with decreased ROM/strength on Rt; compensatory movement with Lt lateral trunk lean and momentum Discussed adding sidelying hip flexion to HEP in order to improve strength  PT discussed returning to bridges with yellow TB in order to increase challenge during HEP   01/05/2024  TherEx:  Nustep level 4 for 8 minutes with bilat LE  PT discussed HEP, addition of bands, upcoming progressions with HEP, and importance of performing physical activity and exercise   Physical Performance:  FGA with results noted above  Discussed results with patient    12/31/2023 TherEx: Nustep level 4 for 8 minutes, 4 with bilat UE/LE and 4 with bilat LE only  Glute bridges 1x3 with PT placement of feet on table, patient performing with adduction and increased speed throughout reps; verbal cues given for both with patient having moderate carryover for speed of movement, but not adduction  PT placed yellow TB around knees for tactile cue to decrease adduction moment which improved performance, 2x8 ; intermittent reminders given for speed of movement  Yellow TB given to patient for HEP  Discussed adding calf raises and toe raises to HEP, patient performing 1x10 of each with difficulty on Rt LE secondary to MS diagnosis   Neuro Re-Ed:  General static balance assessment performed in parallel bars with CGA  Normal stance for 20s with no sway noted  Narrow stance for 20s with no sway noted  Tandem stance with mod-large increase in sway for 20s each  Unable to perform SLS safely   Narrow stance with eyes closed with CGA 1x20s with moderate increase in sway   Semi-tandem stance 2x20s each side with CGA  PT discussed 3 balance systems, how they interact, and how they may be impacted by MS diagnosis   12/21/2023 TherEx:  HEP handout provided with patient performing one set of each exercise for appropriate form. Verbal and tactile cues required.   Self-Care:  PT discussed POC, osteopenia and importance of weightbearing and strengthening activities, muscle attachments, imagine results    PATIENT EDUCATION:  Education details: HEP, POC, osteopenia Person educated: Patient Education method: Explanation, Demonstration, Tactile cues, Verbal cues, and Handouts Education comprehension: verbalized understanding, returned demonstration, verbal cues required, tactile cues required, and needs further education  HOME EXERCISE PROGRAM: Access Code: [email or web address] URL: [email or web address] Date: 12/21/2023 Prepared by: Susannah Daring  Exercises - Supine  Bridge  - 1 x daily - 7 x weekly - 2 sets - 10 reps - 3s hold - Standing Hip Abduction with Counter Support  - 1 x daily - 7 x weekly - 2 sets - 10 reps - Standing Hip Extension with Counter Support  - 1 x daily - 7 x weekly - 2 sets - 10 reps - Mini Squat with Counter Support  - 1 x daily - 7 x weekly - 2 sets - 10 reps - Seated Hamstring Stretch  - 1 x daily - 7 x weekly - 2 sets - 30s hold  ASSESSMENT:  CLINICAL IMPRESSION: Patient arrived to session noting slight increase in discomfort in Rt LE, however, no overall deficits noted due to discomfort. Patient requires multiple verbal cues for attention and appropriate speed/performance throughout session and will benefit from focus on strengthening for bilat LE. Patient tolerated all activities with no complaints of pain, only muscular fatigue. Patient will continue to benefit from skilled PT.   OBJECTIVE IMPAIRMENTS: Abnormal gait, decreased activity tolerance, decreased balance, decreased coordination, decreased endurance, decreased mobility, difficulty walking, decreased ROM, decreased strength, impaired flexibility, improper body mechanics, postural dysfunction, and pain.   ACTIVITY LIMITATIONS: lifting, standing, squatting, stairs, and transfers  PARTICIPATION LIMITATIONS: community activity and occupation  PERSONAL FACTORS: Fitness, Past/current experiences, Time since onset of injury/illness/exacerbation, and 3+ comorbidities: multiple sclerosis, heart murmur, sleep apnea are also affecting patient's functional  outcome.   REHAB POTENTIAL: Good  CLINICAL DECISION MAKING: Evolving/moderate complexity  EVALUATION COMPLEXITY: Moderate   GOALS: Goals reviewed with patient? Yes  SHORT TERM GOALS: Target date: 01/11/2024 Patient will show compliance with initial HEP.  Baseline: Goal status: ongoing, 01/12/2024  2.  Patient will report pain levels no greater than 4/10 in order to show improved overall quality of life. Baseline:   Goal status: GOAL MET, 01/12/2024   LONG TERM GOALS: Target date: 02/15/2024  Patient will be independent with final HEP in order to maintain and progress upon functional gains made within PT.  Baseline:  Goal status: INITIAL  2.  Patient will report pain levels no greater than 2/10 in order to show improved overall quality of life. Baseline:  Goal status: INITIAL  3.  Patient will increase PSFS to at least 8 in order to show a significant improvement in subjective disability rating. Baseline:  Goal status: INITIAL  4.  Patient will improve bilat hip abduction MMT to at least 4/5 in order to improve biomechanics with functional mobility. Baseline:  Goal status: INITIAL  5.  Patient will improve bilat hip extension MMT to at least 4/5 in order to improve biomechanics with functional mobility. Baseline:  Goal status: INITIAL  6.  Patient will improve 5xSTS to at least 12s in order to decrease risk of falling.  Baseline:  Goal status: INITIAL   PLAN:  PT FREQUENCY: 1-2x/week  PT DURATION: 8 weeks  PLANNED INTERVENTIONS: 97164- PT Re-evaluation, 97750- Physical Performance Testing, 97110-Therapeutic exercises, 97530- Therapeutic activity, [id number]- Neuromuscular re-education, 97535- Self Care, [id number]- Manual therapy, [id number]- Gait training, [phone]- Canalith repositioning, [id number]- Electrical stimulation (unattended), [phone]- Electrical stimulation (manual), [id number]- Vasopneumatic device, [id number]- Ultrasound, [id number]- Traction (mechanical), [id number]- Ionotophoresis 4mg /ml Dexamethasone, [id number] (1-2 muscles), 20561 (3+ muscles)- Dry Needling, Patient/Family education, Balance training, Stair training, Taping, Joint mobilization, Joint manipulation, Spinal manipulation, Spinal mobilization, Scar mobilization, Vestibular training, DME instructions, Cryotherapy, and Moist heat  PLAN FOR NEXT SESSION:   review changes made to HEP, generalized LE strengthening, assessment of gait with and without SPC, gait  training with cane    Susannah Daring, PT, DPT 01/12/24 3:39 PM   Date of referral: 11/30/2023 Referring provider: Lonell Sprang, DO Referring diagnosis? M25.551,G89.29 (ICD-10-CM) - Chronic right hip pain R29.898 (ICD-10-CM) - Weakness of right hip M67.951 (ICD-10-CM) - Tendinopathy of right gluteal region Treatment diagnosis? (if different than referring diagnosis) M25.551, M25.651, M62.81, R26.89, R26.81  What was this (referring dx) caused by? Fall and Ongoing Issue  Lysle of Condition: Chronic (continuous duration > 3 months)   Laterality: Rt  Current Functional Measure Score: Patient Specific Functional Scale 6  Objective measurements identify impairments when they are compared to normal values, the uninvolved extremity, and prior level of function.  [x]  Yes  []  No  Objective assessment of functional ability: Moderate functional limitations   Briefly describe symptoms: pain with palpation, weakness, unsteady on feet   How did symptoms start: patient slipped on ice several years ago and symptoms never dissipated  Average pain intensity:  Last 24 hours: 2/10  Past week: 5/10  How often does the pt experience symptoms? Frequently  How much have the symptoms interfered with usual daily activities? Moderately  How has condition changed since care began at this facility? NA - initial visit  In general, how is the patients overall health? Fair   BACK PAIN (STarT Back Screening Tool) No

## 2024-01-12 ENCOUNTER — Ambulatory Visit

## 2024-01-12 DIAGNOSIS — R2689 Other abnormalities of gait and mobility: Secondary | ICD-10-CM | POA: Diagnosis not present

## 2024-01-12 DIAGNOSIS — M6281 Muscle weakness (generalized): Secondary | ICD-10-CM | POA: Diagnosis not present

## 2024-01-12 DIAGNOSIS — M25651 Stiffness of right hip, not elsewhere classified: Secondary | ICD-10-CM

## 2024-01-12 DIAGNOSIS — R2681 Unsteadiness on feet: Secondary | ICD-10-CM | POA: Diagnosis not present

## 2024-01-12 DIAGNOSIS — M25551 Pain in right hip: Secondary | ICD-10-CM

## 2024-01-13 NOTE — Therapy (Signed)
 OUTPATIENT PHYSICAL THERAPY LOWER EXTREMITY TREATMENT   Patient Name: [name] [name] MRN: [id number] DOB:1965/11/14, 58 y.o., male Today's Date: 01/14/2024  END OF SESSION:  PT End of Session - 01/14/24 1353     Visit Number 5    Number of Visits 16    Date for Recertification  02/15/24    Authorization Type [employer] MEDICARE $20 COPAY    Authorization Time Period 12/21/2023-02/15/2024    Authorization - Number of Visits 16    Progress Note Due on Visit 10    PT Start Time 1349    PT Stop Time 1433    PT Time Calculation (min) 44 min    Activity Tolerance Patient tolerated treatment well    Behavior During Therapy [facility] for tasks assessed/performed            Past Medical History:  Diagnosis Date   Heart murmur    Multiple sclerosis    dx 2014   Neuromuscular disorder (HCC) 10/2012   high probability of MS   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    Other postprocedural status(V45.89)    inguinal herniorrhaphies bilateral    Sleep apnea    cpap   Testosterone  deficiency 11/04/11   Past Surgical History:  Procedure Laterality Date   HERNIA REPAIR Bilateral 1971   LAPAROSCOPIC CHOLECYSTECTOMY  2014   Patient Active Problem List   Diagnosis Date Noted   Right radial head fracture 12/19/2021   Right sided abdominal pain 02/04/2019   Former smoker 01/18/2019   Food allergy  10/24/2016   Other allergic rhinitis 10/24/2016   History of adenomatous polyp of colon 10/23/2014   Multiple sclerosis 11/03/2012   Hypogonadism male 10/06/2011    PCP: Norleen JAYSON Jobs, MD   REFERRING PROVIDER: Lonell Sprang, DO  REFERRING DIAG: [phone] (ICD-10-CM) - Chronic right hip pain R29.898 (ICD-10-CM) - Weakness of right hip M67.951 (ICD-10-CM) - Tendinopathy of right gluteal region  THERAPY DIAG:  Pain in right hip  Stiffness of right hip, not elsewhere classified  Muscle weakness (generalized)  Other abnormalities of gait and  mobility  Unsteadiness on feet  Rationale for Evaluation and Treatment: Rehabilitation  ONSET DATE: 2 years it was worse then finally went to Dr. Sprang 5 months ago  SUBJECTIVE:   SUBJECTIVE STATEMENT: Patient reports not having added yellow TB to bridge exercise though did perform sidelying hip flexion.     PERTINENT HISTORY: Patient experienced a fall 5 years ago where he had a fall on ice and landed on lateral Rt LE. The fall led to extensive bruising on entire lateral thigh as well as pain with no change over several months. Patient and PCP thought it was an effect from MS (diagnoses in 2014). Patient has received injection for bursitis from PCP within first year with no change in symptoms and is currently undergoing extracorporeal shockwave therapy through Dr. Sprang which is largely improving symptoms. Patient has had no other past surgeries or injuries to Rt LE/hip or surrounding areas.  PAIN:  Are you having pain? Yes: NPRS scale: 0/10   Pain location: Rt lateral hip, adductors/groin Pain description: dull Aggravating factors: deep tissue work  Relieving factors: shock therapy, mobility work   PRECAUTIONS: None  RED FLAGS: None   WEIGHT BEARING RESTRICTIONS: No  FALLS:  Has patient fallen in last 6 months? No  LIVING ENVIRONMENT: Lives with: dog Lives in: House/apartment Stairs: Yes: External: 2 steps; on right going up Has following equipment at home:  Single point cane  OCCUPATION: disability, financial trading  PLOF: Independent and uses single point cane   PATIENT GOALS: to be normal again  NEXT MD VISIT: this Friday and following Friday for shock therapy   OBJECTIVE:  Note: Objective measures were completed at Evaluation unless otherwise noted.  DIAGNOSTIC FINDINGS:  IMPRESSION: Osteopenia. No acute osseous abnormalities.  IMPRESSION: 1. Mild osteoarthritis of the right hip with anterosuperior labral tear. 2. Mild tendinosis of the bilateral  gluteus medius tendons. 3. Mild intramuscular edema within the right adductor magnus and brevis muscles at their origin on the right ischiopubic ramus, suggestive of a low-grade muscle strain.  PATIENT SURVEYS:  PSFS: THE PATIENT SPECIFIC FUNCTIONAL SCALE  Place score of 0-10 (0 = unable to perform activity and 10 = able to perform activity at the same level as before injury or problem)  Activity Date: 12/21/2023    Walking  7    2. Stairs  5    3.     4.      Total Score 6      Total Score = Sum of activity scores/number of activities  Minimally Detectable Change: 3 points (for single activity); 2 points (for average score)  [facility] (nd). The Patient Specific Functional Scale . Retrieved from [email or web address]   COGNITION: Overall cognitive status: Within functional limits for tasks assessed     SENSATION: Light touch: WFL  EDEMA:  Not assessed on eval  MUSCLE LENGTH: Not formally assessed, though hamstring tightness noted with SLR  POSTURE: rounded shoulders, forward head, and increased thoracic kyphosis  PALPATION: Not TTP in Rt glutes or lateral thigh   LOWER EXTREMITY ROM:  ROM Right Eval 12/21/2023 Left Eval 12/21/2023  Hip flexion 2+/5 3+/5  Hip extension 2+/5 3-/5  Hip abduction 3-/5 3-/5 with compensation into hip flexion  Hip adduction    Hip internal rotation    Hip external rotation    Knee flexion 4-/5 4/5  Knee extension 5/5 5/5  Ankle dorsiflexion 3+/5 4-/5  Ankle plantarflexion    Ankle inversion    Ankle eversion     (Blank rows = not tested)  LOWER EXTREMITY MMT:  MMT Right Eval 12/21/2023 Left Eval 12/21/2023  Hip flexion [facility] WFL  Hip extension    Hip abduction    Hip adduction    Hip internal rotation    Hip external rotation    Knee flexion    Knee extension    Ankle dorsiflexion    Ankle plantarflexion    Ankle inversion    Ankle eversion     (Blank rows  = not tested)  LOWER EXTREMITY SPECIAL TESTS:  Hip special tests: Belvie (FABER) test: negative and FADIR negative though patient notes muscle tightness in lateral glutes with FABER  FUNCTIONAL TESTS:  5 times sit to stand: 16.34s using bilat UEs and uncontrolled   GAIT: Distance walked: not formally assessed  Assistive device utilized: Single point cane Level of assistance: supervision  Comments: wide BOS, decreased knee flexion throughout bilaterally; will require further in depth assessment   [facility] PT Assessment - 01/08/24 0001       Functional Gait  Assessment   Gait Level Surface Walks 20 ft, slow speed, abnormal gait pattern, evidence for imbalance or deviates 10-15 in outside of the 12 in walkway width. Requires more than 7 sec to ambulate 20 ft.    Change in Gait Speed Makes only minor adjustments to walking speed, or accomplishes a change in speed  with significant gait deviations, deviates 10-15 in outside the 12 in walkway width, or changes speed but loses balance but is able to recover and continue walking.    Gait with Horizontal Head Turns Performs head turns with moderate changes in gait velocity, slows down, deviates 10-15 in outside 12 in walkway width but recovers, can continue to walk.    Gait with Vertical Head Turns Performs task with moderate change in gait velocity, slows down, deviates 10-15 in outside 12 in walkway width but recovers, can continue to walk.    Gait and Pivot Turn Pivot turns safely in greater than 3 sec and stops with no loss of balance, or pivot turns safely within 3 sec and stops with mild imbalance, requires small steps to catch balance.    Step Over Obstacle Is able to step over one shoe box (4.5 in total height) but must slow down and adjust steps to clear box safely. May require verbal cueing.    Gait with Narrow Base of Support Ambulates less than 4 steps heel to toe or cannot perform without assistance.    Gait with Eyes Closed Walks 20 ft, slow  speed, abnormal gait pattern, evidence for imbalance, deviates 10-15 in outside 12 in walkway width. Requires more than 9 sec to ambulate 20 ft.    Ambulating Backwards Walks 20 ft, slow speed, abnormal gait pattern, evidence for imbalance, deviates 10-15 in outside 12 in walkway width.    Steps Two feet to a stair, must use rail.    Total Score 10    FGA comment: CGA throughout, done without AD, though typically ambulates with [facility]                                                                                                                                        TREATMENT DATE:  01/14/2024 TherEx:  Recumbent bike level 3 for 8 minutes  Sidelying hip flexion with PT assist for appropriate positioning, verbal cues for full hip extension with appropriate carryover 2x8 each LE  Supine abduction/adduction 2x8 each side; PT assist for AAROM during first set for both LE, transitioned to AROM  Intermittent verbal cues to decrease speed of movement  Supine ball squeezes for adduction 2x10 with 3s squeeze Supine bent knee fallouts 2x10 each side; verbal cues for speed of movement with appropriate carryover   Gait Training:  PT educating patient and demonstrating 2-point technique with SPC on Lt side in order to increase BOS and support appropriate gait mechanics with arm swing ; patient attempted, though unable to perform prior to end of session   01/12/2024 TherEx:  Recumbent bike level 3 for 8 minutes  Bilat leg press 1x3 with large genu recurvatem moment on Rt LE with knee extension  Seated LAQ with focus on slow and controlled movement 2x12 each LE with 3s eccentric  Intermittent verbal cues for speed of movement  Sit<>stands  with red TB around knees for abduction cue 1x4; changed to staggered stance with Lt LE fwd to increase weight bearing through Rt LE 1x10  Verbal cues required for knee extension upon standing with minimal carryover from patient  Tap down squats with red TB around  knees with staggered stance keeping Lt LE fwd to increase challenge in Rt LE, 1x10 with deficits in performance worsening with time and repetitions Seated marches 2x6 with decreased ROM/strength on Rt; compensatory movement with Lt lateral trunk lean and momentum Discussed adding sidelying hip flexion to HEP in order to improve strength  PT discussed returning to bridges with yellow TB in order to increase challenge during HEP   01/05/2024 TherEx:  Nustep level 4 for 8 minutes with bilat LE  PT discussed HEP, addition of bands, upcoming progressions with HEP, and importance of performing physical activity and exercise   Physical Performance:  FGA with results noted above  Discussed results with patient   12/31/2023 TherEx: Nustep level 4 for 8 minutes, 4 with bilat UE/LE and 4 with bilat LE only  Glute bridges 1x3 with PT placement of feet on table, patient performing with adduction and increased speed throughout reps; verbal cues given for both with patient having moderate carryover for speed of movement, but not adduction  PT placed yellow TB around knees for tactile cue to decrease adduction moment which improved performance, 2x8 ; intermittent reminders given for speed of movement  Yellow TB given to patient for HEP  Discussed adding calf raises and toe raises to HEP, patient performing 1x10 of each with difficulty on Rt LE secondary to MS diagnosis   Neuro Re-Ed:  General static balance assessment performed in parallel bars with CGA  Normal stance for 20s with no sway noted  Narrow stance for 20s with no sway noted  Tandem stance with mod-large increase in sway for 20s each  Unable to perform SLS safely   Narrow stance with eyes closed with CGA 1x20s with moderate increase in sway   Semi-tandem stance 2x20s each side with CGA  PT discussed 3 balance systems, how they interact, and how they may be impacted by MS diagnosis   12/21/2023 TherEx:  HEP handout provided with patient  performing one set of each exercise for appropriate form. Verbal and tactile cues required.   Self-Care:  PT discussed POC, osteopenia and importance of weightbearing and strengthening activities, muscle attachments, imagine results    PATIENT EDUCATION:  Education details: HEP, POC, osteopenia Person educated: Patient Education method: Explanation, Demonstration, Tactile cues, Verbal cues, and Handouts Education comprehension: verbalized understanding, returned demonstration, verbal cues required, tactile cues required, and needs further education  HOME EXERCISE PROGRAM: Access Code: [email or web address] URL: [email or web address] Date: 12/21/2023 Prepared by: Susannah Daring  Exercises - Supine Bridge  - 1 x daily - 7 x weekly - 2 sets - 10 reps - 3s hold - Standing Hip Abduction with Counter Support  - 1 x daily - 7 x weekly - 2 sets - 10 reps - Standing Hip Extension with Counter Support  - 1 x daily - 7 x weekly - 2 sets - 10 reps - Mini Squat with Counter Support  - 1 x daily - 7 x weekly - 2 sets - 10 reps - Seated Hamstring Stretch  - 1 x daily - 7 x weekly - 2 sets - 30s hold  ASSESSMENT:  CLINICAL IMPRESSION: Patient arrived to session noting no increased pain. Patient continues to have deficits with LE strength, though  tolerated all activities this date and was able to transition to gravity-eliminated abduction and adduction with time and repetition. Patient still unable to perform appropriate gait mechanics with SPC, however, has been performing current mechanics for at least 10 years. Patient will continue to benefit from skilled PT.  OBJECTIVE IMPAIRMENTS: Abnormal gait, decreased activity tolerance, decreased balance, decreased coordination, decreased endurance, decreased mobility, difficulty walking, decreased ROM, decreased strength, impaired flexibility, improper body mechanics, postural dysfunction, and pain.   ACTIVITY LIMITATIONS: lifting, standing, squatting,  stairs, and transfers  PARTICIPATION LIMITATIONS: community activity and occupation  PERSONAL FACTORS: Fitness, Past/current experiences, Time since onset of injury/illness/exacerbation, and 3+ comorbidities: multiple sclerosis, heart murmur, sleep apnea are also affecting patient's functional outcome.   REHAB POTENTIAL: Good  CLINICAL DECISION MAKING: Evolving/moderate complexity  EVALUATION COMPLEXITY: Moderate   GOALS: Goals reviewed with patient? Yes  SHORT TERM GOALS: Target date: 01/11/2024 Patient will show compliance with initial HEP.  Baseline: Goal status: ongoing, 01/12/2024  2.  Patient will report pain levels no greater than 4/10 in order to show improved overall quality of life. Baseline:  Goal status: GOAL MET, 01/12/2024   LONG TERM GOALS: Target date: 02/15/2024  Patient will be independent with final HEP in order to maintain and progress upon functional gains made within PT.  Baseline:  Goal status: INITIAL  2.  Patient will report pain levels no greater than 2/10 in order to show improved overall quality of life. Baseline:  Goal status: INITIAL  3.  Patient will increase PSFS to at least 8 in order to show a significant improvement in subjective disability rating. Baseline:  Goal status: INITIAL  4.  Patient will improve bilat hip abduction MMT to at least 4/5 in order to improve biomechanics with functional mobility. Baseline:  Goal status: INITIAL  5.  Patient will improve bilat hip extension MMT to at least 4/5 in order to improve biomechanics with functional mobility. Baseline:  Goal status: INITIAL  6.  Patient will improve 5xSTS to at least 12s in order to decrease risk of falling.  Baseline:  Goal status: INITIAL   PLAN:  PT FREQUENCY: 1-2x/week  PT DURATION: 8 weeks  PLANNED INTERVENTIONS: 97164- PT Re-evaluation, 97750- Physical Performance Testing, 97110-Therapeutic exercises, 97530- Therapeutic activity, [id number]- Neuromuscular  re-education, 97535- Self Care, 02859- Manual therapy, [id number]- Gait training, [phone]- Canalith repositioning, [id number]- Electrical stimulation (unattended), [id number]- Electrical stimulation (manual), [id number]- Vasopneumatic device, [id number]- Ultrasound, [id number]- Traction (mechanical), [id number]- Ionotophoresis 4mg /ml Dexamethasone, 79439 (1-2 muscles), 20561 (3+ muscles)- Dry Needling, Patient/Family education, Balance training, Stair training, Taping, Joint mobilization, Joint manipulation, Spinal manipulation, Spinal mobilization, Scar mobilization, Vestibular training, DME instructions, Cryotherapy, and Moist heat  PLAN FOR NEXT SESSION:  review changes made to HEP, generalized LE strengthening, assessment of gait with and without SPC, gait training with cane    Susannah Daring, PT, DPT 01/14/24 3:47 PM   Date of referral: 11/30/2023 Referring provider: Lonell Sprang, DO Referring diagnosis? M25.551,G89.29 (ICD-10-CM) - Chronic right hip pain R29.898 (ICD-10-CM) - Weakness of right hip M67.951 (ICD-10-CM) - Tendinopathy of right gluteal region Treatment diagnosis? (if different than referring diagnosis) M25.551, M25.651, M62.81, R26.89, R26.81  What was this (referring dx) caused by? Fall and Ongoing Issue  Lysle of Condition: Chronic (continuous duration > 3 months)   Laterality: Rt  Current Functional Measure Score: Patient Specific Functional Scale 6  Objective measurements identify impairments when they are compared to normal values, the uninvolved extremity, and prior level of function.  [x]  Yes  []  No  Objective assessment of functional ability: Moderate functional limitations   Briefly describe symptoms: pain with palpation, weakness, unsteady on feet   How did symptoms start: patient slipped on ice several years ago and symptoms never dissipated  Average pain intensity:  Last 24 hours: 2/10  Past week: 5/10  How often does the pt experience symptoms? Frequently  How much have the symptoms  interfered with usual daily activities? Moderately  How has condition changed since care began at this facility? NA - initial visit  In general, how is the patients overall health? Fair   BACK PAIN (STarT Back Screening Tool) No

## 2024-01-14 ENCOUNTER — Encounter

## 2024-01-14 ENCOUNTER — Ambulatory Visit (INDEPENDENT_AMBULATORY_CARE_PROVIDER_SITE_OTHER)

## 2024-01-14 DIAGNOSIS — M6281 Muscle weakness (generalized): Secondary | ICD-10-CM

## 2024-01-14 DIAGNOSIS — R2681 Unsteadiness on feet: Secondary | ICD-10-CM

## 2024-01-14 DIAGNOSIS — R2689 Other abnormalities of gait and mobility: Secondary | ICD-10-CM

## 2024-01-14 DIAGNOSIS — M25551 Pain in right hip: Secondary | ICD-10-CM | POA: Diagnosis not present

## 2024-01-14 DIAGNOSIS — M25651 Stiffness of right hip, not elsewhere classified: Secondary | ICD-10-CM

## 2024-01-18 NOTE — Therapy (Signed)
 OUTPATIENT PHYSICAL THERAPY LOWER EXTREMITY TREATMENT   Patient Name: [name] [name] MRN: [id number] DOB:07/28/1965, 58 y.o., male Today's Date: 01/19/2024  END OF SESSION:  PT End of Session - 01/19/24 1155     Visit Number 6    Number of Visits 16    Date for Recertification  02/15/24    Authorization Type [employer] MEDICARE $20 COPAY    Authorization Time Period 12/21/2023-02/15/2024    Authorization - Number of Visits 16    Progress Note Due on Visit 10    PT Start Time 1155    PT Stop Time 1233    PT Time Calculation (min) 38 min    Activity Tolerance Patient tolerated treatment well    Behavior During Therapy [facility] for tasks assessed/performed             Past Medical History:  Diagnosis Date   Heart murmur    Multiple sclerosis    dx 2014   Neuromuscular disorder (HCC) 10/2012   high probability of MS   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    Other postprocedural status(V45.89)    inguinal herniorrhaphies bilateral    Sleep apnea    cpap   Testosterone  deficiency 11/04/11   Past Surgical History:  Procedure Laterality Date   HERNIA REPAIR Bilateral 1971   LAPAROSCOPIC CHOLECYSTECTOMY  2014   Patient Active Problem List   Diagnosis Date Noted   Right radial head fracture 12/19/2021   Right sided abdominal pain 02/04/2019   Former smoker 01/18/2019   Food allergy  10/24/2016   Other allergic rhinitis 10/24/2016   History of adenomatous polyp of colon 10/23/2014   Multiple sclerosis 11/03/2012   Hypogonadism male 10/06/2011    PCP: Norleen JAYSON Jobs, MD   REFERRING PROVIDER: Lonell Sprang, DO  REFERRING DIAG: [phone] (ICD-10-CM) - Chronic right hip pain R29.898 (ICD-10-CM) - Weakness of right hip M67.951 (ICD-10-CM) - Tendinopathy of right gluteal region  THERAPY DIAG:  Pain in right hip  Stiffness of right hip, not elsewhere classified  Muscle weakness (generalized)  Other abnormalities of gait and  mobility  Unsteadiness on feet  Rationale for Evaluation and Treatment: Rehabilitation  ONSET DATE: 2 years it was worse then finally went to Dr. Sprang 5 months ago  SUBJECTIVE:   SUBJECTIVE STATEMENT: Patient reports no pain this date, though having some stomach pain following bad food last night.  PERTINENT HISTORY: Patient experienced a fall 5 years ago where he had a fall on ice and landed on lateral Rt LE. The fall led to extensive bruising on entire lateral thigh as well as pain with no change over several months. Patient and PCP thought it was an effect from MS (diagnoses in 2014). Patient has received injection for bursitis from PCP within first year with no change in symptoms and is currently undergoing extracorporeal shockwave therapy through Dr. Sprang which is largely improving symptoms. Patient has had no other past surgeries or injuries to Rt LE/hip or surrounding areas.  PAIN:  Are you having pain? Yes: NPRS scale: 0/10   Pain location: Rt lateral hip, adductors/groin Pain description: dull Aggravating factors: deep tissue work  Relieving factors: shock therapy, mobility work   PRECAUTIONS: None  RED FLAGS: None   WEIGHT BEARING RESTRICTIONS: No  FALLS:  Has patient fallen in last 6 months? No  LIVING ENVIRONMENT: Lives with: dog Lives in: House/apartment Stairs: Yes: External: 2 steps; on right going up Has following equipment at home: Single point  cane  OCCUPATION: disability, financial trading  PLOF: Independent and uses single point cane   PATIENT GOALS: to be normal again  NEXT MD VISIT: this Friday and following Friday for shock therapy   OBJECTIVE:  Note: Objective measures were completed at Evaluation unless otherwise noted.  DIAGNOSTIC FINDINGS:  IMPRESSION: Osteopenia. No acute osseous abnormalities.  IMPRESSION: 1. Mild osteoarthritis of the right hip with anterosuperior labral tear. 2. Mild tendinosis of the bilateral gluteus  medius tendons. 3. Mild intramuscular edema within the right adductor magnus and brevis muscles at their origin on the right ischiopubic ramus, suggestive of a low-grade muscle strain.  PATIENT SURVEYS:  PSFS: THE PATIENT SPECIFIC FUNCTIONAL SCALE  Place score of 0-10 (0 = unable to perform activity and 10 = able to perform activity at the same level as before injury or problem)  Activity Date: 12/21/2023    Walking  7    2. Stairs  5    3.     4.      Total Score 6      Total Score = Sum of activity scores/number of activities  Minimally Detectable Change: 3 points (for single activity); 2 points (for average score)  [facility] (nd). The Patient Specific Functional Scale . Retrieved from [email or web address]   COGNITION: Overall cognitive status: Within functional limits for tasks assessed     SENSATION: Light touch: WFL  EDEMA:  Not assessed on eval  MUSCLE LENGTH: Not formally assessed, though hamstring tightness noted with SLR  POSTURE: rounded shoulders, forward head, and increased thoracic kyphosis  PALPATION: Not TTP in Rt glutes or lateral thigh   LOWER EXTREMITY ROM:  ROM Right Eval 12/21/2023 Left Eval 12/21/2023  Hip flexion 2+/5 3+/5  Hip extension 2+/5 3-/5  Hip abduction 3-/5 3-/5 with compensation into hip flexion  Hip adduction    Hip internal rotation    Hip external rotation    Knee flexion 4-/5 4/5  Knee extension 5/5 5/5  Ankle dorsiflexion 3+/5 4-/5  Ankle plantarflexion    Ankle inversion    Ankle eversion     (Blank rows = not tested)  LOWER EXTREMITY MMT:  MMT Right Eval 12/21/2023 Left Eval 12/21/2023  Hip flexion [facility] WFL  Hip extension    Hip abduction    Hip adduction    Hip internal rotation    Hip external rotation    Knee flexion    Knee extension    Ankle dorsiflexion    Ankle plantarflexion    Ankle inversion    Ankle eversion     (Blank rows = not  tested)  LOWER EXTREMITY SPECIAL TESTS:  Hip special tests: Belvie (FABER) test: negative and FADIR negative though patient notes muscle tightness in lateral glutes with FABER  FUNCTIONAL TESTS:  5 times sit to stand: 16.34s using bilat UEs and uncontrolled   GAIT: Distance walked: not formally assessed  Assistive device utilized: Single point cane Level of assistance: supervision  Comments: wide BOS, decreased knee flexion throughout bilaterally; will require further in depth assessment   [facility] PT Assessment - 01/08/24 0001       Functional Gait  Assessment   Gait Level Surface Walks 20 ft, slow speed, abnormal gait pattern, evidence for imbalance or deviates 10-15 in outside of the 12 in walkway width. Requires more than 7 sec to ambulate 20 ft.    Change in Gait Speed Makes only minor adjustments to walking speed, or accomplishes a change in speed with significant  gait deviations, deviates 10-15 in outside the 12 in walkway width, or changes speed but loses balance but is able to recover and continue walking.    Gait with Horizontal Head Turns Performs head turns with moderate changes in gait velocity, slows down, deviates 10-15 in outside 12 in walkway width but recovers, can continue to walk.    Gait with Vertical Head Turns Performs task with moderate change in gait velocity, slows down, deviates 10-15 in outside 12 in walkway width but recovers, can continue to walk.    Gait and Pivot Turn Pivot turns safely in greater than 3 sec and stops with no loss of balance, or pivot turns safely within 3 sec and stops with mild imbalance, requires small steps to catch balance.    Step Over Obstacle Is able to step over one shoe box (4.5 in total height) but must slow down and adjust steps to clear box safely. May require verbal cueing.    Gait with Narrow Base of Support Ambulates less than 4 steps heel to toe or cannot perform without assistance.    Gait with Eyes Closed Walks 20 ft, slow  speed, abnormal gait pattern, evidence for imbalance, deviates 10-15 in outside 12 in walkway width. Requires more than 9 sec to ambulate 20 ft.    Ambulating Backwards Walks 20 ft, slow speed, abnormal gait pattern, evidence for imbalance, deviates 10-15 in outside 12 in walkway width.    Steps Two feet to a stair, must use rail.    Total Score 10    FGA comment: CGA throughout, done without AD, though typically ambulates with [facility]                                                                                                                                        TREATMENT DATE:  01/19/2024 TherEx:  UBE with bilat LE level 3 for 8 minutes  PT discussed HEP with additions  Supine marches with 2# ankle weights with focus on TA activation and slow and controlled movements 3x20 Cues for appropriate form and speed required throughout  Attempted sidelying adduction, highly difficult ,discontinued  Supine abduction/adduction 3x8 each side  Discussed toe out vs toe straight  RDLs with 5# DB 1x15   01/14/2024 TherEx:  Recumbent bike level 3 for 8 minutes  Sidelying hip flexion with PT assist for appropriate positioning, verbal cues for full hip extension with appropriate carryover 2x8 each LE  Supine abduction/adduction 2x8 each side; PT assist for AAROM during first set for both LE, transitioned to AROM  Intermittent verbal cues to decrease speed of movement  Supine ball squeezes for adduction 2x10 with 3s squeeze Supine bent knee fallouts 2x10 each side; verbal cues for speed of movement with appropriate carryover   Gait Training:  PT educating patient and demonstrating 2-point technique with SPC on Lt side in order to increase BOS and support appropriate gait  mechanics with arm swing ; patient attempted, though unable to perform prior to end of session   01/12/2024 TherEx:  Recumbent bike level 3 for 8 minutes  Bilat leg press 1x3 with large genu recurvatem moment on Rt LE with knee  extension  Seated LAQ with focus on slow and controlled movement 2x12 each LE with 3s eccentric  Intermittent verbal cues for speed of movement  Sit<>stands with red TB around knees for abduction cue 1x4; changed to staggered stance with Lt LE fwd to increase weight bearing through Rt LE 1x10  Verbal cues required for knee extension upon standing with minimal carryover from patient  Tap down squats with red TB around knees with staggered stance keeping Lt LE fwd to increase challenge in Rt LE, 1x10 with deficits in performance worsening with time and repetitions Seated marches 2x6 with decreased ROM/strength on Rt; compensatory movement with Lt lateral trunk lean and momentum Discussed adding sidelying hip flexion to HEP in order to improve strength  PT discussed returning to bridges with yellow TB in order to increase challenge during HEP   01/05/2024 TherEx:  Nustep level 4 for 8 minutes with bilat LE  PT discussed HEP, addition of bands, upcoming progressions with HEP, and importance of performing physical activity and exercise   Physical Performance:  FGA with results noted above  Discussed results with patient   12/31/2023 TherEx: Nustep level 4 for 8 minutes, 4 with bilat UE/LE and 4 with bilat LE only  Glute bridges 1x3 with PT placement of feet on table, patient performing with adduction and increased speed throughout reps; verbal cues given for both with patient having moderate carryover for speed of movement, but not adduction  PT placed yellow TB around knees for tactile cue to decrease adduction moment which improved performance, 2x8 ; intermittent reminders given for speed of movement  Yellow TB given to patient for HEP  Discussed adding calf raises and toe raises to HEP, patient performing 1x10 of each with difficulty on Rt LE secondary to MS diagnosis   Neuro Re-Ed:  General static balance assessment performed in parallel bars with CGA  Normal stance for 20s with no sway  noted  Narrow stance for 20s with no sway noted  Tandem stance with mod-large increase in sway for 20s each  Unable to perform SLS safely   Narrow stance with eyes closed with CGA 1x20s with moderate increase in sway   Semi-tandem stance 2x20s each side with CGA  PT discussed 3 balance systems, how they interact, and how they may be impacted by MS diagnosis     PATIENT EDUCATION:  Education details: HEP, POC, osteopenia Person educated: Patient Education method: Explanation, Demonstration, Tactile cues, Verbal cues, and Handouts Education comprehension: verbalized understanding, returned demonstration, verbal cues required, tactile cues required, and needs further education  HOME EXERCISE PROGRAM: Access Code: [email or web address] URL: [email or web address] Date: 01/19/2024 Prepared by: Susannah Daring  Exercises - Standing Hip Abduction with Counter Support  - 1 x daily - 7 x weekly - 2 sets - 10 reps - Standing Hip Extension with Counter Support  - 1 x daily - 7 x weekly - 2 sets - 10 reps - Mini Squat with Counter Support  - 1 x daily - 7 x weekly - 2 sets - 10 reps - Seated Hamstring Stretch  - 1 x daily - 7 x weekly - 2 sets - 30s hold - Supine Bridge with Resistance Band  - 1 x daily - 7 x weekly -  2 sets - 10 reps - 2s hold - Bent Knee Fallouts  - 1 x daily - 7 x weekly - 2 sets - 10 reps - Sidelying Bent Knee Hip Flexion  - 1 x daily - 7 x weekly - 2 sets - 10 reps - Heel Raises with Counter Support  - 1 x daily - 7 x weekly - 2 sets - 10 reps - Heel Toe Raises with Counter Support  - 1 x daily - 7 x weekly - 2 sets - 10 reps  ASSESSMENT:  CLINICAL IMPRESSION: Patient arrived to session noting no pain. Patient continues to require verbal cues for speed of all activities, though tolerated all activities with appropriate muscle fatigue. Patient will continue to benefit from skilled PT.   OBJECTIVE IMPAIRMENTS: Abnormal gait, decreased activity tolerance, decreased balance,  decreased coordination, decreased endurance, decreased mobility, difficulty walking, decreased ROM, decreased strength, impaired flexibility, improper body mechanics, postural dysfunction, and pain.   ACTIVITY LIMITATIONS: lifting, standing, squatting, stairs, and transfers  PARTICIPATION LIMITATIONS: community activity and occupation  PERSONAL FACTORS: Fitness, Past/current experiences, Time since onset of injury/illness/exacerbation, and 3+ comorbidities: multiple sclerosis, heart murmur, sleep apnea are also affecting patient's functional outcome.   REHAB POTENTIAL: Good  CLINICAL DECISION MAKING: Evolving/moderate complexity  EVALUATION COMPLEXITY: Moderate   GOALS: Goals reviewed with patient? Yes  SHORT TERM GOALS: Target date: 01/11/2024 Patient will show compliance with initial HEP.  Baseline: Goal status: ongoing, 01/12/2024  2.  Patient will report pain levels no greater than 4/10 in order to show improved overall quality of life. Baseline:  Goal status: GOAL MET, 01/12/2024   LONG TERM GOALS: Target date: 02/15/2024  Patient will be independent with final HEP in order to maintain and progress upon functional gains made within PT.  Baseline:  Goal status: INITIAL  2.  Patient will report pain levels no greater than 2/10 in order to show improved overall quality of life. Baseline:  Goal status: INITIAL  3.  Patient will increase PSFS to at least 8 in order to show a significant improvement in subjective disability rating. Baseline:  Goal status: INITIAL  4.  Patient will improve bilat hip abduction MMT to at least 4/5 in order to improve biomechanics with functional mobility. Baseline:  Goal status: INITIAL  5.  Patient will improve bilat hip extension MMT to at least 4/5 in order to improve biomechanics with functional mobility. Baseline:  Goal status: INITIAL  6.  Patient will improve 5xSTS to at least 12s in order to decrease risk of falling.  Baseline:   Goal status: INITIAL   PLAN:  PT FREQUENCY: 1-2x/week  PT DURATION: 8 weeks  PLANNED INTERVENTIONS: 97164- PT Re-evaluation, 97750- Physical Performance Testing, 97110-Therapeutic exercises, 97530- Therapeutic activity, [id number]- Neuromuscular re-education, 97535- Self Care, [id number]- Manual therapy, [id number]- Gait training, [phone]- Canalith repositioning, [id number]- Electrical stimulation (unattended), [phone]- Electrical stimulation (manual), [id number]- Vasopneumatic device, [id number]- Ultrasound, [id number]- Traction (mechanical), [id number]- Ionotophoresis 4mg /ml Dexamethasone, 79439 (1-2 muscles), 20561 (3+ muscles)- Dry Needling, Patient/Family education, Balance training, Stair training, Taping, Joint mobilization, Joint manipulation, Spinal manipulation, Spinal mobilization, Scar mobilization, Vestibular training, DME instructions, Cryotherapy, and Moist heat  PLAN FOR NEXT SESSION:   review changes made to HEP, generalized LE strengthening, assessment of gait with and without SPC, gait training with cane    Susannah Daring, PT, DPT 01/19/24 12:41 PM   Date of referral: 11/30/2023 Referring provider: Lonell Sprang, DO Referring diagnosis? M25.551,G89.29 (ICD-10-CM) - Chronic right hip pain R29.898 (ICD-10-CM) - Weakness of right  hip M67.951 (ICD-10-CM) - Tendinopathy of right gluteal region Treatment diagnosis? (if different than referring diagnosis) M25.551, M25.651, M62.81, R26.89, R26.81  What was this (referring dx) caused by? Fall and Ongoing Issue  Lysle of Condition: Chronic (continuous duration > 3 months)   Laterality: Rt  Current Functional Measure Score: Patient Specific Functional Scale 6  Objective measurements identify impairments when they are compared to normal values, the uninvolved extremity, and prior level of function.  [x]  Yes  []  No  Objective assessment of functional ability: Moderate functional limitations   Briefly describe symptoms: pain with palpation, weakness, unsteady on feet    How did symptoms start: patient slipped on ice several years ago and symptoms never dissipated  Average pain intensity:  Last 24 hours: 2/10  Past week: 5/10  How often does the pt experience symptoms? Frequently  How much have the symptoms interfered with usual daily activities? Moderately  How has condition changed since care began at this facility? NA - initial visit  In general, how is the patients overall health? Fair   BACK PAIN (STarT Back Screening Tool) No

## 2024-01-19 ENCOUNTER — Ambulatory Visit

## 2024-01-19 ENCOUNTER — Encounter

## 2024-01-19 DIAGNOSIS — M25551 Pain in right hip: Secondary | ICD-10-CM

## 2024-01-19 DIAGNOSIS — M6281 Muscle weakness (generalized): Secondary | ICD-10-CM | POA: Diagnosis not present

## 2024-01-19 DIAGNOSIS — R2689 Other abnormalities of gait and mobility: Secondary | ICD-10-CM

## 2024-01-19 DIAGNOSIS — M25651 Stiffness of right hip, not elsewhere classified: Secondary | ICD-10-CM | POA: Diagnosis not present

## 2024-01-19 DIAGNOSIS — R2681 Unsteadiness on feet: Secondary | ICD-10-CM | POA: Diagnosis not present

## 2024-01-20 ENCOUNTER — Other Ambulatory Visit: Payer: Self-pay | Admitting: Medical Genetics

## 2024-01-20 NOTE — Therapy (Incomplete)
 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    Other postprocedural status(V45.89)    inguinal herniorrhaphies bilateral    Sleep apnea    cpap   Testosterone  deficiency 11/04/11   Past Surgical History:  Procedure Laterality Date   HERNIA REPAIR Bilateral 1971   LAPAROSCOPIC CHOLECYSTECTOMY  2014   Patient Active Problem List   Diagnosis Date Noted   Right radial head fracture 12/19/2021   Right sided abdominal pain 02/04/2019   Former smoker 01/18/2019   Food allergy  10/24/2016   Other allergic rhinitis 10/24/2016   History of adenomatous polyp of colon 10/23/2014   Multiple sclerosis 11/03/2012   Hypogonadism male 10/06/2011    PCP: Norleen JAYSON Jobs, MD   REFERRING PROVIDER: Lonell Sprang, DO  REFERRING DIAG: [phone] (ICD-10-CM) - Chronic right hip pain R29.898 (ICD-10-CM) - Weakness of right hip M67.951 (ICD-10-CM) - Tendinopathy of right gluteal region  THERAPY DIAG:  No diagnosis found.  Rationale for Evaluation and Treatment: Rehabilitation  ONSET DATE: 2 years it was worse then finally went to Dr. Sprang 5 months ago  SUBJECTIVE:   SUBJECTIVE STATEMENT: *** Patient reports no pain this date, though having some stomach pain following bad food last night.  PERTINENT HISTORY: Patient experienced a fall 5 years ago where he had a fall on ice and landed on lateral Rt LE. The fall led to extensive bruising on entire lateral thigh as well as pain with no change over several months. Patient and PCP thought it was an effect from MS (diagnoses in 2014). Patient has received injection for bursitis  from PCP within first year with no change in symptoms and is currently undergoing extracorporeal shockwave therapy through Dr. Sprang which is largely improving symptoms. Patient has had no other past surgeries or injuries to Rt LE/hip or surrounding areas.  PAIN:  Are you having pain? Yes: NPRS scale: 0/10   Pain location: Rt lateral hip, adductors/groin Pain description: dull Aggravating factors: deep tissue work  Relieving factors: shock therapy, mobility work   PRECAUTIONS: None  RED FLAGS: None   WEIGHT BEARING RESTRICTIONS: No  FALLS:  Has patient fallen in last 6 months? No  LIVING ENVIRONMENT: Lives with: dog Lives in: House/apartment Stairs: Yes: External: 2 steps; on right going up Has following equipment at home: Single point cane  OCCUPATION: disability, financial trading  PLOF: Independent and uses single point cane   PATIENT GOALS: to be normal again  NEXT MD VISIT: this Friday and following Friday for shock therapy   OBJECTIVE:  Note: Objective measures were completed at Evaluation unless otherwise noted.  DIAGNOSTIC FINDINGS:  IMPRESSION: Osteopenia. No acute osseous abnormalities.  IMPRESSION: 1. Mild osteoarthritis of the right hip with anterosuperior labral tear. 2. Mild tendinosis of the bilateral gluteus medius tendons. 3. Mild intramuscular edema within the right adductor magnus and brevis muscles at their origin on the right ischiopubic ramus, suggestive of a low-grade muscle strain.  PATIENT SURVEYS:  PSFS: THE PATIENT SPECIFIC FUNCTIONAL SCALE  Place score of 0-10 (0 = unable to perform activity and 10 = able to perform activity at  the same level as before injury or problem)  Activity Date: 12/21/2023    Walking  7    2. Stairs  5    3.     4.      Total Score 6      Total Score = Sum of activity scores/number of activities  Minimally Detectable Change: 3 points (for single activity); 2 points (for average score)  [employer] [employer]  [facility] (nd). The Patient Specific Functional Scale . Retrieved from [email or web address]   COGNITION: Overall cognitive status: Within functional limits for tasks assessed     SENSATION: Light touch: WFL  EDEMA:  Not assessed on eval  MUSCLE LENGTH: Not formally assessed, though hamstring tightness noted with SLR  POSTURE: rounded shoulders, forward head, and increased thoracic kyphosis  PALPATION: Not TTP in Rt glutes or lateral thigh   LOWER EXTREMITY ROM:  ROM Right Eval 12/21/2023 Left Eval 12/21/2023  Hip flexion 2+/5 3+/5  Hip extension 2+/5 3-/5  Hip abduction 3-/5 3-/5 with compensation into hip flexion  Hip adduction    Hip internal rotation    Hip external rotation    Knee flexion 4-/5 4/5  Knee extension 5/5 5/5  Ankle dorsiflexion 3+/5 4-/5  Ankle plantarflexion    Ankle inversion    Ankle eversion     (Blank rows = not tested)  LOWER EXTREMITY MMT:  MMT Right Eval 12/21/2023 Left Eval 12/21/2023  Hip flexion [facility] WFL  Hip extension    Hip abduction    Hip adduction    Hip internal rotation    Hip external rotation    Knee flexion    Knee extension    Ankle dorsiflexion    Ankle plantarflexion    Ankle inversion    Ankle eversion     (Blank rows = not tested)  LOWER EXTREMITY SPECIAL TESTS:  Hip special tests: Belvie (FABER) test: negative and FADIR negative though patient notes muscle tightness in lateral glutes with FABER  FUNCTIONAL TESTS:  5 times sit to stand: 16.34s using bilat UEs and uncontrolled   GAIT: Distance walked: not formally assessed  Assistive device utilized: Single point cane Level of assistance: supervision  Comments: wide BOS, decreased knee flexion throughout bilaterally; will require further in depth assessment   [facility] PT Assessment - 01/08/24 0001       Functional Gait  Assessment   Gait Level Surface Walks 20 ft, slow speed, abnormal gait pattern, evidence  for imbalance or deviates 10-15 in outside of the 12 in walkway width. Requires more than 7 sec to ambulate 20 ft.    Change in Gait Speed Makes only minor adjustments to walking speed, or accomplishes a change in speed with significant gait deviations, deviates 10-15 in outside the 12 in walkway width, or changes speed but loses balance but is able to recover and continue walking.    Gait with Horizontal Head Turns Performs head turns with moderate changes in gait velocity, slows down, deviates 10-15 in outside 12 in walkway width but recovers, can continue to walk.    Gait with Vertical Head Turns Performs task with moderate change in gait velocity, slows down, deviates 10-15 in outside 12 in walkway width but recovers, can continue to walk.    Gait and Pivot Turn Pivot turns safely in greater than 3 sec and stops with no loss of balance, or pivot turns safely within 3 sec and stops with mild imbalance, requires small steps to catch balance.    Step Over  Obstacle Is able to step over one shoe box (4.5 in total height) but must slow down and adjust steps to clear box safely. May require verbal cueing.    Gait with Narrow Base of Support Ambulates less than 4 steps heel to toe or cannot perform without assistance.    Gait with Eyes Closed Walks 20 ft, slow speed, abnormal gait pattern, evidence for imbalance, deviates 10-15 in outside 12 in walkway width. Requires more than 9 sec to ambulate 20 ft.    Ambulating Backwards Walks 20 ft, slow speed, abnormal gait pattern, evidence for imbalance, deviates 10-15 in outside 12 in walkway width.    Steps Two feet to a stair, must use rail.    Total Score 10    FGA comment: CGA throughout, done without AD, though typically ambulates with [facility]                                                                                                                                        TREATMENT DATE:  01/21/2024 ***  01/19/2024 TherEx:  UBE with bilat LE level 3  for 8 minutes  PT discussed HEP with additions  Supine marches with 2# ankle weights with focus on TA activation and slow and controlled movements 3x20 Cues for appropriate form and speed required throughout  Attempted sidelying adduction, highly difficult ,discontinued  Supine abduction/adduction 3x8 each side  Discussed toe out vs toe straight  RDLs with 5# DB 1x15   01/14/2024 TherEx:  Recumbent bike level 3 for 8 minutes  Sidelying hip flexion with PT assist for appropriate positioning, verbal cues for full hip extension with appropriate carryover 2x8 each LE  Supine abduction/adduction 2x8 each side; PT assist for AAROM during first set for both LE, transitioned to AROM  Intermittent verbal cues to decrease speed of movement  Supine ball squeezes for adduction 2x10 with 3s squeeze Supine bent knee fallouts 2x10 each side; verbal cues for speed of movement with appropriate carryover   Gait Training:  PT educating patient and demonstrating 2-point technique with SPC on Lt side in order to increase BOS and support appropriate gait mechanics with arm swing ; patient attempted, though unable to perform prior to end of session   01/12/2024 TherEx:  Recumbent bike level 3 for 8 minutes  Bilat leg press 1x3 with large genu recurvatem moment on Rt LE with knee extension  Seated LAQ with focus on slow and controlled movement 2x12 each LE with 3s eccentric  Intermittent verbal cues for speed of movement  Sit<>stands with red TB around knees for abduction cue 1x4; changed to staggered stance with Lt LE fwd to increase weight bearing through Rt LE 1x10  Verbal cues required for knee extension upon standing with minimal carryover from patient  Tap down squats with red TB around knees with staggered stance keeping Lt LE fwd to increase challenge in Rt LE, 1x10  with deficits in performance worsening with time and repetitions Seated marches 2x6 with decreased ROM/strength on Rt; compensatory movement  with Lt lateral trunk lean and momentum Discussed adding sidelying hip flexion to HEP in order to improve strength  PT discussed returning to bridges with yellow TB in order to increase challenge during HEP   01/05/2024 TherEx:  Nustep level 4 for 8 minutes with bilat LE  PT discussed HEP, addition of bands, upcoming progressions with HEP, and importance of performing physical activity and exercise   Physical Performance:  FGA with results noted above  Discussed results with patient    PATIENT EDUCATION:  Education details: HEP, POC, osteopenia Person educated: Patient Education method: Explanation, Demonstration, Tactile cues, Verbal cues, and Handouts Education comprehension: verbalized understanding, returned demonstration, verbal cues required, tactile cues required, and needs further education  HOME EXERCISE PROGRAM: Access Code: [email or web address] URL: [email or web address] Date: 01/19/2024 Prepared by: Susannah Daring  Exercises - Standing Hip Abduction with Counter Support  - 1 x daily - 7 x weekly - 2 sets - 10 reps - Standing Hip Extension with Counter Support  - 1 x daily - 7 x weekly - 2 sets - 10 reps - Mini Squat with Counter Support  - 1 x daily - 7 x weekly - 2 sets - 10 reps - Seated Hamstring Stretch  - 1 x daily - 7 x weekly - 2 sets - 30s hold - Supine Bridge with Resistance Band  - 1 x daily - 7 x weekly - 2 sets - 10 reps - 2s hold - Bent Knee Fallouts  - 1 x daily - 7 x weekly - 2 sets - 10 reps - Sidelying Bent Knee Hip Flexion  - 1 x daily - 7 x weekly - 2 sets - 10 reps - Heel Raises with Counter Support  - 1 x daily - 7 x weekly - 2 sets - 10 reps - Heel Toe Raises with Counter Support  - 1 x daily - 7 x weekly - 2 sets - 10 reps  ASSESSMENT:  CLINICAL IMPRESSION: *** Patient arrived to session noting no pain. Patient continues to require verbal cues for speed of all activities, though tolerated all activities with appropriate muscle fatigue. Patient  will continue to benefit from skilled PT.   OBJECTIVE IMPAIRMENTS: Abnormal gait, decreased activity tolerance, decreased balance, decreased coordination, decreased endurance, decreased mobility, difficulty walking, decreased ROM, decreased strength, impaired flexibility, improper body mechanics, postural dysfunction, and pain.   ACTIVITY LIMITATIONS: lifting, standing, squatting, stairs, and transfers  PARTICIPATION LIMITATIONS: community activity and occupation  PERSONAL FACTORS: Fitness, Past/current experiences, Time since onset of injury/illness/exacerbation, and 3+ comorbidities: multiple sclerosis, heart murmur, sleep apnea are also affecting patient's functional outcome.   REHAB POTENTIAL: Good  CLINICAL DECISION MAKING: Evolving/moderate complexity  EVALUATION COMPLEXITY: Moderate   GOALS: Goals reviewed with patient? Yes  SHORT TERM GOALS: Target date: 01/11/2024 Patient will show compliance with initial HEP.  Baseline: Goal status: ongoing, 01/12/2024  2.  Patient will report pain levels no greater than 4/10 in order to show improved overall quality of life. Baseline:  Goal status: GOAL MET, 01/12/2024   LONG TERM GOALS: Target date: 02/15/2024  Patient will be independent with final HEP in order to maintain and progress upon functional gains made within PT.  Baseline:  Goal status: INITIAL  2.  Patient will report pain levels no greater than 2/10 in order to show improved overall quality of life. Baseline:  Goal status: INITIAL  3.  Patient will increase PSFS to at least 8 in order to show a significant improvement in subjective disability rating. Baseline:  Goal status: INITIAL  4.  Patient will improve bilat hip abduction MMT to at least 4/5 in order to improve biomechanics with functional mobility. Baseline:  Goal status: INITIAL  5.  Patient will improve bilat hip extension MMT to at least 4/5 in order to improve biomechanics with functional  mobility. Baseline:  Goal status: INITIAL  6.  Patient will improve 5xSTS to at least 12s in order to decrease risk of falling.  Baseline:  Goal status: INITIAL   PLAN:  PT FREQUENCY: 1-2x/week  PT DURATION: 8 weeks  PLANNED INTERVENTIONS: 97164- PT Re-evaluation, 97750- Physical Performance Testing, 97110-Therapeutic exercises, 97530- Therapeutic activity, [id number]- Neuromuscular re-education, 97535- Self Care, [id number]- Manual therapy, [id number]- Gait training, [phone]- Canalith repositioning, [id number]- Electrical stimulation (unattended), [phone]- Electrical stimulation (manual), [id number]- Vasopneumatic device, [id number]- Ultrasound, [id number]- Traction (mechanical), [id number]- Ionotophoresis 4mg /ml Dexamethasone, 79439 (1-2 muscles), 20561 (3+ muscles)- Dry Needling, Patient/Family education, Balance training, Stair training, Taping, Joint mobilization, Joint manipulation, Spinal manipulation, Spinal mobilization, Scar mobilization, Vestibular training, DME instructions, Cryotherapy, and Moist heat  PLAN FOR NEXT SESSION:  *** review changes made to HEP, generalized LE strengthening, assessment of gait with and without SPC, gait training with cane    Susannah Daring, PT, DPT 01/20/24 12:57 PM   Date of referral: 11/30/2023 Referring provider: Lonell Sprang, DO Referring diagnosis? M25.551,G89.29 (ICD-10-CM) - Chronic right hip pain R29.898 (ICD-10-CM) - Weakness of right hip M67.951 (ICD-10-CM) - Tendinopathy of right gluteal region Treatment diagnosis? (if different than referring diagnosis) M25.551, M25.651, M62.81, R26.89, R26.81  What was this (referring dx) caused by? Fall and Ongoing Issue  Lysle of Condition: Chronic (continuous duration > 3 months)   Laterality: Rt  Current Functional Measure Score: Patient Specific Functional Scale 6  Objective measurements identify impairments when they are compared to normal values, the uninvolved extremity, and prior level of function.  [x]  Yes  []  No  Objective  assessment of functional ability: Moderate functional limitations   Briefly describe symptoms: pain with palpation, weakness, unsteady on feet   How did symptoms start: patient slipped on ice several years ago and symptoms never dissipated  Average pain intensity:  Last 24 hours: 2/10  Past week: 5/10  How often does the pt experience symptoms? Frequently  How much have the symptoms interfered with usual daily activities? Moderately  How has condition changed since care began at this facility? NA - initial visit  In general, how is the patients overall health? Fair   BACK PAIN (STarT Back Screening Tool) No

## 2024-01-21 ENCOUNTER — Encounter

## 2024-01-21 ENCOUNTER — Ambulatory Visit

## 2024-01-21 DIAGNOSIS — R2689 Other abnormalities of gait and mobility: Secondary | ICD-10-CM

## 2024-01-21 DIAGNOSIS — R2681 Unsteadiness on feet: Secondary | ICD-10-CM | POA: Diagnosis not present

## 2024-01-21 DIAGNOSIS — M25651 Stiffness of right hip, not elsewhere classified: Secondary | ICD-10-CM

## 2024-01-21 DIAGNOSIS — M25551 Pain in right hip: Secondary | ICD-10-CM | POA: Diagnosis not present

## 2024-01-21 DIAGNOSIS — M6281 Muscle weakness (generalized): Secondary | ICD-10-CM | POA: Diagnosis not present

## 2024-01-21 NOTE — Therapy (Signed)
 OUTPATIENT PHYSICAL THERAPY LOWER EXTREMITY TREATMENT   Patient Name: [name] [name] MRN: [id number] DOB:11-11-1965, 58 y.o., male Today's Date: 01/21/2024  END OF SESSION:  PT End of Session - 01/21/24 1236     Visit Number 7    Number of Visits 16    Date for Recertification  02/15/24    Authorization Type [employer] MEDICARE $20 COPAY    Authorization Time Period 12/21/2023-02/15/2024    PT Start Time 1145    PT Stop Time 1225    PT Time Calculation (min) 40 min    Activity Tolerance Patient tolerated treatment well    Behavior During Therapy [facility] for tasks assessed/performed              Past Medical History:  Diagnosis Date   Heart murmur    Multiple sclerosis    dx 2014   Neuromuscular disorder (HCC) 10/2012   high probability of MS   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    Other postprocedural status(V45.89)    inguinal herniorrhaphies bilateral    Sleep apnea    cpap   Testosterone  deficiency 11/04/11   Past Surgical History:  Procedure Laterality Date   HERNIA REPAIR Bilateral 1971   LAPAROSCOPIC CHOLECYSTECTOMY  2014   Patient Active Problem List   Diagnosis Date Noted   Right radial head fracture 12/19/2021   Right sided abdominal pain 02/04/2019   Former smoker 01/18/2019   Food allergy  10/24/2016   Other allergic rhinitis 10/24/2016   History of adenomatous polyp of colon 10/23/2014   Multiple sclerosis 11/03/2012   Hypogonadism male 10/06/2011    PCP: Norleen JAYSON Jobs, MD   REFERRING PROVIDER: Lonell Sprang, DO  REFERRING DIAG: [phone] (ICD-10-CM) - Chronic right hip pain R29.898 (ICD-10-CM) - Weakness of right hip M67.951 (ICD-10-CM) - Tendinopathy of right gluteal region  THERAPY DIAG:  Pain in right hip  Stiffness of right hip, not elsewhere classified  Muscle weakness (generalized)  Other abnormalities of gait and mobility  Unsteadiness on feet  Rationale for Evaluation and Treatment:  Rehabilitation  ONSET DATE: 2 years it was worse then finally went to Dr. Sprang 5 months ago  SUBJECTIVE:   SUBJECTIVE STATEMENT: Patient states no pain continues. PERTINENT HISTORY: Patient experienced a fall 5 years ago where he had a fall on ice and landed on lateral Rt LE. The fall led to extensive bruising on entire lateral thigh as well as pain with no change over several months. Patient and PCP thought it was an effect from MS (diagnoses in 2014). Patient has received injection for bursitis from PCP within first year with no change in symptoms and is currently undergoing extracorporeal shockwave therapy through Dr. Sprang which is largely improving symptoms. Patient has had no other past surgeries or injuries to Rt LE/hip or surrounding areas.  PAIN:  Are you having pain? Yes: NPRS scale: 0/10   Pain location: Rt lateral hip, adductors/groin Pain description: dull Aggravating factors: deep tissue work  Relieving factors: shock therapy, mobility work   PRECAUTIONS: None  RED FLAGS: None   WEIGHT BEARING RESTRICTIONS: No  FALLS:  Has patient fallen in last 6 months? No  LIVING ENVIRONMENT: Lives with: dog Lives in: House/apartment Stairs: Yes: External: 2 steps; on right going up Has following equipment at home: Single point cane  OCCUPATION: disability, financial trading  PLOF: Independent and uses single point cane   PATIENT GOALS: to be normal again  NEXT MD VISIT: this Friday and  following Friday for shock therapy   OBJECTIVE:  Note: Objective measures were completed at Evaluation unless otherwise noted.  DIAGNOSTIC FINDINGS:  IMPRESSION: Osteopenia. No acute osseous abnormalities.  IMPRESSION: 1. Mild osteoarthritis of the right hip with anterosuperior labral tear. 2. Mild tendinosis of the bilateral gluteus medius tendons. 3. Mild intramuscular edema within the right adductor magnus and brevis muscles at their origin on the right ischiopubic  ramus, suggestive of a low-grade muscle strain.  PATIENT SURVEYS:  PSFS: THE PATIENT SPECIFIC FUNCTIONAL SCALE  Place score of 0-10 (0 = unable to perform activity and 10 = able to perform activity at the same level as before injury or problem)  Activity Date: 12/21/2023    Walking  7    2. Stairs  5    3.     4.      Total Score 6      Total Score = Sum of activity scores/number of activities  Minimally Detectable Change: 3 points (for single activity); 2 points (for average score)  [facility] (nd). The Patient Specific Functional Scale . Retrieved from [email or web address]   COGNITION: Overall cognitive status: Within functional limits for tasks assessed     SENSATION: Light touch: WFL  EDEMA:  Not assessed on eval  MUSCLE LENGTH: Not formally assessed, though hamstring tightness noted with SLR  POSTURE: rounded shoulders, forward head, and increased thoracic kyphosis  PALPATION: Not TTP in Rt glutes or lateral thigh   LOWER EXTREMITY ROM:  ROM Right Eval 12/21/2023 Left Eval 12/21/2023  Hip flexion 2+/5 3+/5  Hip extension 2+/5 3-/5  Hip abduction 3-/5 3-/5 with compensation into hip flexion  Hip adduction    Hip internal rotation    Hip external rotation    Knee flexion 4-/5 4/5  Knee extension 5/5 5/5  Ankle dorsiflexion 3+/5 4-/5  Ankle plantarflexion    Ankle inversion    Ankle eversion     (Blank rows = not tested)  LOWER EXTREMITY MMT:  MMT Right Eval 12/21/2023 Left Eval 12/21/2023  Hip flexion [facility] WFL  Hip extension    Hip abduction    Hip adduction    Hip internal rotation    Hip external rotation    Knee flexion    Knee extension    Ankle dorsiflexion    Ankle plantarflexion    Ankle inversion    Ankle eversion     (Blank rows = not tested)  LOWER EXTREMITY SPECIAL TESTS:  Hip special tests: Belvie (FABER) test: negative and FADIR negative though patient notes muscle  tightness in lateral glutes with FABER  FUNCTIONAL TESTS:  5 times sit to stand: 16.34s using bilat UEs and uncontrolled   GAIT: Distance walked: not formally assessed  Assistive device utilized: Single point cane Level of assistance: supervision  Comments: wide BOS, decreased knee flexion throughout bilaterally; will require further in depth assessment   [facility] PT Assessment - 01/08/24 0001       Functional Gait  Assessment   Gait Level Surface Walks 20 ft, slow speed, abnormal gait pattern, evidence for imbalance or deviates 10-15 in outside of the 12 in walkway width. Requires more than 7 sec to ambulate 20 ft.    Change in Gait Speed Makes only minor adjustments to walking speed, or accomplishes a change in speed with significant gait deviations, deviates 10-15 in outside the 12 in walkway width, or changes speed but loses balance but is able to recover and continue walking.    Gait  with Horizontal Head Turns Performs head turns with moderate changes in gait velocity, slows down, deviates 10-15 in outside 12 in walkway width but recovers, can continue to walk.    Gait with Vertical Head Turns Performs task with moderate change in gait velocity, slows down, deviates 10-15 in outside 12 in walkway width but recovers, can continue to walk.    Gait and Pivot Turn Pivot turns safely in greater than 3 sec and stops with no loss of balance, or pivot turns safely within 3 sec and stops with mild imbalance, requires small steps to catch balance.    Step Over Obstacle Is able to step over one shoe box (4.5 in total height) but must slow down and adjust steps to clear box safely. May require verbal cueing.    Gait with Narrow Base of Support Ambulates less than 4 steps heel to toe or cannot perform without assistance.    Gait with Eyes Closed Walks 20 ft, slow speed, abnormal gait pattern, evidence for imbalance, deviates 10-15 in outside 12 in walkway width. Requires more than 9 sec to ambulate 20 ft.     Ambulating Backwards Walks 20 ft, slow speed, abnormal gait pattern, evidence for imbalance, deviates 10-15 in outside 12 in walkway width.    Steps Two feet to a stair, must use rail.    Total Score 10    FGA comment: CGA throughout, done without AD, though typically ambulates with [facility]                                                                                                                                        TREATMENT DATE:  01/21/24 TherEx:  Recumbent bike level 3 for 8 minutes  Sidelying hip flexion with PT assist for appropriate positioning, verbal cues for full hip extension with appropriate carryover 2x8 each LE  Supine ball squeezes for adduction 2x10 with 3s squeeze Supine bent knee fallouts 2x10 each side; verbal cues for speed of movement with appropriate carryover  Clamshells 2x10 each side Supine marching 3x10 with TA activation and VC and touching goal Sit to stand with TA activation and 2# ball     01/19/2024 TherEx:  UBE with bilat LE level 3 for 8 minutes  PT discussed HEP with additions  Supine marches with 2# ankle weights with focus on TA activation and slow and controlled movements 3x20 Cues for appropriate form and speed required throughout  Attempted sidelying adduction, highly difficult ,discontinued  Supine abduction/adduction 3x8 each side  Discussed toe out vs toe straight  RDLs with 5# DB 1x15   01/14/2024 TherEx:  Recumbent bike level 3 for 8 minutes  Sidelying hip flexion with PT assist for appropriate positioning, verbal cues for full hip extension with appropriate carryover 2x8 each LE  Supine abduction/adduction 2x8 each side; PT assist for AAROM during first set for both LE, transitioned to AROM  Intermittent verbal  cues to decrease speed of movement  Supine ball squeezes for adduction 2x10 with 3s squeeze Supine bent knee fallouts 2x10 each side; verbal cues for speed of movement with appropriate carryover   Gait Training:  PT  educating patient and demonstrating 2-point technique with SPC on Lt side in order to increase BOS and support appropriate gait mechanics with arm swing ; patient attempted, though unable to perform prior to end of session   01/12/2024 TherEx:  Recumbent bike level 3 for 8 minutes  Bilat leg press 1x3 with large genu recurvatem moment on Rt LE with knee extension  Seated LAQ with focus on slow and controlled movement 2x12 each LE with 3s eccentric  Intermittent verbal cues for speed of movement  Sit<>stands with red TB around knees for abduction cue 1x4; changed to staggered stance with Lt LE fwd to increase weight bearing through Rt LE 1x10  Verbal cues required for knee extension upon standing with minimal carryover from patient  Tap down squats with red TB around knees with staggered stance keeping Lt LE fwd to increase challenge in Rt LE, 1x10 with deficits in performance worsening with time and repetitions Seated marches 2x6 with decreased ROM/strength on Rt; compensatory movement with Lt lateral trunk lean and momentum Discussed adding sidelying hip flexion to HEP in order to improve strength  PT discussed returning to bridges with yellow TB in order to increase challenge during HEP   01/05/2024 TherEx:  Nustep level 4 for 8 minutes with bilat LE  PT discussed HEP, addition of bands, upcoming progressions with HEP, and importance of performing physical activity and exercise   Physical Performance:  FGA with results noted above  Discussed results with patient   12/31/2023 TherEx: Nustep level 4 for 8 minutes, 4 with bilat UE/LE and 4 with bilat LE only  Glute bridges 1x3 with PT placement of feet on table, patient performing with adduction and increased speed throughout reps; verbal cues given for both with patient having moderate carryover for speed of movement, but not adduction  PT placed yellow TB around knees for tactile cue to decrease adduction moment which improved performance,  2x8 ; intermittent reminders given for speed of movement  Yellow TB given to patient for HEP  Discussed adding calf raises and toe raises to HEP, patient performing 1x10 of each with difficulty on Rt LE secondary to MS diagnosis   Neuro Re-Ed:  General static balance assessment performed in parallel bars with CGA  Normal stance for 20s with no sway noted  Narrow stance for 20s with no sway noted  Tandem stance with mod-large increase in sway for 20s each  Unable to perform SLS safely   Narrow stance with eyes closed with CGA 1x20s with moderate increase in sway   Semi-tandem stance 2x20s each side with CGA  PT discussed 3 balance systems, how they interact, and how they may be impacted by MS diagnosis     PATIENT EDUCATION:  Education details: HEP, POC, osteopenia Person educated: Patient Education method: Explanation, Demonstration, Tactile cues, Verbal cues, and Handouts Education comprehension: verbalized understanding, returned demonstration, verbal cues required, tactile cues required, and needs further education  HOME EXERCISE PROGRAM: Access Code: [email or web address] URL: [email or web address] Date: 01/19/2024 Prepared by: Susannah Daring  Exercises - Standing Hip Abduction with Counter Support  - 1 x daily - 7 x weekly - 2 sets - 10 reps - Standing Hip Extension with Counter Support  - 1 x daily - 7 x weekly - 2 sets -  10 reps - Mini Squat with Counter Support  - 1 x daily - 7 x weekly - 2 sets - 10 reps - Seated Hamstring Stretch  - 1 x daily - 7 x weekly - 2 sets - 30s hold - Supine Bridge with Resistance Band  - 1 x daily - 7 x weekly - 2 sets - 10 reps - 2s hold - Bent Knee Fallouts  - 1 x daily - 7 x weekly - 2 sets - 10 reps - Sidelying Bent Knee Hip Flexion  - 1 x daily - 7 x weekly - 2 sets - 10 reps - Heel Raises with Counter Support  - 1 x daily - 7 x weekly - 2 sets - 10 reps - Heel Toe Raises with Counter Support  - 1 x daily - 7 x weekly - 2 sets - 10  reps  ASSESSMENT:  CLINICAL IMPRESSION: Patient continues to require verbal cues and tactile cues for form and pace.  Patient will continue to benefit from skilled PT.   OBJECTIVE IMPAIRMENTS: Abnormal gait, decreased activity tolerance, decreased balance, decreased coordination, decreased endurance, decreased mobility, difficulty walking, decreased ROM, decreased strength, impaired flexibility, improper body mechanics, postural dysfunction, and pain.   ACTIVITY LIMITATIONS: lifting, standing, squatting, stairs, and transfers  PARTICIPATION LIMITATIONS: community activity and occupation  PERSONAL FACTORS: Fitness, Past/current experiences, Time since onset of injury/illness/exacerbation, and 3+ comorbidities: multiple sclerosis, heart murmur, sleep apnea are also affecting patient's functional outcome.   REHAB POTENTIAL: Good  CLINICAL DECISION MAKING: Evolving/moderate complexity  EVALUATION COMPLEXITY: Moderate   GOALS: Goals reviewed with patient? Yes  SHORT TERM GOALS: Target date: 01/11/2024 Patient will show compliance with initial HEP.  Baseline: Goal status: ongoing, 01/12/2024  2.  Patient will report pain levels no greater than 4/10 in order to show improved overall quality of life. Baseline:  Goal status: GOAL MET, 01/12/2024   LONG TERM GOALS: Target date: 02/15/2024  Patient will be independent with final HEP in order to maintain and progress upon functional gains made within PT.  Baseline:  Goal status: INITIAL  2.  Patient will report pain levels no greater than 2/10 in order to show improved overall quality of life. Baseline:  Goal status: INITIAL  3.  Patient will increase PSFS to at least 8 in order to show a significant improvement in subjective disability rating. Baseline:  Goal status: INITIAL  4.  Patient will improve bilat hip abduction MMT to at least 4/5 in order to improve biomechanics with functional mobility. Baseline:  Goal status:  INITIAL  5.  Patient will improve bilat hip extension MMT to at least 4/5 in order to improve biomechanics with functional mobility. Baseline:  Goal status: INITIAL  6.  Patient will improve 5xSTS to at least 12s in order to decrease risk of falling.  Baseline:  Goal status: INITIAL   PLAN:  PT FREQUENCY: 1-2x/week  PT DURATION: 8 weeks  PLANNED INTERVENTIONS: 97164- PT Re-evaluation, 97750- Physical Performance Testing, 97110-Therapeutic exercises, 97530- Therapeutic activity, [id number]- Neuromuscular re-education, 97535- Self Care, [id number]- Manual therapy, [id number]- Gait training, [phone]- Canalith repositioning, [id number]- Electrical stimulation (unattended), [phone]- Electrical stimulation (manual), [id number]- Vasopneumatic device, [id number]- Ultrasound, [id number]- Traction (mechanical), [id number]- Ionotophoresis 4mg /ml Dexamethasone, 79439 (1-2 muscles), 20561 (3+ muscles)- Dry Needling, Patient/Family education, Balance training, Stair training, Taping, Joint mobilization, Joint manipulation, Spinal manipulation, Spinal mobilization, Scar mobilization, Vestibular training, DME instructions, Cryotherapy, and Moist heat  PLAN FOR NEXT SESSION:   review changes made to HEP, generalized LE strengthening, assessment  of gait with and without SPC, gait training with cane   Burnard Meth, PT 01/21/24  12:37 PM   Date of referral: 11/30/2023 Referring provider: Lonell Sprang, DO Referring diagnosis? M25.551,G89.29 (ICD-10-CM) - Chronic right hip pain R29.898 (ICD-10-CM) - Weakness of right hip M67.951 (ICD-10-CM) - Tendinopathy of right gluteal region Treatment diagnosis? (if different than referring diagnosis) M25.551, M25.651, M62.81, R26.89, R26.81  What was this (referring dx) caused by? Fall and Ongoing Issue  Lysle of Condition: Chronic (continuous duration > 3 months)   Laterality: Rt  Current Functional Measure Score: Patient Specific Functional Scale 6  Objective measurements identify impairments when they  are compared to normal values, the uninvolved extremity, and prior level of function.  [x]  Yes  []  No  Objective assessment of functional ability: Moderate functional limitations   Briefly describe symptoms: pain with palpation, weakness, unsteady on feet   How did symptoms start: patient slipped on ice several years ago and symptoms never dissipated  Average pain intensity:  Last 24 hours: 2/10  Past week: 5/10  How often does the pt experience symptoms? Frequently  How much have the symptoms interfered with usual daily activities? Moderately  How has condition changed since care began at this facility? NA - initial visit  In general, how is the patients overall health? Fair   BACK PAIN (STarT Back Screening Tool) No

## 2024-01-25 NOTE — Therapy (Signed)
 OUTPATIENT PHYSICAL THERAPY LOWER EXTREMITY TREATMENT   Patient Name: [name] [name] MRN: [id number] DOB:Jan 06, 1966, 58 y.o., male Today's Date: 01/26/2024  END OF SESSION:  PT End of Session - 01/26/24 1146     Visit Number 8    Number of Visits 16    Date for Recertification  02/15/24    Authorization Type [employer] MEDICARE $20 COPAY    Authorization Time Period 12/21/2023-02/15/2024    Authorization - Visit Number 8    Authorization - Number of Visits 16    Progress Note Due on Visit 10    PT Start Time 1145    PT Stop Time 1230    PT Time Calculation (min) 45 min    Activity Tolerance Patient tolerated treatment well    Behavior During Therapy [facility] for tasks assessed/performed           Past Medical History:  Diagnosis Date   Heart murmur    Multiple sclerosis    dx 2014   Neuromuscular disorder (HCC) 10/2012   high probability of MS   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    Other postprocedural status(V45.89)    inguinal herniorrhaphies bilateral    Sleep apnea    cpap   Testosterone  deficiency 11/04/11   Past Surgical History:  Procedure Laterality Date   HERNIA REPAIR Bilateral 1971   LAPAROSCOPIC CHOLECYSTECTOMY  2014   Patient Active Problem List   Diagnosis Date Noted   Right radial head fracture 12/19/2021   Right sided abdominal pain 02/04/2019   Former smoker 01/18/2019   Food allergy  10/24/2016   Other allergic rhinitis 10/24/2016   History of adenomatous polyp of colon 10/23/2014   Multiple sclerosis 11/03/2012   Hypogonadism male 10/06/2011    PCP: Norleen JAYSON Jobs, MD   REFERRING PROVIDER: Lonell Sprang, DO  REFERRING DIAG: [phone] (ICD-10-CM) - Chronic right hip pain R29.898 (ICD-10-CM) - Weakness of right hip M67.951 (ICD-10-CM) - Tendinopathy of right gluteal region  THERAPY DIAG:  Pain in right hip  Stiffness of right hip, not elsewhere classified  Muscle weakness (generalized)  Other  abnormalities of gait and mobility  Unsteadiness on feet  Rationale for Evaluation and Treatment: Rehabilitation  ONSET DATE: 2 years it was worse then finally went to Dr. Sprang 5 months ago  SUBJECTIVE:   SUBJECTIVE STATEMENT: Patient reports improvement in symptoms.   PERTINENT HISTORY: Patient experienced a fall 5 years ago where he had a fall on ice and landed on lateral Rt LE. The fall led to extensive bruising on entire lateral thigh as well as pain with no change over several months. Patient and PCP thought it was an effect from MS (diagnoses in 2014). Patient has received injection for bursitis from PCP within first year with no change in symptoms and is currently undergoing extracorporeal shockwave therapy through Dr. Sprang which is largely improving symptoms. Patient has had no other past surgeries or injuries to Rt LE/hip or surrounding areas.  PAIN:  Are you having pain? Yes: NPRS scale: 0/10   Pain location: Rt lateral hip, adductors/groin Pain description: dull Aggravating factors: deep tissue work  Relieving factors: shock therapy, mobility work   PRECAUTIONS: None  RED FLAGS: None   WEIGHT BEARING RESTRICTIONS: No  FALLS:  Has patient fallen in last 6 months? No  LIVING ENVIRONMENT: Lives with: dog Lives in: House/apartment Stairs: Yes: External: 2 steps; on right going up Has following equipment at home: Single point cane  OCCUPATION: disability,  financial trading  PLOF: Independent and uses single point cane   PATIENT GOALS: to be normal again  NEXT MD VISIT: this Friday and following Friday for shock therapy   OBJECTIVE:  Note: Objective measures were completed at Evaluation unless otherwise noted.  DIAGNOSTIC FINDINGS:  IMPRESSION: Osteopenia. No acute osseous abnormalities.  IMPRESSION: 1. Mild osteoarthritis of the right hip with anterosuperior labral tear. 2. Mild tendinosis of the bilateral gluteus medius tendons. 3. Mild  intramuscular edema within the right adductor magnus and brevis muscles at their origin on the right ischiopubic ramus, suggestive of a low-grade muscle strain.  PATIENT SURVEYS:  PSFS: THE PATIENT SPECIFIC FUNCTIONAL SCALE  Place score of 0-10 (0 = unable to perform activity and 10 = able to perform activity at the same level as before injury or problem)  Activity Date: 12/21/2023    Walking  7    2. Stairs  5    3.     4.      Total Score 6      Total Score = Sum of activity scores/number of activities  Minimally Detectable Change: 3 points (for single activity); 2 points (for average score)  [facility] (nd). The Patient Specific Functional Scale . Retrieved from [email or web address]   COGNITION: Overall cognitive status: Within functional limits for tasks assessed     SENSATION: Light touch: WFL  EDEMA:  Not assessed on eval  MUSCLE LENGTH: Not formally assessed, though hamstring tightness noted with SLR  POSTURE: rounded shoulders, forward head, and increased thoracic kyphosis  PALPATION: Not TTP in Rt glutes or lateral thigh   LOWER EXTREMITY ROM:  ROM Right Eval 12/21/2023 Left Eval 12/21/2023  Hip flexion 2+/5 3+/5  Hip extension 2+/5 3-/5  Hip abduction 3-/5 3-/5 with compensation into hip flexion  Hip adduction    Hip internal rotation    Hip external rotation    Knee flexion 4-/5 4/5  Knee extension 5/5 5/5  Ankle dorsiflexion 3+/5 4-/5  Ankle plantarflexion    Ankle inversion    Ankle eversion     (Blank rows = not tested)  LOWER EXTREMITY MMT:  MMT Right Eval 12/21/2023 Left Eval 12/21/2023  Hip flexion [facility] WFL  Hip extension    Hip abduction    Hip adduction    Hip internal rotation    Hip external rotation    Knee flexion    Knee extension    Ankle dorsiflexion    Ankle plantarflexion    Ankle inversion    Ankle eversion     (Blank rows = not tested)  LOWER EXTREMITY  SPECIAL TESTS:  Hip special tests: Belvie (FABER) test: negative and FADIR negative though patient notes muscle tightness in lateral glutes with FABER  FUNCTIONAL TESTS:  5 times sit to stand: 16.34s using bilat UEs and uncontrolled   GAIT: Distance walked: not formally assessed  Assistive device utilized: Single point cane Level of assistance: supervision  Comments: wide BOS, decreased knee flexion throughout bilaterally; will require further in depth assessment   [facility] PT Assessment - 01/08/24 0001       Functional Gait  Assessment   Gait Level Surface Walks 20 ft, slow speed, abnormal gait pattern, evidence for imbalance or deviates 10-15 in outside of the 12 in walkway width. Requires more than 7 sec to ambulate 20 ft.    Change in Gait Speed Makes only minor adjustments to walking speed, or accomplishes a change in speed with significant gait deviations, deviates 10-15  in outside the 12 in walkway width, or changes speed but loses balance but is able to recover and continue walking.    Gait with Horizontal Head Turns Performs head turns with moderate changes in gait velocity, slows down, deviates 10-15 in outside 12 in walkway width but recovers, can continue to walk.    Gait with Vertical Head Turns Performs task with moderate change in gait velocity, slows down, deviates 10-15 in outside 12 in walkway width but recovers, can continue to walk.    Gait and Pivot Turn Pivot turns safely in greater than 3 sec and stops with no loss of balance, or pivot turns safely within 3 sec and stops with mild imbalance, requires small steps to catch balance.    Step Over Obstacle Is able to step over one shoe box (4.5 in total height) but must slow down and adjust steps to clear box safely. May require verbal cueing.    Gait with Narrow Base of Support Ambulates less than 4 steps heel to toe or cannot perform without assistance.    Gait with Eyes Closed Walks 20 ft, slow speed, abnormal gait pattern,  evidence for imbalance, deviates 10-15 in outside 12 in walkway width. Requires more than 9 sec to ambulate 20 ft.    Ambulating Backwards Walks 20 ft, slow speed, abnormal gait pattern, evidence for imbalance, deviates 10-15 in outside 12 in walkway width.    Steps Two feet to a stair, must use rail.    Total Score 10    FGA comment: CGA throughout, done without AD, though typically ambulates with [facility]                                                                                                                                        TREATMENT DATE:  01/26/2024 TherEx:  UBE with bilat LE level 3 for 8 minutes  Standing calf raises with slow eccentric 1x15, 1x10  Supine abduction/adduction 3x8 with verbal cues for appropriate positioning  Supine SLR with focus on quad contraction, limited ROM secondary to strength, 3x8  Supine bridge with ball between knees for adduction contraction 3x8 with 2s hold  Sidelying clamshells 2x8 with 2s hold    01/21/24 TherEx:  Recumbent bike level 3 for 8 minutes  Sidelying hip flexion with PT assist for appropriate positioning, verbal cues for full hip extension with appropriate carryover 2x8 each LE  Supine ball squeezes for adduction 2x10 with 3s squeeze Supine bent knee fallouts 2x10 each side; verbal cues for speed of movement with appropriate carryover  Clamshells 2x10 each side Supine marching 3x10 with TA activation and VC and touching goal Sit to stand with TA activation and 2# ball     01/19/2024 TherEx:  UBE with bilat LE level 3 for 8 minutes  PT discussed HEP with additions  Supine marches with 2# ankle weights with focus on TA activation and slow  and controlled movements 3x20 Cues for appropriate form and speed required throughout  Attempted sidelying adduction, highly difficult ,discontinued  Supine abduction/adduction 3x8 each side  Discussed toe out vs toe straight  RDLs with 5# DB 1x15   01/14/2024 TherEx:  Recumbent bike  level 3 for 8 minutes  Sidelying hip flexion with PT assist for appropriate positioning, verbal cues for full hip extension with appropriate carryover 2x8 each LE  Supine abduction/adduction 2x8 each side; PT assist for AAROM during first set for both LE, transitioned to AROM  Intermittent verbal cues to decrease speed of movement  Supine ball squeezes for adduction 2x10 with 3s squeeze Supine bent knee fallouts 2x10 each side; verbal cues for speed of movement with appropriate carryover   Gait Training:  PT educating patient and demonstrating 2-point technique with SPC on Lt side in order to increase BOS and support appropriate gait mechanics with arm swing ; patient attempted, though unable to perform prior to end of session   01/12/2024 TherEx:  Recumbent bike level 3 for 8 minutes  Bilat leg press 1x3 with large genu recurvatem moment on Rt LE with knee extension  Seated LAQ with focus on slow and controlled movement 2x12 each LE with 3s eccentric  Intermittent verbal cues for speed of movement  Sit<>stands with red TB around knees for abduction cue 1x4; changed to staggered stance with Lt LE fwd to increase weight bearing through Rt LE 1x10  Verbal cues required for knee extension upon standing with minimal carryover from patient  Tap down squats with red TB around knees with staggered stance keeping Lt LE fwd to increase challenge in Rt LE, 1x10 with deficits in performance worsening with time and repetitions Seated marches 2x6 with decreased ROM/strength on Rt; compensatory movement with Lt lateral trunk lean and momentum Discussed adding sidelying hip flexion to HEP in order to improve strength  PT discussed returning to bridges with yellow TB in order to increase challenge during HEP      PATIENT EDUCATION:  Education details: HEP, POC, osteopenia Person educated: Patient Education method: Explanation, Demonstration, Tactile cues, Verbal cues, and Handouts Education  comprehension: verbalized understanding, returned demonstration, verbal cues required, tactile cues required, and needs further education  HOME EXERCISE PROGRAM: Access Code: [email or web address] URL: [email or web address] Date: 01/19/2024 Prepared by: Susannah Daring  Exercises - Standing Hip Abduction with Counter Support  - 1 x daily - 7 x weekly - 2 sets - 10 reps - Standing Hip Extension with Counter Support  - 1 x daily - 7 x weekly - 2 sets - 10 reps - Mini Squat with Counter Support  - 1 x daily - 7 x weekly - 2 sets - 10 reps - Seated Hamstring Stretch  - 1 x daily - 7 x weekly - 2 sets - 30s hold - Supine Bridge with Resistance Band  - 1 x daily - 7 x weekly - 2 sets - 10 reps - 2s hold - Bent Knee Fallouts  - 1 x daily - 7 x weekly - 2 sets - 10 reps - Sidelying Bent Knee Hip Flexion  - 1 x daily - 7 x weekly - 2 sets - 10 reps - Heel Raises with Counter Support  - 1 x daily - 7 x weekly - 2 sets - 10 reps - Heel Toe Raises with Counter Support  - 1 x daily - 7 x weekly - 2 sets - 10 reps  ASSESSMENT:  CLINICAL IMPRESSION:  Patient arrived to session  noting improvement in symptoms and strength. Patient tolerated all activities this date with intermittent verbal cues for control and appropriate form. Patient will continue to benefit from skilled PT.    OBJECTIVE IMPAIRMENTS: Abnormal gait, decreased activity tolerance, decreased balance, decreased coordination, decreased endurance, decreased mobility, difficulty walking, decreased ROM, decreased strength, impaired flexibility, improper body mechanics, postural dysfunction, and pain.   ACTIVITY LIMITATIONS: lifting, standing, squatting, stairs, and transfers  PARTICIPATION LIMITATIONS: community activity and occupation  PERSONAL FACTORS: Fitness, Past/current experiences, Time since onset of injury/illness/exacerbation, and 3+ comorbidities: multiple sclerosis, heart murmur, sleep apnea are also affecting patient's functional  outcome.   REHAB POTENTIAL: Good  CLINICAL DECISION MAKING: Evolving/moderate complexity  EVALUATION COMPLEXITY: Moderate   GOALS: Goals reviewed with patient? Yes  SHORT TERM GOALS: Target date: 01/11/2024 Patient will show compliance with initial HEP.  Baseline: Goal status: ongoing, 01/12/2024  2.  Patient will report pain levels no greater than 4/10 in order to show improved overall quality of life. Baseline:  Goal status: GOAL MET, 01/12/2024   LONG TERM GOALS: Target date: 02/15/2024  Patient will be independent with final HEP in order to maintain and progress upon functional gains made within PT.  Baseline:  Goal status: INITIAL  2.  Patient will report pain levels no greater than 2/10 in order to show improved overall quality of life. Baseline:  Goal status: INITIAL  3.  Patient will increase PSFS to at least 8 in order to show a significant improvement in subjective disability rating. Baseline:  Goal status: INITIAL  4.  Patient will improve bilat hip abduction MMT to at least 4/5 in order to improve biomechanics with functional mobility. Baseline:  Goal status: INITIAL  5.  Patient will improve bilat hip extension MMT to at least 4/5 in order to improve biomechanics with functional mobility. Baseline:  Goal status: INITIAL  6.  Patient will improve 5xSTS to at least 12s in order to decrease risk of falling.  Baseline:  Goal status: INITIAL   PLAN:  PT FREQUENCY: 1-2x/week  PT DURATION: 8 weeks  PLANNED INTERVENTIONS: 97164- PT Re-evaluation, 97750- Physical Performance Testing, 97110-Therapeutic exercises, 97530- Therapeutic activity, [id number]- Neuromuscular re-education, 97535- Self Care, [id number]- Manual therapy, [id number]- Gait training, [phone]- Canalith repositioning, [id number]- Electrical stimulation (unattended), [phone]- Electrical stimulation (manual), [id number]- Vasopneumatic device, [id number]- Ultrasound, [id number]- Traction (mechanical), [id number]- Ionotophoresis 4mg /ml  Dexamethasone, 79439 (1-2 muscles), 20561 (3+ muscles)- Dry Needling, Patient/Family education, Balance training, Stair training, Taping, Joint mobilization, Joint manipulation, Spinal manipulation, Spinal mobilization, Scar mobilization, Vestibular training, DME instructions, Cryotherapy, and Moist heat  PLAN FOR NEXT SESSION:    review changes made to HEP, generalized LE strengthening   Susannah Daring, PT, DPT 01/26/24 12:40 PM     Date of referral: 11/30/2023 Referring provider: Lonell Sprang, DO Referring diagnosis? M25.551,G89.29 (ICD-10-CM) - Chronic right hip pain R29.898 (ICD-10-CM) - Weakness of right hip M67.951 (ICD-10-CM) - Tendinopathy of right gluteal region Treatment diagnosis? (if different than referring diagnosis) M25.551, M25.651, M62.81, R26.89, R26.81  What was this (referring dx) caused by? Fall and Ongoing Issue  Lysle of Condition: Chronic (continuous duration > 3 months)   Laterality: Rt  Current Functional Measure Score: Patient Specific Functional Scale 6  Objective measurements identify impairments when they are compared to normal values, the uninvolved extremity, and prior level of function.  [x]  Yes  []  No  Objective assessment of functional ability: Moderate functional limitations   Briefly describe symptoms: pain with palpation, weakness, unsteady on feet   How did symptoms  start: patient slipped on ice several years ago and symptoms never dissipated  Average pain intensity:  Last 24 hours: 2/10  Past week: 5/10  How often does the pt experience symptoms? Frequently  How much have the symptoms interfered with usual daily activities? Moderately  How has condition changed since care began at this facility? NA - initial visit  In general, how is the patients overall health? Fair   BACK PAIN (STarT Back Screening Tool) No

## 2024-01-26 ENCOUNTER — Ambulatory Visit

## 2024-01-26 ENCOUNTER — Encounter

## 2024-01-26 DIAGNOSIS — M25551 Pain in right hip: Secondary | ICD-10-CM

## 2024-01-26 DIAGNOSIS — M25651 Stiffness of right hip, not elsewhere classified: Secondary | ICD-10-CM

## 2024-01-26 DIAGNOSIS — M6281 Muscle weakness (generalized): Secondary | ICD-10-CM | POA: Diagnosis not present

## 2024-01-26 DIAGNOSIS — R2681 Unsteadiness on feet: Secondary | ICD-10-CM

## 2024-01-26 DIAGNOSIS — R2689 Other abnormalities of gait and mobility: Secondary | ICD-10-CM | POA: Diagnosis not present

## 2024-01-27 NOTE — Therapy (Incomplete)
 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    Other postprocedural status(V45.89)    inguinal herniorrhaphies bilateral    Sleep apnea    cpap   Testosterone  deficiency 11/04/11   Past Surgical History:  Procedure Laterality Date   HERNIA REPAIR Bilateral 1971   LAPAROSCOPIC CHOLECYSTECTOMY  2014   Patient Active Problem List   Diagnosis Date Noted   Right radial head fracture 12/19/2021   Right sided abdominal pain 02/04/2019   Former smoker 01/18/2019   Food allergy  10/24/2016   Other allergic rhinitis 10/24/2016   History of adenomatous polyp of colon 10/23/2014   Multiple sclerosis 11/03/2012   Hypogonadism male 10/06/2011    PCP: Norleen JAYSON Jobs, MD   REFERRING PROVIDER: Lonell Sprang, DO  REFERRING DIAG: [phone] (ICD-10-CM) - Chronic right hip pain R29.898 (ICD-10-CM) - Weakness of right hip M67.951 (ICD-10-CM) - Tendinopathy of right gluteal region  THERAPY DIAG:  No diagnosis found.  Rationale for Evaluation and Treatment: Rehabilitation  ONSET DATE: 2 years it was worse then finally went to Dr. Sprang 5 months ago  SUBJECTIVE:   SUBJECTIVE STATEMENT: *** Patient reports improvement in symptoms.   PERTINENT HISTORY: Patient experienced a fall 5 years ago where he had a fall on ice and landed on lateral Rt LE. The fall led to extensive bruising on entire lateral thigh as well as pain with no change over several months. Patient and PCP thought it was an effect from MS (diagnoses in 2014). Patient has received injection for bursitis from PCP within first year with no change in symptoms and is  currently undergoing extracorporeal shockwave therapy through Dr. Sprang which is largely improving symptoms. Patient has had no other past surgeries or injuries to Rt LE/hip or surrounding areas.  PAIN:  Are you having pain? Yes: NPRS scale: 0/10   Pain location: Rt lateral hip, adductors/groin Pain description: dull Aggravating factors: deep tissue work  Relieving factors: shock therapy, mobility work   PRECAUTIONS: None  RED FLAGS: None   WEIGHT BEARING RESTRICTIONS: No  FALLS:  Has patient fallen in last 6 months? No  LIVING ENVIRONMENT: Lives with: dog Lives in: House/apartment Stairs: Yes: External: 2 steps; on right going up Has following equipment at home: Single point cane  OCCUPATION: disability, financial trading  PLOF: Independent and uses single point cane   PATIENT GOALS: to be normal again  NEXT MD VISIT: this Friday and following Friday for shock therapy   OBJECTIVE:  Note: Objective measures were completed at Evaluation unless otherwise noted.  DIAGNOSTIC FINDINGS:  IMPRESSION: Osteopenia. No acute osseous abnormalities.  IMPRESSION: 1. Mild osteoarthritis of the right hip with anterosuperior labral tear. 2. Mild tendinosis of the bilateral gluteus medius tendons. 3. Mild intramuscular edema within the right adductor magnus and brevis muscles at their origin on the right ischiopubic ramus, suggestive of a low-grade muscle strain.  PATIENT SURVEYS:  PSFS: THE PATIENT SPECIFIC FUNCTIONAL SCALE  Place score of 0-10 (0 = unable to perform activity and 10 = able to perform activity at the same level as before injury or problem)  Activity Date: 12/21/2023  Walking  7    2. Stairs  5    3.     4.      Total Score 6      Total Score = Sum of activity scores/number of activities  Minimally Detectable Change: 3 points (for single activity); 2 points (for average score)  [facility] (nd). The Patient Specific Functional Scale .  Retrieved from [email or web address]   COGNITION: Overall cognitive status: Within functional limits for tasks assessed     SENSATION: Light touch: WFL  EDEMA:  Not assessed on eval  MUSCLE LENGTH: Not formally assessed, though hamstring tightness noted with SLR  POSTURE: rounded shoulders, forward head, and increased thoracic kyphosis  PALPATION: Not TTP in Rt glutes or lateral thigh   LOWER EXTREMITY ROM:  ROM Right Eval 12/21/2023 Left Eval 12/21/2023  Hip flexion 2+/5 3+/5  Hip extension 2+/5 3-/5  Hip abduction 3-/5 3-/5 with compensation into hip flexion  Hip adduction    Hip internal rotation    Hip external rotation    Knee flexion 4-/5 4/5  Knee extension 5/5 5/5  Ankle dorsiflexion 3+/5 4-/5  Ankle plantarflexion    Ankle inversion    Ankle eversion     (Blank rows = not tested)  LOWER EXTREMITY MMT:  MMT Right Eval 12/21/2023 Left Eval 12/21/2023  Hip flexion [facility] WFL  Hip extension    Hip abduction    Hip adduction    Hip internal rotation    Hip external rotation    Knee flexion    Knee extension    Ankle dorsiflexion    Ankle plantarflexion    Ankle inversion    Ankle eversion     (Blank rows = not tested)  LOWER EXTREMITY SPECIAL TESTS:  Hip special tests: Belvie (FABER) test: negative and FADIR negative though patient notes muscle tightness in lateral glutes with FABER  FUNCTIONAL TESTS:  5 times sit to stand: 16.34s using bilat UEs and uncontrolled   GAIT: Distance walked: not formally assessed  Assistive device utilized: Single point cane Level of assistance: supervision  Comments: wide BOS, decreased knee flexion throughout bilaterally; will require further in depth assessment   [facility] PT Assessment - 01/08/24 0001       Functional Gait  Assessment   Gait Level Surface Walks 20 ft, slow speed, abnormal gait pattern, evidence for imbalance or deviates 10-15 in outside of the 12 in  walkway width. Requires more than 7 sec to ambulate 20 ft.    Change in Gait Speed Makes only minor adjustments to walking speed, or accomplishes a change in speed with significant gait deviations, deviates 10-15 in outside the 12 in walkway width, or changes speed but loses balance but is able to recover and continue walking.    Gait with Horizontal Head Turns Performs head turns with moderate changes in gait velocity, slows down, deviates 10-15 in outside 12 in walkway width but recovers, can continue to walk.    Gait with Vertical Head Turns Performs task with moderate change in gait velocity, slows down, deviates 10-15 in outside 12 in walkway width but recovers, can continue to walk.    Gait and Pivot Turn Pivot turns safely in greater than 3 sec and stops with no loss of balance, or pivot turns safely within 3 sec and stops with mild imbalance, requires small steps to catch balance.    Step Over Obstacle Is able to step over one shoe box (4.5 in total height) but must  slow down and adjust steps to clear box safely. May require verbal cueing.    Gait with Narrow Base of Support Ambulates less than 4 steps heel to toe or cannot perform without assistance.    Gait with Eyes Closed Walks 20 ft, slow speed, abnormal gait pattern, evidence for imbalance, deviates 10-15 in outside 12 in walkway width. Requires more than 9 sec to ambulate 20 ft.    Ambulating Backwards Walks 20 ft, slow speed, abnormal gait pattern, evidence for imbalance, deviates 10-15 in outside 12 in walkway width.    Steps Two feet to a stair, must use rail.    Total Score 10    FGA comment: CGA throughout, done without AD, though typically ambulates with [facility]                                                                                                                                        TREATMENT DATE:  01/28/2024 ***  01/26/2024 TherEx:  UBE with bilat LE level 3 for 8 minutes  Standing calf raises with slow  eccentric 1x15, 1x10  Supine abduction/adduction 3x8 with verbal cues for appropriate positioning  Supine SLR with focus on quad contraction, limited ROM secondary to strength, 3x8  Supine bridge with ball between knees for adduction contraction 3x8 with 2s hold  Sidelying clamshells 2x8 with 2s hold    01/21/24 TherEx:  Recumbent bike level 3 for 8 minutes  Sidelying hip flexion with PT assist for appropriate positioning, verbal cues for full hip extension with appropriate carryover 2x8 each LE  Supine ball squeezes for adduction 2x10 with 3s squeeze Supine bent knee fallouts 2x10 each side; verbal cues for speed of movement with appropriate carryover  Clamshells 2x10 each side Supine marching 3x10 with TA activation and VC and touching goal Sit to stand with TA activation and 2# ball     01/19/2024 TherEx:  UBE with bilat LE level 3 for 8 minutes  PT discussed HEP with additions  Supine marches with 2# ankle weights with focus on TA activation and slow and controlled movements 3x20 Cues for appropriate form and speed required throughout  Attempted sidelying adduction, highly difficult ,discontinued  Supine abduction/adduction 3x8 each side  Discussed toe out vs toe straight  RDLs with 5# DB 1x15   01/14/2024 TherEx:  Recumbent bike level 3 for 8 minutes  Sidelying hip flexion with PT assist for appropriate positioning, verbal cues for full hip extension with appropriate carryover 2x8 each LE  Supine abduction/adduction 2x8 each side; PT assist for AAROM during first set for both LE, transitioned to AROM  Intermittent verbal cues to decrease speed of movement  Supine ball squeezes for adduction 2x10 with 3s squeeze Supine bent knee fallouts 2x10 each side; verbal cues for speed of movement with appropriate carryover   Gait Training:  PT educating patient and demonstrating 2-point technique with SPC on Lt side  in order to increase BOS and support appropriate gait mechanics with  arm swing ; patient attempted, though unable to perform prior to end of session     PATIENT EDUCATION:  Education details: HEP, POC, osteopenia Person educated: Patient Education method: Explanation, Demonstration, Tactile cues, Verbal cues, and Handouts Education comprehension: verbalized understanding, returned demonstration, verbal cues required, tactile cues required, and needs further education  HOME EXERCISE PROGRAM: Access Code: [email or web address] URL: [email or web address] Date: 01/19/2024 Prepared by: Susannah Daring  Exercises - Standing Hip Abduction with Counter Support  - 1 x daily - 7 x weekly - 2 sets - 10 reps - Standing Hip Extension with Counter Support  - 1 x daily - 7 x weekly - 2 sets - 10 reps - Mini Squat with Counter Support  - 1 x daily - 7 x weekly - 2 sets - 10 reps - Seated Hamstring Stretch  - 1 x daily - 7 x weekly - 2 sets - 30s hold - Supine Bridge with Resistance Band  - 1 x daily - 7 x weekly - 2 sets - 10 reps - 2s hold - Bent Knee Fallouts  - 1 x daily - 7 x weekly - 2 sets - 10 reps - Sidelying Bent Knee Hip Flexion  - 1 x daily - 7 x weekly - 2 sets - 10 reps - Heel Raises with Counter Support  - 1 x daily - 7 x weekly - 2 sets - 10 reps - Heel Toe Raises with Counter Support  - 1 x daily - 7 x weekly - 2 sets - 10 reps  ASSESSMENT:  CLINICAL IMPRESSION:  *** Patient arrived to session noting improvement in symptoms and strength. Patient tolerated all activities this date with intermittent verbal cues for control and appropriate form. Patient will continue to benefit from skilled PT.    OBJECTIVE IMPAIRMENTS: Abnormal gait, decreased activity tolerance, decreased balance, decreased coordination, decreased endurance, decreased mobility, difficulty walking, decreased ROM, decreased strength, impaired flexibility, improper body mechanics, postural dysfunction, and pain.   ACTIVITY LIMITATIONS: lifting, standing, squatting, stairs, and  transfers  PARTICIPATION LIMITATIONS: community activity and occupation  PERSONAL FACTORS: Fitness, Past/current experiences, Time since onset of injury/illness/exacerbation, and 3+ comorbidities: multiple sclerosis, heart murmur, sleep apnea are also affecting patient's functional outcome.   REHAB POTENTIAL: Good  CLINICAL DECISION MAKING: Evolving/moderate complexity  EVALUATION COMPLEXITY: Moderate   GOALS: Goals reviewed with patient? Yes  SHORT TERM GOALS: Target date: 01/11/2024 Patient will show compliance with initial HEP.  Baseline: Goal status: ongoing, 01/12/2024  2.  Patient will report pain levels no greater than 4/10 in order to show improved overall quality of life. Baseline:  Goal status: GOAL MET, 01/12/2024   LONG TERM GOALS: Target date: 02/15/2024  Patient will be independent with final HEP in order to maintain and progress upon functional gains made within PT.  Baseline:  Goal status: INITIAL  2.  Patient will report pain levels no greater than 2/10 in order to show improved overall quality of life. Baseline:  Goal status: INITIAL  3.  Patient will increase PSFS to at least 8 in order to show a significant improvement in subjective disability rating. Baseline:  Goal status: INITIAL  4.  Patient will improve bilat hip abduction MMT to at least 4/5 in order to improve biomechanics with functional mobility. Baseline:  Goal status: INITIAL  5.  Patient will improve bilat hip extension MMT to at least 4/5 in order to improve biomechanics with functional mobility. Baseline:  Goal status: INITIAL  6.  Patient will improve 5xSTS to at least 12s in order to decrease risk of falling.  Baseline:  Goal status: INITIAL   PLAN:  PT FREQUENCY: 1-2x/week  PT DURATION: 8 weeks  PLANNED INTERVENTIONS: 97164- PT Re-evaluation, 97750- Physical Performance Testing, 97110-Therapeutic exercises, 97530- Therapeutic activity, [id number]- Neuromuscular re-education,  97535- Self Care, 02859- Manual therapy, [id number]- Gait training, [phone]- Canalith repositioning, H9716- Electrical stimulation (unattended), [id number]- Electrical stimulation (manual), [id number]- Vasopneumatic device, [id number]- Ultrasound, [id number]- Traction (mechanical), [id number]- Ionotophoresis 4mg /ml Dexamethasone, 79439 (1-2 muscles), 20561 (3+ muscles)- Dry Needling, Patient/Family education, Balance training, Stair training, Taping, Joint mobilization, Joint manipulation, Spinal manipulation, Spinal mobilization, Scar mobilization, Vestibular training, DME instructions, Cryotherapy, and Moist heat  PLAN FOR NEXT SESSION:   *** review changes made to HEP, generalized LE strengthening   Susannah Daring, PT, DPT 01/27/24 12:57 PM     Date of referral: 11/30/2023 Referring provider: Lonell Sprang, DO Referring diagnosis? M25.551,G89.29 (ICD-10-CM) - Chronic right hip pain R29.898 (ICD-10-CM) - Weakness of right hip M67.951 (ICD-10-CM) - Tendinopathy of right gluteal region Treatment diagnosis? (if different than referring diagnosis) M25.551, M25.651, M62.81, R26.89, R26.81  What was this (referring dx) caused by? Fall and Ongoing Issue  Lysle of Condition: Chronic (continuous duration > 3 months)   Laterality: Rt  Current Functional Measure Score: Patient Specific Functional Scale 6  Objective measurements identify impairments when they are compared to normal values, the uninvolved extremity, and prior level of function.  [x]  Yes  []  No  Objective assessment of functional ability: Moderate functional limitations   Briefly describe symptoms: pain with palpation, weakness, unsteady on feet   How did symptoms start: patient slipped on ice several years ago and symptoms never dissipated  Average pain intensity:  Last 24 hours: 2/10  Past week: 5/10  How often does the pt experience symptoms? Frequently  How much have the symptoms interfered with usual daily activities? Moderately  How has condition  changed since care began at this facility? NA - initial visit  In general, how is the patients overall health? Fair   BACK PAIN (STarT Back Screening Tool) No

## 2024-01-28 ENCOUNTER — Encounter

## 2024-02-11 ENCOUNTER — Ambulatory Visit: Admitting: Sports Medicine

## 2024-02-11 NOTE — Therapy (Signed)
 OUTPATIENT OCCUPATIONAL THERAPY ORTHO EVALUATION  Patient Name: [name] [name] MRN: [id number] DOB:Jan 21, 1966, 58 y.o., male Today's Date: 02/15/2024  PCP: [facility] MD REFERRING PROVIDER: Burnetta Brunet, DO   END OF SESSION:  OT End of Session - 02/15/24 1301     Visit Number 1    Number of Visits 7    Date for Recertification  04/01/24    Authorization Type [employer]    OT Start Time 1301    OT Stop Time 1358    OT Time Calculation (min) 57 min    Activity Tolerance Patient tolerated treatment well;No increased pain;Patient limited by fatigue;Patient limited by pain          Past Medical History:  Diagnosis Date   Heart murmur    Multiple sclerosis    dx 2014   Neuromuscular disorder (HCC) 10/2012   high probability of MS   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    Other postprocedural status(V45.89)    inguinal herniorrhaphies bilateral    Sleep apnea    cpap   Testosterone  deficiency 11/04/11   Past Surgical History:  Procedure Laterality Date   HERNIA REPAIR Bilateral 1971   LAPAROSCOPIC CHOLECYSTECTOMY  2014   Patient Active Problem List   Diagnosis Date Noted   Right radial head fracture 12/19/2021   Right sided abdominal pain 02/04/2019   Former smoker 01/18/2019   Food allergy  10/24/2016   Other allergic rhinitis 10/24/2016   History of adenomatous polyp of colon 10/23/2014   Multiple sclerosis 11/03/2012   Hypogonadism male 10/06/2011    ONSET DATE: Chronic  REFERRING DIAG:  S52.121S (ICD-10-CM) - Closed displaced fracture of head of right radius, sequela  M25.631 (ICD-10-CM) - Wrist stiffness, right    THERAPY DIAG:  Other lack of coordination - Plan: Ot plan of care cert/re-cert  Pain in right wrist - Plan: Ot plan of care cert/re-cert  Stiffness of right elbow, not elsewhere classified - Plan: Ot plan of care cert/re-cert  Muscle weakness (generalized) - Plan: Ot plan of care cert/re-cert  Rationale  for Evaluation and Treatment: Rehabilitation  SUBJECTIVE:   SUBJECTIVE STATEMENT: He states decreased sensation in Rt hand, some tenderness in rt forearm also in the extensors of the thumb, index, and small fingers chronically after breaking right arm.  He states motion and coordination feel delayed in the Rt arm. He does have MS and lower body issues as well. He states falling and injuring his Rt hip in the past, but also states his MS affects his Rt leg over his Lt leg.  He mainly describes decreased coordination and sensation which seems to be nerve related -likely MS.  He does not complain much of elbow pain or discomfort, but does show some signs of radial nerve irritation near the fracture site at the right elbow.    PERTINENT HISTORY: Chronic right wrist/forearm discomfort with pronation restriction s/p old radial head and triquetral fracture   PRECAUTIONS: Fall (ensure good balance and close proximity to the patient)  RED FLAGS: None   WEIGHT BEARING RESTRICTIONS: No  PAIN:  Are you having pain?  No significant pain at rest-Main complaints are paresthesia numbness and decreased coordination in the left hand and arm.  FALLS: Has patient fallen in last 6 months? No, but still considered a mild risk for falls due to needing a cane and having balance issues  PLOF: Independent, uses a cane for community mobility  PATIENT GOALS: To improve paresthesia and  coordination in the right hand and arm  NEXT MD VISIT: As needed   OBJECTIVE: (All objective assessments below are from initial evaluation on: 02/15/24 unless otherwise specified.)   HAND DOMINANCE: Right   ADLs: Overall ADLs: States decreased ability to grab, hold household objects, pain and difficulty to open containers, perform FMS tasks (manipulate fasteners on clothing), mild to moderate bathing problems as well.    FUNCTIONAL OUTCOME MEASURES: Eval: Patient Specific Functional Scale: 5 (writing, fasteners, pills)   (Higher Score  =  Better Ability for the Selected Tasks)       UPPER EXTREMITY ROM     Shoulder to Wrist AROM Right TBD  Elbow flexion   Elbow extension   Forearm supination   Forearm pronation    Wrist flexion   Wrist extension   Wrist ulnar deviation   Wrist radial deviation   (Blank rows = not tested)   Hand AROM Right eval Left eval  Full Fist Ability (or Gap to Distal Palmar Crease) Loose full fist, with slower motion Full fist  Thumb Opposition  (Kapandji Scale)  Within functional limits Within functional limits  (Blank rows = not tested)   UPPER EXTREMITY MMT:     MMT Right 02/15/24  Elbow flexion   Elbow extension   Forearm supination 4+/5  Forearm pronation 5/5  Wrist flexion 5/5  Wrist extension 4-/5  Finger extension 4/5  Finger abduction  4/5 (4-/5 in Lt hand)   (Blank rows = not tested)  HAND FUNCTION: Eval: Observed weakness in affected Rt hand.  Grip strength Right: 51.4 lbs, Left: 67 lbs   COORDINATION: Eval: Observed coordination impairments with affected Rt hand. 9 Hole Peg Test Right: 30.4sec, Left: 24.7 sec (25 sec is WFL in Rt hand)   SENSATION: Eval:  Light touch is significantly diminished in the right hand ulnar nerve distribution per static 2-point discrimination test.  Median nerve is also mildly diminished.  He also shows weakness to the radial nerve distribution with some tenderness to the radial tunnel area   S2PDT:  Rt Median 6-90mm; ulnar 12mm   EDEMA:   Eval: None significant  COGNITION: Eval: Overall cognitive status: WFL for evaluation today   OBSERVATIONS:   Eval: With the dominant right hand and arm he shows decreased coordination and grip strength, also some weakness and irritation to the radial nerve distribution after breaking the radial head years ago.  He also shows some weakness and stiffness with wrist and finger flexors as well as some weakness with finger abduction and wrist extension.   Weakness in the radial  nerve distribution (similar to radial tunnel syndrome), weakness of grip and decreased coordination likely caused by MS and degenerative nerve issues.   TODAY'S TREATMENT:  Post-evaluation treatment:   OT educates on likely entrapment of the radial nerve or damage of the radial nerve near the old radial head injury.  To help with this, OT recommends radial nerve glides.  OT also educates on the importance of intensity and volume of training for poor nerve health and degenerative nerve conditions like MS.  He was recommended to do activities like prolonged squeezing with a towel roll to help improve grip training.  There are other techniques to try, but we have run out of time on evaluation today to educate on any further.  Exercises - Towel Roll Grip with Forearm in Neutral  - 3 x daily - 5 reps - 10 sec hold - Standing Radial Nerve Glide  - 4-6 x  daily - 1 sets - 10-15 reps  PATIENT EDUCATION: Education details: See tx section above for details  Person educated: Patient Education method: Verbal Instruction, Teach back, Handouts  Education comprehension: States and demonstrates understanding, Additional Education required    HOME EXERCISE PROGRAM: Access Code: [email or web address] URL: [email or web address] Date: 02/15/2024 Prepared by: Melvenia Ada   GOALS: Goals reviewed with patient? Yes   SHORT TERM GOALS: (STG required if POC>30 days) Target Date: 03/04/2024  Pt will demo/state understanding of initial HEP to improve pain levels and prerequisite motion. Goal status: INITIAL   LONG TERM GOALS: Target Date: 04/01/2024  Pt will improve functional ability by decreased impairment per PSFS assessment from 5 to 7 or better, for better quality of life. Goal status: INITIAL  2.  Pt will improve grip strength in right hand from 51 pounds to at least 60lbs for functional use at home and in IADLs. Goal status: INITIAL  3.  Pt will improve strength in right wrist extension  from 4 -/5 MMT to at least 4+/5 MMT to have increased functional ability to carry out selfcare and higher-level homecare tasks with less difficulty. Goal status: INITIAL  4.  Pt will improve coordination skills in right hand, as seen by within functional limit score on nine-hole peg testing to have increased functional ability to carry out fine motor tasks (fasteners, etc.) and more complex, coordinated IADLs (meal prep, sports, etc.).  Goal status: INITIAL    ASSESSMENT:  CLINICAL IMPRESSION: Patient is a 58 y.o. male who was seen today for occupational therapy evaluation for weakness, decreased coordination, decreased sensation in the right hand and arm thought to be from radial tunnel syndrome after breaking the elbow, and also deficits related to MS.  The patient will benefit from outpatient occupational therapy to decrease symptoms, improve functional upper extremity use, and increase quality of life.  PERFORMANCE DEFICITS: in functional skills including ADLs, IADLs, coordination, sensation, tone, ROM, strength, pain, fascial restrictions, Fine motor control, body mechanics, endurance, and UE functional use, cognitive skills including problem solving and safety awareness, and psychosocial skills including coping strategies, environmental adaptation, habits, and routines and behaviors.   IMPAIRMENTS: are limiting patient from ADLs, IADLs, rest and sleep, and leisure.   COMORBIDITIES: has co-morbidities such as MS, heart murmur, chromosomal anomalies, and possibly others that affects occupational performance. Patient will benefit from skilled OT to address above impairments and improve overall function.  MODIFICATION OR ASSISTANCE TO COMPLETE EVALUATION: Min-Moderate modification of tasks or assist with assess necessary to complete an evaluation.  OT OCCUPATIONAL PROFILE AND HISTORY: Detailed assessment: Review of records and additional review of physical, cognitive, psychosocial history  related to current functional performance.  CLINICAL DECISION MAKING: Moderate - several treatment options, min-mod task modification necessary  REHAB POTENTIAL: Excellent  EVALUATION COMPLEXITY: Moderate      PLAN:  OT FREQUENCY: 1-2x/week  OT DURATION: 6 weeks through 04/01/2024 and up to 7 total visits as needed   PLANNED INTERVENTIONS: 97535 self care/ADL training, 02889 therapeutic exercise, 97530 therapeutic activity, 97112 neuromuscular re-education, 97140 manual therapy, 97035 ultrasound, 97032 electrical stimulation (manual), 97760 Orthotic Initial, [id number] Orthotic/Prosthetic subsequent, compression bandaging, Dry needling, energy conservation, coping strategies training, and patient/family education  RECOMMENDED OTHER SERVICES: currently in PT for hip pain   CONSULTED AND AGREED WITH PLAN OF CARE: Patient  PLAN FOR NEXT SESSION:   Review initial HEP and recommendations -   add nerve gliding, add coordination, add exaggerated motion and speed, add proprioception, prolonged contraction strengthening , check  forearm and wrist motion   Melvenia Ada, OTR/L, CHT  02/15/2024, 5:48 PM     Date of referral: 12/25/23 Referring provider:  Burnetta Brunet, DO   Referring diagnosis?  S52.121S (ICD-10-CM) - Closed displaced fracture of head of right radius, sequela  M25.631 (ICD-10-CM) - Wrist stiffness, right   Treatment diagnosis? (if different than referring diagnosis) R27.8   What was this (referring dx) caused by? Fall and Ongoing Issue  Lysle of Condition: Chronic (continuous duration > 3 months)   Laterality: Rt  Current Functional Measure Score: Patient Specific Functional Scale 5  Objective measurements identify impairments when they are compared to normal values, the uninvolved extremity, and prior level of function.  [x]  Yes  []  No  Objective assessment of functional ability: Minimal functional limitations   Briefly describe symptoms: Weakness through  the radial nerve distribution of the right arm, paresthesia and numbness and decreased coordination in the right hand  How did symptoms start: Radial nerve irritation seems to have started in 2023 after breaking the elbow, decreased coordination and strength in the hand is likely from multiple sclerosis and degeneration over time.  Average pain intensity:  Last 24 hours: 0-1/10  Past week: 0-1/10  How often does the pt experience symptoms? Constantly  How much have the symptoms interfered with usual daily activities? A little bit  How has condition changed since care began at this facility? NA - initial visit  In general, how is the patients overall health? Good   BACK PAIN (STarT Back Screening Tool) No

## 2024-02-15 ENCOUNTER — Encounter: Payer: Self-pay | Admitting: Rehabilitative and Restorative Service Providers"

## 2024-02-15 ENCOUNTER — Encounter: Payer: Self-pay | Admitting: Radiology

## 2024-02-15 ENCOUNTER — Ambulatory Visit: Admitting: Rehabilitative and Restorative Service Providers"

## 2024-02-15 DIAGNOSIS — M25531 Pain in right wrist: Secondary | ICD-10-CM | POA: Diagnosis not present

## 2024-02-15 DIAGNOSIS — M6281 Muscle weakness (generalized): Secondary | ICD-10-CM

## 2024-02-15 DIAGNOSIS — R278 Other lack of coordination: Secondary | ICD-10-CM

## 2024-02-15 DIAGNOSIS — M25621 Stiffness of right elbow, not elsewhere classified: Secondary | ICD-10-CM

## 2024-02-24 ENCOUNTER — Other Ambulatory Visit

## 2024-02-24 DIAGNOSIS — Z006 Encounter for examination for normal comparison and control in clinical research program: Secondary | ICD-10-CM

## 2024-02-25 NOTE — Addendum Note (Signed)
 Addended by: BILLIE GRAYCE POUR on: 02/25/2024 09:57 AM   Modules accepted: Orders

## 2024-03-07 LAB — GENECONNECT MOLECULAR SCREEN: Genetic Analysis Overall Interpretation: NEGATIVE

## 2024-10-04 ENCOUNTER — Ambulatory Visit: Payer: Self-pay

## 2024-11-01 ENCOUNTER — Encounter: Payer: Self-pay | Admitting: Family Medicine
